# Patient Record
Sex: Female | Born: 1945 | Race: White | Hispanic: No | Marital: Married | Smoking: Former smoker
Health system: Southern US, Community
[De-identification: ages and names within clinical notes are randomized; demographics above are authoritative.]

## PROBLEM LIST (undated history)

## (undated) DIAGNOSIS — K5730 Diverticulosis of large intestine without perforation or abscess without bleeding: Secondary | ICD-10-CM

## (undated) DIAGNOSIS — I10 Essential (primary) hypertension: Secondary | ICD-10-CM

## (undated) DIAGNOSIS — S2249XA Multiple fractures of ribs, unspecified side, initial encounter for closed fracture: Secondary | ICD-10-CM

## (undated) DIAGNOSIS — F329 Major depressive disorder, single episode, unspecified: Secondary | ICD-10-CM

## (undated) DIAGNOSIS — S82891A Other fracture of right lower leg, initial encounter for closed fracture: Secondary | ICD-10-CM

## (undated) DIAGNOSIS — S82892A Other fracture of left lower leg, initial encounter for closed fracture: Secondary | ICD-10-CM

## (undated) DIAGNOSIS — R569 Unspecified convulsions: Secondary | ICD-10-CM

## (undated) DIAGNOSIS — F419 Anxiety disorder, unspecified: Secondary | ICD-10-CM

## (undated) DIAGNOSIS — F32A Depression, unspecified: Secondary | ICD-10-CM

## (undated) DIAGNOSIS — K219 Gastro-esophageal reflux disease without esophagitis: Secondary | ICD-10-CM

## (undated) DIAGNOSIS — M1990 Unspecified osteoarthritis, unspecified site: Secondary | ICD-10-CM

## (undated) HISTORY — PX: ABDOMINAL HYSTERECTOMY: SHX81

## (undated) HISTORY — PX: TUBAL LIGATION: SHX77

## (undated) HISTORY — PX: PARTIAL HYSTERECTOMY: SHX80

## (undated) HISTORY — PX: CHOLECYSTECTOMY: SHX55

---

## 2011-08-29 DIAGNOSIS — I10 Essential (primary) hypertension: Secondary | ICD-10-CM | POA: Diagnosis not present

## 2011-08-29 DIAGNOSIS — E785 Hyperlipidemia, unspecified: Secondary | ICD-10-CM | POA: Diagnosis not present

## 2011-08-29 DIAGNOSIS — R0602 Shortness of breath: Secondary | ICD-10-CM | POA: Diagnosis not present

## 2011-08-29 DIAGNOSIS — F17200 Nicotine dependence, unspecified, uncomplicated: Secondary | ICD-10-CM | POA: Diagnosis not present

## 2011-08-29 DIAGNOSIS — F341 Dysthymic disorder: Secondary | ICD-10-CM | POA: Diagnosis not present

## 2011-10-01 DIAGNOSIS — F341 Dysthymic disorder: Secondary | ICD-10-CM | POA: Diagnosis not present

## 2011-10-01 DIAGNOSIS — N318 Other neuromuscular dysfunction of bladder: Secondary | ICD-10-CM | POA: Diagnosis not present

## 2011-10-01 DIAGNOSIS — I10 Essential (primary) hypertension: Secondary | ICD-10-CM | POA: Diagnosis not present

## 2011-10-01 DIAGNOSIS — R634 Abnormal weight loss: Secondary | ICD-10-CM | POA: Diagnosis not present

## 2011-10-01 DIAGNOSIS — E039 Hypothyroidism, unspecified: Secondary | ICD-10-CM | POA: Diagnosis not present

## 2011-10-15 DIAGNOSIS — F605 Obsessive-compulsive personality disorder: Secondary | ICD-10-CM | POA: Diagnosis not present

## 2011-10-15 DIAGNOSIS — I10 Essential (primary) hypertension: Secondary | ICD-10-CM | POA: Diagnosis not present

## 2011-10-15 DIAGNOSIS — E785 Hyperlipidemia, unspecified: Secondary | ICD-10-CM | POA: Diagnosis not present

## 2012-04-07 DIAGNOSIS — M545 Low back pain: Secondary | ICD-10-CM | POA: Diagnosis not present

## 2012-04-07 DIAGNOSIS — I10 Essential (primary) hypertension: Secondary | ICD-10-CM | POA: Diagnosis not present

## 2012-04-07 DIAGNOSIS — N390 Urinary tract infection, site not specified: Secondary | ICD-10-CM | POA: Diagnosis not present

## 2012-04-07 DIAGNOSIS — B20 Human immunodeficiency virus [HIV] disease: Secondary | ICD-10-CM | POA: Diagnosis not present

## 2012-04-07 DIAGNOSIS — R0602 Shortness of breath: Secondary | ICD-10-CM | POA: Diagnosis not present

## 2012-04-18 DIAGNOSIS — R7309 Other abnormal glucose: Secondary | ICD-10-CM | POA: Diagnosis not present

## 2012-04-24 DIAGNOSIS — N390 Urinary tract infection, site not specified: Secondary | ICD-10-CM | POA: Diagnosis not present

## 2012-04-24 DIAGNOSIS — I10 Essential (primary) hypertension: Secondary | ICD-10-CM | POA: Diagnosis not present

## 2012-05-02 DIAGNOSIS — J984 Other disorders of lung: Secondary | ICD-10-CM | POA: Diagnosis not present

## 2012-05-02 DIAGNOSIS — M47817 Spondylosis without myelopathy or radiculopathy, lumbosacral region: Secondary | ICD-10-CM | POA: Diagnosis not present

## 2012-05-02 DIAGNOSIS — M5137 Other intervertebral disc degeneration, lumbosacral region: Secondary | ICD-10-CM | POA: Diagnosis not present

## 2012-05-02 DIAGNOSIS — R52 Pain, unspecified: Secondary | ICD-10-CM | POA: Diagnosis not present

## 2012-05-02 DIAGNOSIS — Z1231 Encounter for screening mammogram for malignant neoplasm of breast: Secondary | ICD-10-CM | POA: Diagnosis not present

## 2012-05-02 DIAGNOSIS — M25559 Pain in unspecified hip: Secondary | ICD-10-CM | POA: Diagnosis not present

## 2012-05-02 DIAGNOSIS — M1710 Unilateral primary osteoarthritis, unspecified knee: Secondary | ICD-10-CM | POA: Diagnosis not present

## 2012-05-02 DIAGNOSIS — R05 Cough: Secondary | ICD-10-CM | POA: Diagnosis not present

## 2012-05-02 DIAGNOSIS — I10 Essential (primary) hypertension: Secondary | ICD-10-CM | POA: Diagnosis not present

## 2012-05-02 DIAGNOSIS — M25569 Pain in unspecified knee: Secondary | ICD-10-CM | POA: Diagnosis not present

## 2012-07-01 DIAGNOSIS — J449 Chronic obstructive pulmonary disease, unspecified: Secondary | ICD-10-CM | POA: Diagnosis not present

## 2012-07-01 DIAGNOSIS — Z79899 Other long term (current) drug therapy: Secondary | ICD-10-CM | POA: Diagnosis not present

## 2012-07-01 DIAGNOSIS — F329 Major depressive disorder, single episode, unspecified: Secondary | ICD-10-CM | POA: Diagnosis not present

## 2012-07-01 DIAGNOSIS — Z5181 Encounter for therapeutic drug level monitoring: Secondary | ICD-10-CM | POA: Diagnosis not present

## 2012-07-01 DIAGNOSIS — J329 Chronic sinusitis, unspecified: Secondary | ICD-10-CM | POA: Diagnosis not present

## 2012-10-02 DIAGNOSIS — K089 Disorder of teeth and supporting structures, unspecified: Secondary | ICD-10-CM | POA: Diagnosis not present

## 2013-01-15 DIAGNOSIS — Z1329 Encounter for screening for other suspected endocrine disorder: Secondary | ICD-10-CM | POA: Diagnosis not present

## 2013-01-15 DIAGNOSIS — E785 Hyperlipidemia, unspecified: Secondary | ICD-10-CM | POA: Diagnosis not present

## 2013-01-15 DIAGNOSIS — J449 Chronic obstructive pulmonary disease, unspecified: Secondary | ICD-10-CM | POA: Diagnosis not present

## 2013-01-15 DIAGNOSIS — C8589 Other specified types of non-Hodgkin lymphoma, extranodal and solid organ sites: Secondary | ICD-10-CM | POA: Diagnosis not present

## 2013-01-15 DIAGNOSIS — E559 Vitamin D deficiency, unspecified: Secondary | ICD-10-CM | POA: Diagnosis not present

## 2013-01-15 DIAGNOSIS — R5381 Other malaise: Secondary | ICD-10-CM | POA: Diagnosis not present

## 2013-01-15 DIAGNOSIS — I10 Essential (primary) hypertension: Secondary | ICD-10-CM | POA: Diagnosis not present

## 2013-01-20 DIAGNOSIS — D179 Benign lipomatous neoplasm, unspecified: Secondary | ICD-10-CM | POA: Diagnosis not present

## 2013-01-21 DIAGNOSIS — K219 Gastro-esophageal reflux disease without esophagitis: Secondary | ICD-10-CM | POA: Diagnosis not present

## 2013-01-21 DIAGNOSIS — I10 Essential (primary) hypertension: Secondary | ICD-10-CM | POA: Diagnosis not present

## 2013-01-21 DIAGNOSIS — F17200 Nicotine dependence, unspecified, uncomplicated: Secondary | ICD-10-CM | POA: Diagnosis not present

## 2013-01-21 DIAGNOSIS — Z885 Allergy status to narcotic agent status: Secondary | ICD-10-CM | POA: Diagnosis not present

## 2013-01-21 DIAGNOSIS — D1739 Benign lipomatous neoplasm of skin and subcutaneous tissue of other sites: Secondary | ICD-10-CM | POA: Diagnosis not present

## 2013-01-21 DIAGNOSIS — Z9089 Acquired absence of other organs: Secondary | ICD-10-CM | POA: Diagnosis not present

## 2013-01-21 DIAGNOSIS — Z01818 Encounter for other preprocedural examination: Secondary | ICD-10-CM | POA: Diagnosis not present

## 2013-01-21 DIAGNOSIS — Z9079 Acquired absence of other genital organ(s): Secondary | ICD-10-CM | POA: Diagnosis not present

## 2013-01-21 DIAGNOSIS — Z886 Allergy status to analgesic agent status: Secondary | ICD-10-CM | POA: Diagnosis not present

## 2013-01-21 DIAGNOSIS — Z8659 Personal history of other mental and behavioral disorders: Secondary | ICD-10-CM | POA: Diagnosis not present

## 2013-01-23 DIAGNOSIS — F17200 Nicotine dependence, unspecified, uncomplicated: Secondary | ICD-10-CM | POA: Diagnosis not present

## 2013-01-23 DIAGNOSIS — D179 Benign lipomatous neoplasm, unspecified: Secondary | ICD-10-CM | POA: Diagnosis not present

## 2013-01-23 DIAGNOSIS — K219 Gastro-esophageal reflux disease without esophagitis: Secondary | ICD-10-CM | POA: Diagnosis not present

## 2013-01-23 DIAGNOSIS — I10 Essential (primary) hypertension: Secondary | ICD-10-CM | POA: Diagnosis not present

## 2013-01-23 DIAGNOSIS — Z9089 Acquired absence of other organs: Secondary | ICD-10-CM | POA: Diagnosis not present

## 2013-01-23 DIAGNOSIS — Z8659 Personal history of other mental and behavioral disorders: Secondary | ICD-10-CM | POA: Diagnosis not present

## 2013-01-23 DIAGNOSIS — D1739 Benign lipomatous neoplasm of skin and subcutaneous tissue of other sites: Secondary | ICD-10-CM | POA: Diagnosis not present

## 2013-01-23 DIAGNOSIS — D2370 Other benign neoplasm of skin of unspecified lower limb, including hip: Secondary | ICD-10-CM | POA: Diagnosis not present

## 2013-01-23 DIAGNOSIS — D1779 Benign lipomatous neoplasm of other sites: Secondary | ICD-10-CM | POA: Diagnosis not present

## 2013-01-24 DIAGNOSIS — I10 Essential (primary) hypertension: Secondary | ICD-10-CM | POA: Diagnosis not present

## 2013-01-24 DIAGNOSIS — Z9089 Acquired absence of other organs: Secondary | ICD-10-CM | POA: Diagnosis not present

## 2013-01-24 DIAGNOSIS — D1739 Benign lipomatous neoplasm of skin and subcutaneous tissue of other sites: Secondary | ICD-10-CM | POA: Diagnosis not present

## 2013-01-24 DIAGNOSIS — K219 Gastro-esophageal reflux disease without esophagitis: Secondary | ICD-10-CM | POA: Diagnosis not present

## 2013-01-24 DIAGNOSIS — Z8659 Personal history of other mental and behavioral disorders: Secondary | ICD-10-CM | POA: Diagnosis not present

## 2013-01-24 DIAGNOSIS — F17200 Nicotine dependence, unspecified, uncomplicated: Secondary | ICD-10-CM | POA: Diagnosis not present

## 2013-02-18 DIAGNOSIS — I270 Primary pulmonary hypertension: Secondary | ICD-10-CM | POA: Diagnosis not present

## 2013-02-18 DIAGNOSIS — I517 Cardiomegaly: Secondary | ICD-10-CM | POA: Diagnosis not present

## 2013-04-06 DIAGNOSIS — I209 Angina pectoris, unspecified: Secondary | ICD-10-CM | POA: Diagnosis not present

## 2013-04-08 DIAGNOSIS — R079 Chest pain, unspecified: Secondary | ICD-10-CM | POA: Diagnosis not present

## 2013-04-08 DIAGNOSIS — R0602 Shortness of breath: Secondary | ICD-10-CM | POA: Diagnosis not present

## 2013-04-08 DIAGNOSIS — I209 Angina pectoris, unspecified: Secondary | ICD-10-CM | POA: Diagnosis not present

## 2013-04-08 DIAGNOSIS — A499 Bacterial infection, unspecified: Secondary | ICD-10-CM | POA: Diagnosis not present

## 2013-04-09 DIAGNOSIS — I209 Angina pectoris, unspecified: Secondary | ICD-10-CM | POA: Diagnosis not present

## 2013-04-09 DIAGNOSIS — R0602 Shortness of breath: Secondary | ICD-10-CM | POA: Diagnosis not present

## 2013-04-09 DIAGNOSIS — A499 Bacterial infection, unspecified: Secondary | ICD-10-CM | POA: Diagnosis not present

## 2013-04-14 DIAGNOSIS — Z79899 Other long term (current) drug therapy: Secondary | ICD-10-CM | POA: Diagnosis not present

## 2013-04-14 DIAGNOSIS — F17200 Nicotine dependence, unspecified, uncomplicated: Secondary | ICD-10-CM | POA: Diagnosis not present

## 2013-04-14 DIAGNOSIS — I209 Angina pectoris, unspecified: Secondary | ICD-10-CM | POA: Diagnosis not present

## 2013-04-14 DIAGNOSIS — I10 Essential (primary) hypertension: Secondary | ICD-10-CM | POA: Diagnosis not present

## 2013-04-14 DIAGNOSIS — E785 Hyperlipidemia, unspecified: Secondary | ICD-10-CM | POA: Diagnosis not present

## 2013-04-14 DIAGNOSIS — I2510 Atherosclerotic heart disease of native coronary artery without angina pectoris: Secondary | ICD-10-CM | POA: Diagnosis not present

## 2013-04-14 DIAGNOSIS — J449 Chronic obstructive pulmonary disease, unspecified: Secondary | ICD-10-CM | POA: Diagnosis not present

## 2013-04-14 DIAGNOSIS — R079 Chest pain, unspecified: Secondary | ICD-10-CM | POA: Diagnosis not present

## 2013-04-14 DIAGNOSIS — R0602 Shortness of breath: Secondary | ICD-10-CM | POA: Diagnosis not present

## 2013-05-18 DIAGNOSIS — I10 Essential (primary) hypertension: Secondary | ICD-10-CM | POA: Diagnosis not present

## 2013-05-18 DIAGNOSIS — F411 Generalized anxiety disorder: Secondary | ICD-10-CM | POA: Diagnosis not present

## 2013-05-18 DIAGNOSIS — Z23 Encounter for immunization: Secondary | ICD-10-CM | POA: Diagnosis not present

## 2013-06-29 DIAGNOSIS — R0789 Other chest pain: Secondary | ICD-10-CM | POA: Diagnosis not present

## 2013-06-29 DIAGNOSIS — I2510 Atherosclerotic heart disease of native coronary artery without angina pectoris: Secondary | ICD-10-CM | POA: Diagnosis not present

## 2013-08-27 DIAGNOSIS — R300 Dysuria: Secondary | ICD-10-CM | POA: Diagnosis not present

## 2013-08-27 DIAGNOSIS — I10 Essential (primary) hypertension: Secondary | ICD-10-CM | POA: Diagnosis not present

## 2013-08-27 DIAGNOSIS — E785 Hyperlipidemia, unspecified: Secondary | ICD-10-CM | POA: Diagnosis not present

## 2013-08-27 DIAGNOSIS — R5381 Other malaise: Secondary | ICD-10-CM | POA: Diagnosis not present

## 2013-08-27 DIAGNOSIS — E559 Vitamin D deficiency, unspecified: Secondary | ICD-10-CM | POA: Diagnosis not present

## 2013-08-27 DIAGNOSIS — Z1329 Encounter for screening for other suspected endocrine disorder: Secondary | ICD-10-CM | POA: Diagnosis not present

## 2013-10-28 DIAGNOSIS — Z6825 Body mass index (BMI) 25.0-25.9, adult: Secondary | ICD-10-CM | POA: Diagnosis not present

## 2013-10-28 DIAGNOSIS — I10 Essential (primary) hypertension: Secondary | ICD-10-CM | POA: Diagnosis not present

## 2013-10-28 DIAGNOSIS — F329 Major depressive disorder, single episode, unspecified: Secondary | ICD-10-CM | POA: Diagnosis not present

## 2013-10-28 DIAGNOSIS — E785 Hyperlipidemia, unspecified: Secondary | ICD-10-CM | POA: Diagnosis not present

## 2014-06-04 ENCOUNTER — Emergency Department (HOSPITAL_COMMUNITY): Payer: Medicare Other

## 2014-06-04 ENCOUNTER — Emergency Department (HOSPITAL_COMMUNITY)
Admission: EM | Admit: 2014-06-04 | Discharge: 2014-06-04 | Disposition: A | Discharge status: Home / Self Care | Payer: Medicare Other | Attending: Emergency Medicine | Admitting: Emergency Medicine

## 2014-06-04 ENCOUNTER — Encounter (HOSPITAL_COMMUNITY): Payer: Self-pay | Admitting: Emergency Medicine

## 2014-06-04 DIAGNOSIS — W1849XA Other slipping, tripping and stumbling without falling, initial encounter: Secondary | ICD-10-CM | POA: Insufficient documentation

## 2014-06-04 DIAGNOSIS — S62309A Unspecified fracture of unspecified metacarpal bone, initial encounter for closed fracture: Secondary | ICD-10-CM

## 2014-06-04 DIAGNOSIS — I10 Essential (primary) hypertension: Secondary | ICD-10-CM | POA: Diagnosis not present

## 2014-06-04 DIAGNOSIS — Y929 Unspecified place or not applicable: Secondary | ICD-10-CM | POA: Diagnosis not present

## 2014-06-04 DIAGNOSIS — Z720 Tobacco use: Secondary | ICD-10-CM | POA: Diagnosis not present

## 2014-06-04 DIAGNOSIS — S62615A Displaced fracture of proximal phalanx of left ring finger, initial encounter for closed fracture: Secondary | ICD-10-CM | POA: Insufficient documentation

## 2014-06-04 DIAGNOSIS — S62609A Fracture of unspecified phalanx of unspecified finger, initial encounter for closed fracture: Secondary | ICD-10-CM

## 2014-06-04 DIAGNOSIS — Z8719 Personal history of other diseases of the digestive system: Secondary | ICD-10-CM | POA: Diagnosis not present

## 2014-06-04 DIAGNOSIS — M79641 Pain in right hand: Secondary | ICD-10-CM | POA: Insufficient documentation

## 2014-06-04 DIAGNOSIS — S6991XA Unspecified injury of right wrist, hand and finger(s), initial encounter: Secondary | ICD-10-CM | POA: Diagnosis not present

## 2014-06-04 DIAGNOSIS — Y9389 Activity, other specified: Secondary | ICD-10-CM | POA: Insufficient documentation

## 2014-06-04 DIAGNOSIS — Z8739 Personal history of other diseases of the musculoskeletal system and connective tissue: Secondary | ICD-10-CM | POA: Diagnosis not present

## 2014-06-04 DIAGNOSIS — S62614A Displaced fracture of proximal phalanx of right ring finger, initial encounter for closed fracture: Secondary | ICD-10-CM | POA: Diagnosis not present

## 2014-06-04 DIAGNOSIS — M79644 Pain in right finger(s): Secondary | ICD-10-CM | POA: Insufficient documentation

## 2014-06-04 DIAGNOSIS — S62314A Displaced fracture of base of fourth metacarpal bone, right hand, initial encounter for closed fracture: Secondary | ICD-10-CM | POA: Diagnosis not present

## 2014-06-04 DIAGNOSIS — Z8659 Personal history of other mental and behavioral disorders: Secondary | ICD-10-CM | POA: Insufficient documentation

## 2014-06-04 DIAGNOSIS — T1490XA Injury, unspecified, initial encounter: Secondary | ICD-10-CM

## 2014-06-04 HISTORY — DX: Anxiety disorder, unspecified: F41.9

## 2014-06-04 HISTORY — DX: Gastro-esophageal reflux disease without esophagitis: K21.9

## 2014-06-04 HISTORY — DX: Essential (primary) hypertension: I10

## 2014-06-04 HISTORY — DX: Diverticulosis of large intestine without perforation or abscess without bleeding: K57.30

## 2014-06-04 HISTORY — DX: Unspecified osteoarthritis, unspecified site: M19.90

## 2014-06-04 MED ORDER — HYDROCODONE-ACETAMINOPHEN 5-325 MG PO TABS: ORAL_TABLET | ORAL | Status: DC

## 2014-06-04 MED ORDER — HYDROCODONE-ACETAMINOPHEN 5-325 MG PO TABS
1.0000 | ORAL_TABLET | Freq: Once | ORAL | Status: AC
  Administered 2014-06-04: 1 via ORAL
  Filled 2014-06-04: qty 1

## 2014-06-04 NOTE — Discharge Instructions (Signed)
Hand Fracture, Metacarpals °Fractures of metacarpals are breaks in the bones of the hand. They extend from the knuckles to the wrist. These bones can undergo many types of fractures. There are different ways of treating these fractures, all of which may be correct. °TREATMENT  °Hand fractures can be treated with:  °· Non-reduction - The fracture is casted without changing the positions of the fracture (bone pieces) involved. This fracture is usually left in a cast for 4 to 6 weeks or as your caregiver thinks necessary. °· Closed reduction - The bones are moved back into position without surgery and then casted. °· ORIF (open reduction and internal fixation) - The fracture site is opened and the bone pieces are fixed into place with some type of hardware, such as screws, etc. They are then casted. °Your caregiver will discuss the type of fracture you have and the treatment that should be best for that problem. If surgery is chosen, let your caregivers know about the following.  °LET YOUR CAREGIVERS KNOW ABOUT: °· Allergies. °· Medications you are taking, including herbs, eye drops, over the counter medications, and creams. °· Use of steroids (by mouth or creams). °· Previous problems with anesthetics or novocaine. °· Possibility of pregnancy. °· History of blood clots (thrombophlebitis). °· History of bleeding or blood problems. °· Previous surgeries. °· Other health problems. °AFTER THE PROCEDURE °After surgery, you will be taken to the recovery area where a nurse will watch and check your progress. Once you are awake, stable, and taking fluids well, barring other problems, you'll be allowed to go home. Once home, an ice pack applied to your operative site may help with pain and keep the swelling down. °HOME CARE INSTRUCTIONS  °· Follow your caregiver's instructions as to activities, exercises, physical therapy, and driving a car. °· Daily exercise is helpful for keeping range of motion and strength. Exercise as  instructed. °· To lessen swelling, keep the injured hand elevated above the level of your heart as much as possible. °· Apply ice to the injury for 15-20 minutes each hour while awake for the first 2 days. Put the ice in a plastic bag and place a thin towel between the bag of ice and your cast. °· Move the fingers of your casted hand several times a day. °· If a plaster or fiberglass cast was applied: °¨ Do not try to scratch the skin under the cast using a sharp or pointed object. °¨ Check the skin around the cast every day. You may put lotion on red or sore areas. °¨ Keep your cast dry. Your cast can be protected during bathing with a plastic bag. Do not put your cast into the water. °· If a plaster splint was applied: °¨ Wear your splint for as long as directed by your caregiver or until seen again. °¨ Do not get your splint wet. Protect it during bathing with a plastic bag. °¨ You may loosen the elastic bandage around the splint if your fingers start to get numb, tingle, get cold or turn blue. °· Do not put pressure on your cast or splint; this may cause it to break. Especially, do not lean plaster casts on hard surfaces for 24 hours after application. °· Take medications as directed by your caregiver. °· Only take over-the-counter or prescription medicines for pain, discomfort, or fever as directed by your caregiver. °· Follow-up as provided by your caregiver. This is very important in order to avoid permanent injury or disability and chronic   pain. °SEEK MEDICAL CARE IF:  °· Increased bleeding (more than a small spot) from beneath your cast or splint if there is beneath the cast as with an open reduction. °· Redness, swelling, or increasing pain in the wound or from beneath your cast or splint. °· Pus coming from wound or from beneath your cast or splint. °· An unexplained oral temperature above 102° F (38.9° C) develops, or as your caregiver suggests. °· A foul smell coming from the wound or dressing or from  beneath your cast or splint. °· You have a problem moving any of your fingers. °SEEK IMMEDIATE MEDICAL CARE IF:  °· You develop a rash °· You have difficulty breathing °· You have any allergy problems °If you do not have a window in your cast for observing the wound, a discharge or minor bleeding may show up as a stain on the outside of your cast. Report these findings to your caregiver. °MAKE SURE YOU:  °· Understand these instructions. °· Will watch your condition. °· Will get help right away if you are not doing well or get worse. °Document Released: 07/30/2005 Document Revised: 10/22/2011 Document Reviewed: 03/18/2008 °[employer]® Patient Information ©2015 [employer]. This information is not intended to replace advice given to you by your health care provider. Make sure you discuss any questions you have with your health care provider. ° °

## 2014-06-04 NOTE — ED Notes (Signed)
Patient given discharge instruction, verbalized understand. Patient ambulatory out of the department.  
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
## 2014-06-04 NOTE — ED Notes (Signed)
Pt tripped and fell over threshold early today.

## 2014-06-04 NOTE — ED Notes (Signed)
Pt tripped and fell, injured digits 3-5 on rt hand, pt denies injury elsewhere.

## 2014-06-08 DIAGNOSIS — S62324A Displaced fracture of shaft of fourth metacarpal bone, right hand, initial encounter for closed fracture: Secondary | ICD-10-CM | POA: Diagnosis not present

## 2014-06-08 DIAGNOSIS — S62614A Displaced fracture of proximal phalanx of right ring finger, initial encounter for closed fracture: Secondary | ICD-10-CM | POA: Diagnosis not present

## 2014-06-08 NOTE — ED Provider Notes (Signed)
Medical screening examination/treatment/procedure(s) were performed by non-physician practitioner and as supervising physician I was immediately available for consultation/collaboration.   EKG Interpretation None        Maudry Diego, MD 06/08/14 [phone]

## 2014-06-14 DIAGNOSIS — Y929 Unspecified place or not applicable: Secondary | ICD-10-CM | POA: Diagnosis not present

## 2014-06-14 DIAGNOSIS — S62614A Displaced fracture of proximal phalanx of right ring finger, initial encounter for closed fracture: Secondary | ICD-10-CM | POA: Diagnosis not present

## 2014-06-14 DIAGNOSIS — S62324A Displaced fracture of shaft of fourth metacarpal bone, right hand, initial encounter for closed fracture: Secondary | ICD-10-CM | POA: Diagnosis not present

## 2014-06-14 DIAGNOSIS — S62304A Unspecified fracture of fourth metacarpal bone, right hand, initial encounter for closed fracture: Secondary | ICD-10-CM | POA: Diagnosis not present

## 2014-06-14 DIAGNOSIS — X58XXXA Exposure to other specified factors, initial encounter: Secondary | ICD-10-CM | POA: Diagnosis not present

## 2014-06-21 DIAGNOSIS — S62614A Displaced fracture of proximal phalanx of right ring finger, initial encounter for closed fracture: Secondary | ICD-10-CM | POA: Diagnosis not present

## 2014-06-21 DIAGNOSIS — S62324A Displaced fracture of shaft of fourth metacarpal bone, right hand, initial encounter for closed fracture: Secondary | ICD-10-CM | POA: Diagnosis not present

## 2014-06-23 DIAGNOSIS — I10 Essential (primary) hypertension: Secondary | ICD-10-CM | POA: Diagnosis not present

## 2014-06-23 DIAGNOSIS — F17200 Nicotine dependence, unspecified, uncomplicated: Secondary | ICD-10-CM | POA: Diagnosis not present

## 2014-06-23 DIAGNOSIS — Z23 Encounter for immunization: Secondary | ICD-10-CM | POA: Diagnosis not present

## 2014-06-23 DIAGNOSIS — E782 Mixed hyperlipidemia: Secondary | ICD-10-CM | POA: Diagnosis not present

## 2014-06-23 DIAGNOSIS — Z6827 Body mass index (BMI) 27.0-27.9, adult: Secondary | ICD-10-CM | POA: Diagnosis not present

## 2014-07-20 DIAGNOSIS — S62614D Displaced fracture of proximal phalanx of right ring finger, subsequent encounter for fracture with routine healing: Secondary | ICD-10-CM | POA: Diagnosis not present

## 2014-07-20 DIAGNOSIS — S62324D Displaced fracture of shaft of fourth metacarpal bone, right hand, subsequent encounter for fracture with routine healing: Secondary | ICD-10-CM | POA: Diagnosis not present

## 2014-09-14 DIAGNOSIS — Z6828 Body mass index (BMI) 28.0-28.9, adult: Secondary | ICD-10-CM | POA: Diagnosis not present

## 2014-09-14 DIAGNOSIS — R350 Frequency of micturition: Secondary | ICD-10-CM | POA: Diagnosis not present

## 2014-09-14 DIAGNOSIS — J329 Chronic sinusitis, unspecified: Secondary | ICD-10-CM | POA: Diagnosis not present

## 2014-09-14 DIAGNOSIS — E663 Overweight: Secondary | ICD-10-CM | POA: Diagnosis not present

## 2015-01-31 ENCOUNTER — Other Ambulatory Visit (HOSPITAL_COMMUNITY): Payer: Self-pay | Admitting: Family Medicine

## 2015-01-31 DIAGNOSIS — Z6828 Body mass index (BMI) 28.0-28.9, adult: Secondary | ICD-10-CM | POA: Diagnosis not present

## 2015-01-31 DIAGNOSIS — F329 Major depressive disorder, single episode, unspecified: Secondary | ICD-10-CM | POA: Diagnosis not present

## 2015-01-31 DIAGNOSIS — I10 Essential (primary) hypertension: Secondary | ICD-10-CM | POA: Diagnosis not present

## 2015-01-31 DIAGNOSIS — F419 Anxiety disorder, unspecified: Secondary | ICD-10-CM | POA: Diagnosis not present

## 2015-01-31 DIAGNOSIS — E663 Overweight: Secondary | ICD-10-CM | POA: Diagnosis not present

## 2015-02-01 ENCOUNTER — Other Ambulatory Visit (HOSPITAL_COMMUNITY): Payer: Self-pay | Admitting: Family Medicine

## 2015-02-01 DIAGNOSIS — Z1231 Encounter for screening mammogram for malignant neoplasm of breast: Secondary | ICD-10-CM

## 2015-02-02 ENCOUNTER — Other Ambulatory Visit (HOSPITAL_COMMUNITY): Payer: Self-pay | Admitting: Family Medicine

## 2015-02-02 DIAGNOSIS — M8580 Other specified disorders of bone density and structure, unspecified site: Secondary | ICD-10-CM

## 2015-02-09 ENCOUNTER — Ambulatory Visit (HOSPITAL_COMMUNITY)
Admission: RE | Admit: 2015-02-09 | Discharge: 2015-02-09 | Disposition: A | Discharge status: Home / Self Care | Payer: Medicare Other | Source: Ambulatory Visit | Attending: Family Medicine | Admitting: Family Medicine

## 2015-02-09 DIAGNOSIS — M8580 Other specified disorders of bone density and structure, unspecified site: Secondary | ICD-10-CM

## 2015-02-09 DIAGNOSIS — Z780 Asymptomatic menopausal state: Secondary | ICD-10-CM | POA: Insufficient documentation

## 2015-02-09 DIAGNOSIS — R29890 Loss of height: Secondary | ICD-10-CM | POA: Diagnosis not present

## 2015-02-09 DIAGNOSIS — Z1231 Encounter for screening mammogram for malignant neoplasm of breast: Secondary | ICD-10-CM | POA: Diagnosis present

## 2015-02-09 DIAGNOSIS — M898X8 Other specified disorders of bone, other site: Secondary | ICD-10-CM | POA: Diagnosis not present

## 2015-02-09 DIAGNOSIS — M810 Age-related osteoporosis without current pathological fracture: Secondary | ICD-10-CM | POA: Diagnosis not present

## 2015-03-22 DIAGNOSIS — H2513 Age-related nuclear cataract, bilateral: Secondary | ICD-10-CM | POA: Diagnosis not present

## 2015-03-22 DIAGNOSIS — H40033 Anatomical narrow angle, bilateral: Secondary | ICD-10-CM | POA: Diagnosis not present

## 2015-05-06 DIAGNOSIS — E785 Hyperlipidemia, unspecified: Secondary | ICD-10-CM | POA: Diagnosis not present

## 2015-05-06 DIAGNOSIS — F419 Anxiety disorder, unspecified: Secondary | ICD-10-CM | POA: Diagnosis not present

## 2015-05-06 DIAGNOSIS — I10 Essential (primary) hypertension: Secondary | ICD-10-CM | POA: Diagnosis not present

## 2015-05-06 DIAGNOSIS — E663 Overweight: Secondary | ICD-10-CM | POA: Diagnosis not present

## 2015-05-06 DIAGNOSIS — Z1389 Encounter for screening for other disorder: Secondary | ICD-10-CM | POA: Diagnosis not present

## 2015-05-06 DIAGNOSIS — E782 Mixed hyperlipidemia: Secondary | ICD-10-CM | POA: Diagnosis not present

## 2015-05-06 DIAGNOSIS — R739 Hyperglycemia, unspecified: Secondary | ICD-10-CM | POA: Diagnosis not present

## 2015-05-06 DIAGNOSIS — F329 Major depressive disorder, single episode, unspecified: Secondary | ICD-10-CM | POA: Diagnosis not present

## 2015-05-06 DIAGNOSIS — Z6829 Body mass index (BMI) 29.0-29.9, adult: Secondary | ICD-10-CM | POA: Diagnosis not present

## 2015-07-28 DIAGNOSIS — Z1389 Encounter for screening for other disorder: Secondary | ICD-10-CM | POA: Diagnosis not present

## 2015-07-28 DIAGNOSIS — F419 Anxiety disorder, unspecified: Secondary | ICD-10-CM | POA: Diagnosis not present

## 2015-07-28 DIAGNOSIS — Z6830 Body mass index (BMI) 30.0-30.9, adult: Secondary | ICD-10-CM | POA: Diagnosis not present

## 2015-07-28 DIAGNOSIS — I10 Essential (primary) hypertension: Secondary | ICD-10-CM | POA: Diagnosis not present

## 2015-10-26 DIAGNOSIS — Z1389 Encounter for screening for other disorder: Secondary | ICD-10-CM | POA: Diagnosis not present

## 2015-10-26 DIAGNOSIS — E6609 Other obesity due to excess calories: Secondary | ICD-10-CM | POA: Diagnosis not present

## 2015-10-26 DIAGNOSIS — Z6831 Body mass index (BMI) 31.0-31.9, adult: Secondary | ICD-10-CM | POA: Diagnosis not present

## 2015-10-26 DIAGNOSIS — Z0000 Encounter for general adult medical examination without abnormal findings: Secondary | ICD-10-CM | POA: Diagnosis not present

## 2016-01-27 DIAGNOSIS — F419 Anxiety disorder, unspecified: Secondary | ICD-10-CM | POA: Diagnosis not present

## 2016-01-27 DIAGNOSIS — Z6831 Body mass index (BMI) 31.0-31.9, adult: Secondary | ICD-10-CM | POA: Diagnosis not present

## 2016-01-27 DIAGNOSIS — F329 Major depressive disorder, single episode, unspecified: Secondary | ICD-10-CM | POA: Diagnosis not present

## 2016-01-27 DIAGNOSIS — K219 Gastro-esophageal reflux disease without esophagitis: Secondary | ICD-10-CM | POA: Diagnosis not present

## 2016-03-02 DIAGNOSIS — Z681 Body mass index (BMI) 19 or less, adult: Secondary | ICD-10-CM | POA: Diagnosis not present

## 2016-03-02 DIAGNOSIS — Z1389 Encounter for screening for other disorder: Secondary | ICD-10-CM | POA: Diagnosis not present

## 2016-03-02 DIAGNOSIS — R42 Dizziness and giddiness: Secondary | ICD-10-CM | POA: Diagnosis not present

## 2016-03-02 DIAGNOSIS — I10 Essential (primary) hypertension: Secondary | ICD-10-CM | POA: Diagnosis not present

## 2016-05-30 DIAGNOSIS — K047 Periapical abscess without sinus: Secondary | ICD-10-CM | POA: Diagnosis not present

## 2016-05-30 DIAGNOSIS — Z1389 Encounter for screening for other disorder: Secondary | ICD-10-CM | POA: Diagnosis not present

## 2016-05-30 DIAGNOSIS — F419 Anxiety disorder, unspecified: Secondary | ICD-10-CM | POA: Diagnosis not present

## 2016-05-30 DIAGNOSIS — Z6830 Body mass index (BMI) 30.0-30.9, adult: Secondary | ICD-10-CM | POA: Diagnosis not present

## 2016-05-30 DIAGNOSIS — Z23 Encounter for immunization: Secondary | ICD-10-CM | POA: Diagnosis not present

## 2016-05-30 DIAGNOSIS — E782 Mixed hyperlipidemia: Secondary | ICD-10-CM | POA: Diagnosis not present

## 2016-06-01 ENCOUNTER — Inpatient Hospital Stay (HOSPITAL_COMMUNITY): Payer: No Typology Code available for payment source | Admitting: Certified Registered"

## 2016-06-01 ENCOUNTER — Inpatient Hospital Stay (HOSPITAL_COMMUNITY)
Admission: EM | Admit: 2016-06-01 | Discharge: 2016-06-08 | DRG: 493 | Disposition: A | Discharge status: Skilled Nursing Facility | Payer: No Typology Code available for payment source | Attending: General Surgery | Admitting: General Surgery

## 2016-06-01 ENCOUNTER — Emergency Department (HOSPITAL_COMMUNITY): Payer: No Typology Code available for payment source

## 2016-06-01 ENCOUNTER — Inpatient Hospital Stay (HOSPITAL_COMMUNITY): Payer: No Typology Code available for payment source

## 2016-06-01 ENCOUNTER — Encounter (HOSPITAL_COMMUNITY): Payer: Self-pay | Admitting: Vascular Surgery

## 2016-06-01 ENCOUNTER — Encounter (HOSPITAL_COMMUNITY): Admission: EM | Disposition: A | Discharge status: Skilled Nursing Facility | Payer: Self-pay | Source: Home / Self Care

## 2016-06-01 DIAGNOSIS — S82891B Other fracture of right lower leg, initial encounter for open fracture type I or II: Secondary | ICD-10-CM | POA: Diagnosis not present

## 2016-06-01 DIAGNOSIS — F411 Generalized anxiety disorder: Secondary | ICD-10-CM | POA: Diagnosis not present

## 2016-06-01 DIAGNOSIS — Z91030 Bee allergy status: Secondary | ICD-10-CM | POA: Diagnosis not present

## 2016-06-01 DIAGNOSIS — S3993XA Unspecified injury of pelvis, initial encounter: Secondary | ICD-10-CM | POA: Diagnosis not present

## 2016-06-01 DIAGNOSIS — S8262XA Displaced fracture of lateral malleolus of left fibula, initial encounter for closed fracture: Secondary | ICD-10-CM | POA: Diagnosis not present

## 2016-06-01 DIAGNOSIS — S60211A Contusion of right wrist, initial encounter: Secondary | ICD-10-CM | POA: Diagnosis not present

## 2016-06-01 DIAGNOSIS — D62 Acute posthemorrhagic anemia: Secondary | ICD-10-CM | POA: Diagnosis not present

## 2016-06-01 DIAGNOSIS — F419 Anxiety disorder, unspecified: Secondary | ICD-10-CM | POA: Diagnosis present

## 2016-06-01 DIAGNOSIS — S272XXA Traumatic hemopneumothorax, initial encounter: Secondary | ICD-10-CM | POA: Diagnosis not present

## 2016-06-01 DIAGNOSIS — S8992XA Unspecified injury of left lower leg, initial encounter: Secondary | ICD-10-CM | POA: Diagnosis not present

## 2016-06-01 DIAGNOSIS — Z419 Encounter for procedure for purposes other than remedying health state, unspecified: Secondary | ICD-10-CM

## 2016-06-01 DIAGNOSIS — F17210 Nicotine dependence, cigarettes, uncomplicated: Secondary | ICD-10-CM | POA: Diagnosis not present

## 2016-06-01 DIAGNOSIS — S82852B Displaced trimalleolar fracture of left lower leg, initial encounter for open fracture type I or II: Secondary | ICD-10-CM | POA: Diagnosis present

## 2016-06-01 DIAGNOSIS — T148XXA Other injury of unspecified body region, initial encounter: Secondary | ICD-10-CM

## 2016-06-01 DIAGNOSIS — S82892B Other fracture of left lower leg, initial encounter for open fracture type I or II: Secondary | ICD-10-CM | POA: Diagnosis not present

## 2016-06-01 DIAGNOSIS — S2241XA Multiple fractures of ribs, right side, initial encounter for closed fracture: Secondary | ICD-10-CM | POA: Diagnosis not present

## 2016-06-01 DIAGNOSIS — S91002A Unspecified open wound, left ankle, initial encounter: Secondary | ICD-10-CM | POA: Diagnosis present

## 2016-06-01 DIAGNOSIS — S9304XA Dislocation of right ankle joint, initial encounter: Secondary | ICD-10-CM | POA: Diagnosis not present

## 2016-06-01 DIAGNOSIS — S2241XD Multiple fractures of ribs, right side, subsequent encounter for fracture with routine healing: Secondary | ICD-10-CM | POA: Diagnosis not present

## 2016-06-01 DIAGNOSIS — S2231XA Fracture of one rib, right side, initial encounter for closed fracture: Secondary | ICD-10-CM | POA: Diagnosis not present

## 2016-06-01 DIAGNOSIS — S3991XA Unspecified injury of abdomen, initial encounter: Secondary | ICD-10-CM | POA: Diagnosis not present

## 2016-06-01 DIAGNOSIS — R293 Abnormal posture: Secondary | ICD-10-CM | POA: Diagnosis not present

## 2016-06-01 DIAGNOSIS — M6281 Muscle weakness (generalized): Secondary | ICD-10-CM | POA: Diagnosis not present

## 2016-06-01 DIAGNOSIS — S8251XB Displaced fracture of medial malleolus of right tibia, initial encounter for open fracture type I or II: Secondary | ICD-10-CM | POA: Diagnosis not present

## 2016-06-01 DIAGNOSIS — Z885 Allergy status to narcotic agent status: Secondary | ICD-10-CM

## 2016-06-01 DIAGNOSIS — S82851A Displaced trimalleolar fracture of right lower leg, initial encounter for closed fracture: Secondary | ICD-10-CM | POA: Diagnosis not present

## 2016-06-01 DIAGNOSIS — Y92410 Unspecified street and highway as the place of occurrence of the external cause: Secondary | ICD-10-CM | POA: Diagnosis not present

## 2016-06-01 DIAGNOSIS — S8252XA Displaced fracture of medial malleolus of left tibia, initial encounter for closed fracture: Secondary | ICD-10-CM | POA: Diagnosis not present

## 2016-06-01 DIAGNOSIS — S82892A Other fracture of left lower leg, initial encounter for closed fracture: Secondary | ICD-10-CM | POA: Diagnosis not present

## 2016-06-01 DIAGNOSIS — R279 Unspecified lack of coordination: Secondary | ICD-10-CM | POA: Diagnosis not present

## 2016-06-01 DIAGNOSIS — J939 Pneumothorax, unspecified: Secondary | ICD-10-CM | POA: Diagnosis not present

## 2016-06-01 DIAGNOSIS — I10 Essential (primary) hypertension: Secondary | ICD-10-CM | POA: Diagnosis not present

## 2016-06-01 DIAGNOSIS — Z79899 Other long term (current) drug therapy: Secondary | ICD-10-CM | POA: Diagnosis not present

## 2016-06-01 DIAGNOSIS — S82842D Displaced bimalleolar fracture of left lower leg, subsequent encounter for closed fracture with routine healing: Secondary | ICD-10-CM | POA: Diagnosis not present

## 2016-06-01 DIAGNOSIS — S92909A Unspecified fracture of unspecified foot, initial encounter for closed fracture: Secondary | ICD-10-CM | POA: Diagnosis not present

## 2016-06-01 DIAGNOSIS — S82851D Displaced trimalleolar fracture of right lower leg, subsequent encounter for closed fracture with routine healing: Secondary | ICD-10-CM | POA: Diagnosis not present

## 2016-06-01 DIAGNOSIS — S8251XA Displaced fracture of medial malleolus of right tibia, initial encounter for closed fracture: Secondary | ICD-10-CM | POA: Diagnosis not present

## 2016-06-01 DIAGNOSIS — S199XXA Unspecified injury of neck, initial encounter: Secondary | ICD-10-CM | POA: Diagnosis not present

## 2016-06-01 DIAGNOSIS — S82851C Displaced trimalleolar fracture of right lower leg, initial encounter for open fracture type IIIA, IIIB, or IIIC: Principal | ICD-10-CM | POA: Diagnosis present

## 2016-06-01 DIAGNOSIS — S82899A Other fracture of unspecified lower leg, initial encounter for closed fracture: Secondary | ICD-10-CM

## 2016-06-01 DIAGNOSIS — S270XXA Traumatic pneumothorax, initial encounter: Secondary | ICD-10-CM | POA: Diagnosis not present

## 2016-06-01 DIAGNOSIS — S82831A Other fracture of upper and lower end of right fibula, initial encounter for closed fracture: Secondary | ICD-10-CM | POA: Diagnosis not present

## 2016-06-01 DIAGNOSIS — S2239XA Fracture of one rib, unspecified side, initial encounter for closed fracture: Secondary | ICD-10-CM

## 2016-06-01 DIAGNOSIS — S0990XA Unspecified injury of head, initial encounter: Secondary | ICD-10-CM | POA: Diagnosis not present

## 2016-06-01 DIAGNOSIS — T797XXA Traumatic subcutaneous emphysema, initial encounter: Secondary | ICD-10-CM | POA: Diagnosis present

## 2016-06-01 DIAGNOSIS — R262 Difficulty in walking, not elsewhere classified: Secondary | ICD-10-CM | POA: Diagnosis not present

## 2016-06-01 DIAGNOSIS — G8911 Acute pain due to trauma: Secondary | ICD-10-CM

## 2016-06-01 DIAGNOSIS — S82891A Other fracture of right lower leg, initial encounter for closed fracture: Secondary | ICD-10-CM | POA: Diagnosis not present

## 2016-06-01 DIAGNOSIS — S82301A Unspecified fracture of lower end of right tibia, initial encounter for closed fracture: Secondary | ICD-10-CM | POA: Diagnosis not present

## 2016-06-01 DIAGNOSIS — R488 Other symbolic dysfunctions: Secondary | ICD-10-CM | POA: Diagnosis not present

## 2016-06-01 DIAGNOSIS — S82842B Displaced bimalleolar fracture of left lower leg, initial encounter for open fracture type I or II: Secondary | ICD-10-CM | POA: Diagnosis not present

## 2016-06-01 DIAGNOSIS — S301XXA Contusion of abdominal wall, initial encounter: Secondary | ICD-10-CM | POA: Diagnosis not present

## 2016-06-01 DIAGNOSIS — Z4789 Encounter for other orthopedic aftercare: Secondary | ICD-10-CM | POA: Diagnosis not present

## 2016-06-01 DIAGNOSIS — S82391A Other fracture of lower end of right tibia, initial encounter for closed fracture: Secondary | ICD-10-CM | POA: Diagnosis not present

## 2016-06-01 DIAGNOSIS — S82851B Displaced trimalleolar fracture of right lower leg, initial encounter for open fracture type I or II: Secondary | ICD-10-CM | POA: Diagnosis not present

## 2016-06-01 DIAGNOSIS — S8991XA Unspecified injury of right lower leg, initial encounter: Secondary | ICD-10-CM | POA: Diagnosis not present

## 2016-06-01 HISTORY — PX: EXTERNAL FIXATION LEG: SHX1549

## 2016-06-01 HISTORY — PX: I & D EXTREMITY: SHX5045

## 2016-06-01 LAB — CBC
HEMATOCRIT: 30.8 % — AB (ref 36.0–46.0)
Hemoglobin: 10 g/dL — ABNORMAL LOW (ref 12.0–15.0)
MCH: 29.2 pg (ref 26.0–34.0)
MCHC: 32.5 g/dL (ref 30.0–36.0)
MCV: 89.8 fL (ref 78.0–100.0)
Platelets: 225 10*3/uL (ref 150–400)
RBC: 3.43 MIL/uL — ABNORMAL LOW (ref 3.87–5.11)
RDW: 15.5 % (ref 11.5–15.5)
WBC: 6.9 10*3/uL (ref 4.0–10.5)

## 2016-06-01 LAB — COMPREHENSIVE METABOLIC PANEL
ALBUMIN: 2.9 g/dL — AB (ref 3.5–5.0)
ALT: 34 U/L (ref 14–54)
AST: 45 U/L — AB (ref 15–41)
Alkaline Phosphatase: 62 U/L (ref 38–126)
Anion gap: 5 (ref 5–15)
BUN: 13 mg/dL (ref 6–20)
CHLORIDE: 115 mmol/L — AB (ref 101–111)
CO2: 20 mmol/L — ABNORMAL LOW (ref 22–32)
Calcium: 7.5 mg/dL — ABNORMAL LOW (ref 8.9–10.3)
Creatinine, Ser: 1.11 mg/dL — ABNORMAL HIGH (ref 0.44–1.00)
GFR calc Af Amer: 57 mL/min — ABNORMAL LOW (ref >60)
GFR calc non Af Amer: 49 mL/min — ABNORMAL LOW (ref >60)
GLUCOSE: 122 mg/dL — AB (ref 65–99)
POTASSIUM: 3.4 mmol/L — AB (ref 3.5–5.1)
Sodium: 140 mmol/L (ref 135–145)
Total Bilirubin: 0.3 mg/dL (ref 0.3–1.2)
Total Protein: 5.1 g/dL — ABNORMAL LOW (ref 6.5–8.1)

## 2016-06-01 LAB — I-STAT CHEM 8, ED
BUN: 18 mg/dL (ref 6–20)
Calcium, Ion: 1.06 mmol/L — ABNORMAL LOW (ref 1.15–1.40)
Chloride: 112 mmol/L — ABNORMAL HIGH (ref 101–111)
Creatinine, Ser: 1.1 mg/dL — ABNORMAL HIGH (ref 0.44–1.00)
Glucose, Bld: 112 mg/dL — ABNORMAL HIGH (ref 65–99)
HEMATOCRIT: 29 % — AB (ref 36.0–46.0)
HEMOGLOBIN: 9.9 g/dL — AB (ref 12.0–15.0)
Potassium: 3.5 mmol/L (ref 3.5–5.1)
SODIUM: 143 mmol/L (ref 135–145)
TCO2: 22 mmol/L (ref 0–100)

## 2016-06-01 LAB — PREPARE FRESH FROZEN PLASMA
UNIT DIVISION: 0
Unit division: 0

## 2016-06-01 LAB — PROTIME-INR
INR: 1.15
PROTHROMBIN TIME: 14.7 s (ref 11.4–15.2)

## 2016-06-01 LAB — ETHANOL: Alcohol, Ethyl (B): <5 mg/dL (ref <5)

## 2016-06-01 LAB — I-STAT CG4 LACTIC ACID, ED: Lactic Acid, Venous: 1.56 mmol/L (ref 0.5–1.9)

## 2016-06-01 LAB — ABO/RH: ABO/RH(D): B NEG

## 2016-06-01 SURGERY — IRRIGATION AND DEBRIDEMENT EXTREMITY
Anesthesia: General | Site: Leg Lower | Laterality: Right

## 2016-06-01 MED ORDER — SUFENTANIL CITRATE 50 MCG/ML IV SOLN
INTRAVENOUS | Status: DC | PRN
  Administered 2016-06-01: 10 ug via INTRAVENOUS

## 2016-06-01 MED ORDER — PROMETHAZINE HCL 25 MG/ML IJ SOLN: 6.2500 mg | INTRAMUSCULAR | Status: DC | PRN

## 2016-06-01 MED ORDER — ETOMIDATE 2 MG/ML IV SOLN
INTRAVENOUS | Status: DC | PRN
  Administered 2016-06-01: 18 mg via INTRAVENOUS

## 2016-06-01 MED ORDER — KCL IN DEXTROSE-NACL 20-5-0.45 MEQ/L-%-% IV SOLN
INTRAVENOUS | Status: DC
  Administered 2016-06-02: 01:00:00 via INTRAVENOUS
  Administered 2016-06-02: 1000 mL via INTRAVENOUS
  Administered 2016-06-03 – 2016-06-04 (×2): via INTRAVENOUS
  Filled 2016-06-01 (×8): qty 1000

## 2016-06-01 MED ORDER — FENTANYL CITRATE (PF) 100 MCG/2ML IJ SOLN
INTRAMUSCULAR | Status: AC | PRN
  Administered 2016-06-01: 50 ug via INTRAVENOUS

## 2016-06-01 MED ORDER — SUCCINYLCHOLINE CHLORIDE 200 MG/10ML IV SOSY
PREFILLED_SYRINGE | INTRAVENOUS | Status: AC
  Filled 2016-06-01: qty 10

## 2016-06-01 MED ORDER — ENOXAPARIN SODIUM 40 MG/0.4ML ~~LOC~~ SOLN
40.0000 mg | SUBCUTANEOUS | Status: DC
  Administered 2016-06-02 – 2016-06-05 (×4): 40 mg via SUBCUTANEOUS
  Filled 2016-06-01 (×4): qty 0.4

## 2016-06-01 MED ORDER — ALBUMIN HUMAN 5 % IV SOLN
INTRAVENOUS | Status: DC | PRN
  Administered 2016-06-01: 20:00:00 via INTRAVENOUS

## 2016-06-01 MED ORDER — MIDAZOLAM HCL 2 MG/2ML IJ SOLN
INTRAMUSCULAR | Status: AC
  Filled 2016-06-01: qty 2

## 2016-06-01 MED ORDER — LIDOCAINE 2% (20 MG/ML) 5 ML SYRINGE
INTRAMUSCULAR | Status: AC
  Filled 2016-06-01: qty 5

## 2016-06-01 MED ORDER — CEFAZOLIN SODIUM-DEXTROSE 2-4 GM/100ML-% IV SOLN
2.0000 g | Freq: Once | INTRAVENOUS | Status: AC
  Administered 2016-06-01: 2 g via INTRAVENOUS

## 2016-06-01 MED ORDER — SODIUM CHLORIDE 0.9 % IV SOLN
INTRAVENOUS | Status: DC | PRN
  Administered 2016-06-01: 20:00:00 via INTRAVENOUS

## 2016-06-01 MED ORDER — LIDOCAINE 2% (20 MG/ML) 5 ML SYRINGE
INTRAMUSCULAR | Status: DC | PRN
  Administered 2016-06-01: 100 mg via INTRAVENOUS

## 2016-06-01 MED ORDER — DEXTROSE 5 % IV SOLN: INTRAVENOUS | Status: DC | PRN

## 2016-06-01 MED ORDER — LACTATED RINGERS IV SOLN
INTRAVENOUS | Status: DC | PRN
  Administered 2016-06-01 (×2): via INTRAVENOUS

## 2016-06-01 MED ORDER — SODIUM CHLORIDE 0.9 % IJ SOLN
INTRAMUSCULAR | Status: AC
  Filled 2016-06-01: qty 10

## 2016-06-01 MED ORDER — PROPOFOL 10 MG/ML IV BOLUS
INTRAVENOUS | Status: AC
  Filled 2016-06-01: qty 20

## 2016-06-01 MED ORDER — DEXAMETHASONE SODIUM PHOSPHATE 10 MG/ML IJ SOLN
INTRAMUSCULAR | Status: DC | PRN
  Administered 2016-06-01: 10 mg via INTRAVENOUS

## 2016-06-01 MED ORDER — ONDANSETRON HCL 4 MG/2ML IJ SOLN
INTRAMUSCULAR | Status: AC
Start: 2016-06-01 — End: 2016-06-01
  Filled 2016-06-01: qty 2

## 2016-06-01 MED ORDER — FENTANYL CITRATE (PF) 100 MCG/2ML IJ SOLN
INTRAMUSCULAR | Status: AC
  Filled 2016-06-01: qty 2

## 2016-06-01 MED ORDER — FENTANYL CITRATE (PF) 100 MCG/2ML IJ SOLN
25.0000 ug | INTRAMUSCULAR | Status: DC | PRN
  Administered 2016-06-02 – 2016-06-05 (×20): 50 ug via INTRAVENOUS
  Filled 2016-06-01 (×20): qty 2

## 2016-06-01 MED ORDER — GLYCOPYRROLATE 0.2 MG/ML IJ SOLN
INTRAMUSCULAR | Status: DC | PRN
  Administered 2016-06-01: 0.2 mg via INTRAVENOUS

## 2016-06-01 MED ORDER — SUFENTANIL CITRATE 50 MCG/ML IV SOLN
INTRAVENOUS | Status: AC
  Filled 2016-06-01: qty 1

## 2016-06-01 MED ORDER — FENTANYL CITRATE (PF) 100 MCG/2ML IJ SOLN
50.0000 ug | INTRAMUSCULAR | Status: DC | PRN
  Administered 2016-06-01: 50 ug via INTRAVENOUS
  Filled 2016-06-01: qty 2

## 2016-06-01 MED ORDER — DEXAMETHASONE SODIUM PHOSPHATE 10 MG/ML IJ SOLN
INTRAMUSCULAR | Status: AC
  Filled 2016-06-01: qty 1

## 2016-06-01 MED ORDER — CEFAZOLIN IN D5W 1 GM/50ML IV SOLN: 1.0000 g | Freq: Once | INTRAVENOUS | Status: DC

## 2016-06-01 MED ORDER — IOPAMIDOL (ISOVUE-300) INJECTION 61%
100.0000 mL | Freq: Once | INTRAVENOUS | Status: AC | PRN
  Administered 2016-06-01: 100 mL via INTRAVENOUS

## 2016-06-01 MED ORDER — HYDROMORPHONE HCL 2 MG/ML IJ SOLN
0.2500 mg | INTRAMUSCULAR | Status: DC | PRN
  Administered 2016-06-01: 0.25 mg via INTRAVENOUS
  Administered 2016-06-01: 0.5 mg via INTRAVENOUS
  Administered 2016-06-01: 0.25 mg via INTRAVENOUS

## 2016-06-01 MED ORDER — EPHEDRINE 5 MG/ML INJ
INTRAVENOUS | Status: AC
  Filled 2016-06-01: qty 10

## 2016-06-01 MED ORDER — HYDROMORPHONE HCL 2 MG/ML IJ SOLN
INTRAMUSCULAR | Status: AC
  Filled 2016-06-01: qty 1

## 2016-06-01 MED ORDER — SODIUM CHLORIDE 0.9 % IR SOLN
Status: DC | PRN
Start: 2016-06-01 — End: 2016-06-01
  Administered 2016-06-01: 3000 mL

## 2016-06-01 MED ORDER — CEFAZOLIN IN D5W 1 GM/50ML IV SOLN
1.0000 g | Freq: Three times a day (TID) | INTRAVENOUS | Status: DC
  Administered 2016-06-02 – 2016-06-08 (×19): 1 g via INTRAVENOUS
  Filled 2016-06-01 (×22): qty 50

## 2016-06-01 MED ORDER — SUCCINYLCHOLINE CHLORIDE 200 MG/10ML IV SOSY
PREFILLED_SYRINGE | INTRAVENOUS | Status: DC | PRN
  Administered 2016-06-01: 100 mg via INTRAVENOUS

## 2016-06-01 MED ORDER — ONDANSETRON HCL 4 MG/2ML IJ SOLN
4.0000 mg | Freq: Four times a day (QID) | INTRAMUSCULAR | Status: DC | PRN
  Administered 2016-06-02 – 2016-06-03 (×2): 4 mg via INTRAVENOUS
  Filled 2016-06-01 (×3): qty 2

## 2016-06-01 MED ORDER — ONDANSETRON HCL 4 MG/2ML IJ SOLN
INTRAMUSCULAR | Status: DC | PRN
Start: 2016-06-01 — End: 2016-06-01
  Administered 2016-06-01: 4 mg via INTRAVENOUS

## 2016-06-01 MED ORDER — CEFAZOLIN SODIUM 1 G IJ SOLR
INTRAMUSCULAR | Status: DC | PRN
  Administered 2016-06-01: 1 g via INTRAMUSCULAR

## 2016-06-01 MED ORDER — EPHEDRINE SULFATE 50 MG/ML IJ SOLN
INTRAMUSCULAR | Status: DC | PRN
  Administered 2016-06-01 (×5): 10 mg via INTRAVENOUS

## 2016-06-01 MED ORDER — ONDANSETRON HCL 4 MG PO TABS: 4.0000 mg | ORAL_TABLET | Freq: Four times a day (QID) | ORAL | Status: DC | PRN

## 2016-06-01 SURGICAL SUPPLY — 84 items
BANDAGE ACE 4X5 VEL STRL LF (GAUZE/BANDAGES/DRESSINGS) ×4 IMPLANT
BANDAGE ACE 6X5 VEL STRL LF (GAUZE/BANDAGES/DRESSINGS) ×8 IMPLANT
BANDAGE ELASTIC 3 VELCRO ST LF (GAUZE/BANDAGES/DRESSINGS) IMPLANT
BLADE SURG 10 STRL SS (BLADE) IMPLANT
BNDG COHESIVE 1X5 TAN STRL LF (GAUZE/BANDAGES/DRESSINGS) IMPLANT
BNDG COHESIVE 4X5 TAN STRL (GAUZE/BANDAGES/DRESSINGS) IMPLANT
BNDG COHESIVE 6X5 TAN STRL LF (GAUZE/BANDAGES/DRESSINGS) ×8 IMPLANT
BNDG CONFORM 3 STRL LF (GAUZE/BANDAGES/DRESSINGS) IMPLANT
BNDG GAUZE ELAST 4 BULKY (GAUZE/BANDAGES/DRESSINGS) ×8 IMPLANT
BNDG GAUZE STRTCH 6 (GAUZE/BANDAGES/DRESSINGS) IMPLANT
CORDS BIPOLAR (ELECTRODE) IMPLANT
COVER SURGICAL LIGHT HANDLE (MISCELLANEOUS) ×4 IMPLANT
CUFF TOURNIQUET SINGLE 24IN (TOURNIQUET CUFF) IMPLANT
CUFF TOURNIQUET SINGLE 34IN LL (TOURNIQUET CUFF) ×8 IMPLANT
CUFF TOURNIQUET SINGLE 44IN (TOURNIQUET CUFF) IMPLANT
DRAPE C-ARM 42X72 X-RAY (DRAPES) ×4 IMPLANT
DRAPE C-ARMOR (DRAPES) ×4 IMPLANT
DRAPE EXTREMITY BILATERAL (DRAPES) ×4 IMPLANT
DRAPE IMP U-DRAPE 54X76 (DRAPES) ×4 IMPLANT
DRAPE INCISE IOBAN 66X45 STRL (DRAPES) IMPLANT
DRAPE SURG 17X23 STRL (DRAPES) ×4 IMPLANT
DRAPE U-SHAPE 47X51 STRL (DRAPES) ×4 IMPLANT
DRSG PAD ABDOMINAL 8X10 ST (GAUZE/BANDAGES/DRESSINGS) ×12 IMPLANT
DURAPREP 26ML APPLICATOR (WOUND CARE) ×4 IMPLANT
ELECT CAUTERY BLADE 6.4 (BLADE) IMPLANT
ELECT REM PT RETURN 9FT ADLT (ELECTROSURGICAL)
ELECTRODE REM PT RTRN 9FT ADLT (ELECTROSURGICAL) IMPLANT
FACESHIELD WRAPAROUND (MASK) ×4 IMPLANT
GAUZE SPONGE 4X4 12PLY STRL (GAUZE/BANDAGES/DRESSINGS) ×8 IMPLANT
GAUZE XEROFORM 1X8 LF (GAUZE/BANDAGES/DRESSINGS) ×4 IMPLANT
GAUZE XEROFORM 5X9 LF (GAUZE/BANDAGES/DRESSINGS) ×4 IMPLANT
GLOVE BIO SURGEON STRL SZ 6.5 (GLOVE) ×3 IMPLANT
GLOVE BIO SURGEONS STRL SZ 6.5 (GLOVE) ×1
GLOVE BIOGEL PI IND STRL 6.5 (GLOVE) ×4 IMPLANT
GLOVE BIOGEL PI INDICATOR 6.5 (GLOVE) ×4
GLOVE SKINSENSE NS SZ7.5 (GLOVE) ×8
GLOVE SKINSENSE STRL SZ7.5 (GLOVE) ×8 IMPLANT
GLOVE SURG SYN 7.5  E (GLOVE) ×4
GLOVE SURG SYN 7.5 E (GLOVE) ×4 IMPLANT
GOWN STRL REIN XL XLG (GOWN DISPOSABLE) ×12 IMPLANT
HANDPIECE INTERPULSE COAX TIP (DISPOSABLE) ×2
IV NS IRRIG 3000ML ARTHROMATIC (IV SOLUTION) ×8 IMPLANT
KIT BASIN OR (CUSTOM PROCEDURE TRAY) ×4 IMPLANT
KIT ROOM TURNOVER OR (KITS) ×4 IMPLANT
MANIFOLD NEPTUNE II (INSTRUMENTS) ×4 IMPLANT
NS IRRIG 1000ML POUR BTL (IV SOLUTION) ×8 IMPLANT
PACK ORTHO EXTREMITY (CUSTOM PROCEDURE TRAY) ×4 IMPLANT
PAD ABD 8X10 STRL (GAUZE/BANDAGES/DRESSINGS) ×4 IMPLANT
PAD ARMBOARD 7.5X6 YLW CONV (MISCELLANEOUS) ×8 IMPLANT
PAD CAST 4YDX4 CTTN HI CHSV (CAST SUPPLIES) ×2 IMPLANT
PADDING CAST ABS 4INX4YD NS (CAST SUPPLIES)
PADDING CAST ABS COTTON 4X4 ST (CAST SUPPLIES) IMPLANT
PADDING CAST COTTON 4X4 STRL (CAST SUPPLIES) ×2
PADDING CAST COTTON 6X4 STRL (CAST SUPPLIES) ×4 IMPLANT
PIN APEX 5X180MM EXFIX (EXFIX) ×8 IMPLANT
PIN TO ROD COUPLING EXFIX (EXFIX) ×8 IMPLANT
PIN TO ROD COUPLING INVERT (EXFIX) ×16 IMPLANT
PIN TRANSFIXING EXFIX (EXFIX) ×4 IMPLANT
ROD HOFFMANN3 CONNECT 11X150 (EXFIX) ×8 IMPLANT
ROD HOFFMANN3 CONNECT 11X350 (EXFIX) ×8 IMPLANT
ROD SEMI CIRCULAR EXFIX (EXFIX) ×4 IMPLANT
ROD TO ROD COUPLING EXFIX (EXFIX) ×16 IMPLANT
SET CYSTO W/LG BORE CLAMP LF (SET/KITS/TRAYS/PACK) ×4 IMPLANT
SET HNDPC FAN SPRY TIP SCT (DISPOSABLE) ×2 IMPLANT
SPLINT PLASTER CAST XFAST 4X15 (CAST SUPPLIES) ×2 IMPLANT
SPLINT PLASTER XTRA FAST SET 4 (CAST SUPPLIES) ×2
SPONGE LAP 18X18 X RAY DECT (DISPOSABLE) ×8 IMPLANT
STOCKINETTE IMPERVIOUS 9X36 MD (GAUZE/BANDAGES/DRESSINGS) ×8 IMPLANT
SUT ETHILON 2 0 FS 18 (SUTURE) IMPLANT
SUT ETHILON 2 0 PSLX (SUTURE) IMPLANT
SUT ETHILON 3 0 PS 1 (SUTURE) ×16 IMPLANT
SUT VIC AB 2-0 CT1 36 (SUTURE) ×4 IMPLANT
SUT VIC AB 2-0 FS1 27 (SUTURE) ×8 IMPLANT
SYR CONTROL 10ML LL (SYRINGE) IMPLANT
TOWEL OR 17X24 6PK STRL BLUE (TOWEL DISPOSABLE) ×12 IMPLANT
TOWEL OR 17X26 10 PK STRL BLUE (TOWEL DISPOSABLE) ×4 IMPLANT
TUBE ANAEROBIC SPECIMEN COL (MISCELLANEOUS) IMPLANT
TUBE CONNECTING 12'X1/4 (SUCTIONS) ×1
TUBE CONNECTING 12X1/4 (SUCTIONS) ×3 IMPLANT
TUBE FEEDING 5FR 15 INCH (TUBING) IMPLANT
TUBING CYSTO DISP (UROLOGICAL SUPPLIES) IMPLANT
UNDERPAD 30X30 (UNDERPADS AND DIAPERS) ×8 IMPLANT
WATER STERILE IRR 1000ML POUR (IV SOLUTION) ×4 IMPLANT
YANKAUER SUCT BULB TIP NO VENT (SUCTIONS) ×4 IMPLANT

## 2016-06-01 NOTE — Op Note (Signed)
   Date of Surgery: 06/01/2016  INDICATIONS: Ms. Todisco is a 70 y.o.-year-old female with a bilateral open ankle fracture with gross instability of the right ankle fracture;  The patient did consent to the procedure after discussion of the risks and benefits.  PREOPERATIVE DIAGNOSIS: 1. Type 3a trimalleolar ankle fx, right 2. Type 2 bimalleolar ankle fx, left  POSTOPERATIVE DIAGNOSIS: Same.  PROCEDURE: 1. Irrigation and debridement of bilateral open ankle fractures 2. Application of external fixator of right ankle fracture 3. Complex wound repair right ankle 10 cm 4. Complex wound repair left ankle 3 cm  SURGEON: N. Eduard Roux, M.D.  ASSIST: none.  ANESTHESIA:  general  IV FLUIDS AND URINE: See anesthesia.  ESTIMATED BLOOD LOSS: minimal mL.  IMPLANTS: [employer]ker ex fix  DRAINS: none  COMPLICATIONS: None.  DESCRIPTION OF PROCEDURE: The patient was brought to the operating room and placed supine on the operating table.  The patient had been signed prior to the procedure and this was documented. The patient had the anesthesia placed by the anesthesiologist.  A time-out was performed to confirm that this was the correct patient, site, side and location. The patient did receive antibiotics prior to the incision and was re-dosed during the procedure as needed at indicated intervals.  A tourniquet was placed on the right upper thigh.  The patient had the operative extremities prepped and draped in the standard surgical fashion.    We first performed sharp excisional debridement of bone, skin, subcutaneous tissue associated with the open fractures for both ankles.  After debridement, the wound were thoroughly irrigated.  Complex wound repair was then performed on each traumatic wound with 3.0 nylon sutures without tension on the skin.  The right ankle was grossly unstable and was not amenable to splinting.  Therefore the decision was made to place a delta frame with 2 schanz pins in the  tibia and 1 transverse pin through the calcaneus using fluoroscopy.  The external fixator was then built and with the fracture reduced under fluoroscopy, the clamps were tightened down.  Final xrays were taken.  The incisions were dressed sterilely.  Patient tolerated the procedure well.    POSTOPERATIVE PLAN: she will need CT scans of both of her ankles.  She'll need to return for definitive fixation in a staged fashion  N. Eduard Roux, MD [place] 10:30 PM

## 2016-06-01 NOTE — Anesthesia Preprocedure Evaluation (Signed)
Anesthesia Evaluation  Patient identified by MRN, date of birth, ID band Patient confused    Reviewed: Allergy & Precautions, NPO status , Patient's Chart, lab work & pertinent test results  Airway Mallampati: II  TM Distance: <3 FB Neck ROM: Limited    Dental  (+) Teeth Intact, Missing   Pulmonary Current Smoker,     + decreased breath sounds      Cardiovascular hypertension, (-) Cardiac Defibrillator  Rhythm:Regular Rate:Normal     Neuro/Psych    GI/Hepatic negative GI ROS, Neg liver ROS,   Endo/Other  negative endocrine ROS  Renal/GU negative Renal ROS     Musculoskeletal   Abdominal   Peds  Hematology   Anesthesia Other Findings   Reproductive/Obstetrics                             Anesthesia Physical Anesthesia Plan  ASA: III and emergent  Anesthesia Plan: General   Post-op Pain Management:    Induction: Intravenous  Airway Management Planned: Oral ETT  Additional Equipment:   Intra-op Plan:   Post-operative Plan: Extubation in OR  Informed Consent: I have reviewed the patients History and Physical, chart, labs and discussed the procedure including the risks, benefits and alternatives for the proposed anesthesia with the patient or authorized representative who has indicated his/her understanding and acceptance.   Dental advisory given  Plan Discussed with:   Anesthesia Plan Comments:         Anesthesia Quick Evaluation

## 2016-06-01 NOTE — Transfer of Care (Signed)
Immediate Anesthesia Transfer of Care Note  Patient: Brittney Williams  Procedure(s) Performed: Procedure(s) with comments: IRRIGATION AND DEBRIDEMENT of  Bilateral ANKLES (Bilateral) - IRRIGATION AND DEBRIDEMENT of  Bilateral ANKLES EXTERNAL FIXATION LEG (Right) - EXTERNAL FIXATION LEG  Patient Location: PACU  Anesthesia Type:General  Level of Consciousness: sedated, pateint uncooperative, confused and responds to stimulation  Airway & Oxygen Therapy: Patient Spontanous Breathing and Patient connected to face mask oxygen  Post-op Assessment: Report given to RN, Post -op Vital signs reviewed and stable and Patient moving all extremities X 4  Post vital signs: Reviewed and stable  Last Vitals:  Vitals:   06/01/16 1846 06/01/16 1915  BP: 99/73 (!) 98/37  Pulse: 62 (!) 58  Resp: 17 17  Temp:      Last Pain:  Vitals:   06/01/16 1820  PainSc: 4          Complications: No apparent anesthesia complications
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
## 2016-06-01 NOTE — Consult Note (Signed)
Was not able to talk to patient, spoke with Daughter, no prayer was requested, ministry of presence, spent a few minutes with daughter until she was able to go back and be with patient, patient was then taken for CT scan.

## 2016-06-01 NOTE — Anesthesia Procedure Notes (Signed)
Procedure Name: Intubation Date/Time: 06/01/2016 7:53 PM Performed by: Claris Che Pre-anesthesia Checklist: Patient identified, Emergency Drugs available, Suction available, Patient being monitored and Timeout performed Patient Re-evaluated:Patient Re-evaluated prior to inductionOxygen Delivery Method: Circle system utilized Preoxygenation: Pre-oxygenation with 100% oxygen Intubation Type: IV induction, Rapid sequence and Cricoid Pressure applied Laryngoscope Size: Mac and 3 Grade View: Grade III Tube type: Oral Tube size: 7.5 mm Number of attempts: 1 Airway Equipment and Method: Stylet Placement Confirmation: ETT inserted through vocal cords under direct vision,  positive ETCO2 and breath sounds checked- equal and bilateral Secured at: 22 cm Tube secured with: Tape Dental Injury: Teeth and Oropharynx as per pre-operative assessment

## 2016-06-01 NOTE — ED Notes (Signed)
Wedding ring given to [employer]. Family.

## 2016-06-01 NOTE — Progress Notes (Signed)
Orthopedic Tech Progress Note Patient Details:  Brittney Williams August 14, 1945 [id number]  Ortho Devices Type of Ortho Device: Ace wrap, Stirrup splint, Post (short leg) splint Ortho Device/Splint Location: (B) LE Ortho Device/Splint Interventions: Ordered, Application   Braulio Bosch 06/01/2016, 5:59 PM
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
## 2016-06-01 NOTE — ED Notes (Signed)
RN transported patient to CT.

## 2016-06-01 NOTE — Anesthesia Postprocedure Evaluation (Signed)
Anesthesia Post Note  Patient: Brittney Williams  Procedure(s) Performed: Procedure(s) (LRB): IRRIGATION AND DEBRIDEMENT of  Bilateral ANKLES (Bilateral) EXTERNAL FIXATION LEG (Right)  Patient location during evaluation: PACU Anesthesia Type: General Level of consciousness: awake and sedated Pain management: pain level controlled Vital Signs Assessment: post-procedure vital signs reviewed and stable Respiratory status: spontaneous breathing, nonlabored ventilation, respiratory function stable and patient connected to nasal cannula oxygen Cardiovascular status: blood pressure returned to baseline and stable Postop Assessment: no signs of nausea or vomiting Anesthetic complications: no    Last Vitals:  Vitals:   06/01/16 2300 06/01/16 2308  BP: (!) 134/50 (P) 121/72  Pulse: 72 82  Resp: 10 16  Temp:  (P) 36.3 C    Last Pain:  Vitals:   06/01/16 2250  PainSc: 8                  Jonice Cerra,JAMES TERRILL

## 2016-06-01 NOTE — ED Notes (Signed)
Orthopedist MD at bedside. 
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
## 2016-06-02 ENCOUNTER — Inpatient Hospital Stay (HOSPITAL_COMMUNITY): Payer: No Typology Code available for payment source

## 2016-06-02 ENCOUNTER — Encounter (HOSPITAL_COMMUNITY): Payer: Self-pay | Admitting: Orthopaedic Surgery

## 2016-06-02 LAB — URINALYSIS, ROUTINE W REFLEX MICROSCOPIC
BILIRUBIN URINE: NEGATIVE
GLUCOSE, UA: NEGATIVE mg/dL
KETONES UR: NEGATIVE mg/dL
Nitrite: NEGATIVE
PH: 6 (ref 5.0–8.0)
Protein, ur: NEGATIVE mg/dL
SPECIFIC GRAVITY, URINE: 1.026 (ref 1.005–1.030)

## 2016-06-02 LAB — CBC
HEMATOCRIT: 23.6 % — AB (ref 36.0–46.0)
HEMOGLOBIN: 7.6 g/dL — AB (ref 12.0–15.0)
MCH: 29.1 pg (ref 26.0–34.0)
MCHC: 32.2 g/dL (ref 30.0–36.0)
MCV: 90.4 fL (ref 78.0–100.0)
Platelets: 187 10*3/uL (ref 150–400)
RBC: 2.61 MIL/uL — ABNORMAL LOW (ref 3.87–5.11)
RDW: 16 % — ABNORMAL HIGH (ref 11.5–15.5)
WBC: 9.1 10*3/uL (ref 4.0–10.5)

## 2016-06-02 LAB — BASIC METABOLIC PANEL
ANION GAP: 8 (ref 5–15)
BUN: 13 mg/dL (ref 6–20)
CALCIUM: 7.2 mg/dL — AB (ref 8.9–10.3)
CHLORIDE: 112 mmol/L — AB (ref 101–111)
CO2: 18 mmol/L — AB (ref 22–32)
Creatinine, Ser: 1.06 mg/dL — ABNORMAL HIGH (ref 0.44–1.00)
GFR calc non Af Amer: 52 mL/min — ABNORMAL LOW (ref >60)
Glucose, Bld: 179 mg/dL — ABNORMAL HIGH (ref 65–99)
Potassium: 3.8 mmol/L (ref 3.5–5.1)
Sodium: 138 mmol/L (ref 135–145)

## 2016-06-02 LAB — MRSA PCR SCREENING: MRSA BY PCR: NEGATIVE

## 2016-06-02 LAB — URINE MICROSCOPIC-ADD ON

## 2016-06-02 LAB — CDS SEROLOGY

## 2016-06-02 MED ORDER — SODIUM CHLORIDE 0.9% FLUSH
3.0000 mL | Freq: Two times a day (BID) | INTRAVENOUS | Status: DC
  Administered 2016-06-02 – 2016-06-08 (×9): 3 mL via INTRAVENOUS

## 2016-06-02 MED ORDER — FAMOTIDINE IN NACL 20-0.9 MG/50ML-% IV SOLN
20.0000 mg | Freq: Two times a day (BID) | INTRAVENOUS | Status: DC
  Administered 2016-06-02 – 2016-06-06 (×9): 20 mg via INTRAVENOUS
  Filled 2016-06-02 (×11): qty 50

## 2016-06-02 NOTE — Progress Notes (Addendum)
   Subjective:  Patient c/o rib pain.  Ankles are doing fine.  Objective:   VITALS:   Vitals:   06/01/16 2255 06/01/16 2300 06/01/16 2308 06/02/16 0400  BP:  (!) 134/50 121/72 (!) 148/68  Pulse: 77 72 82 79  Resp: 11 10 16 16   Temp:   97.4 F (36.3 C) 97.8 F (36.6 C)  TempSrc:   Oral Axillary  SpO2: 100% 100% 99% 100%  Weight:      Height:        Neurologically intact Neurovascular intact Sensation intact distally Intact pulses distally Dorsiflexion/Plantar flexion intact Incision: dressing C/D/I and no drainage No cellulitis present Compartment soft   Lab Results  Component Value Date   WBC 9.1 06/02/2016   HGB 7.6 (L) 06/02/2016   HCT 23.6 (L) 06/02/2016   MCV 90.4 06/02/2016   PLT 187 06/02/2016     Assessment/Plan:  1 Day Post-Op   - Expected postop acute blood loss anemia - will monitor for symptoms - consider transfusion of RBCs - Up with PT/OT - DVT ppx - SCDs, ambulation, lovenox - NWB bilateral operative extremity - CT scans pending - will need definitive fixation this coming week  Brittney Williams 06/02/2016, 8:48 AM [phone]
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
## 2016-06-03 ENCOUNTER — Inpatient Hospital Stay (HOSPITAL_COMMUNITY): Payer: No Typology Code available for payment source

## 2016-06-03 LAB — CBC
HEMATOCRIT: 22.2 % — AB (ref 36.0–46.0)
Hemoglobin: 7.3 g/dL — ABNORMAL LOW (ref 12.0–15.0)
MCH: 29.8 pg (ref 26.0–34.0)
MCHC: 32.9 g/dL (ref 30.0–36.0)
MCV: 90.6 fL (ref 78.0–100.0)
PLATELETS: 199 10*3/uL (ref 150–400)
RBC: 2.45 MIL/uL — ABNORMAL LOW (ref 3.87–5.11)
RDW: 16.6 % — AB (ref 11.5–15.5)
WBC: 7.3 10*3/uL (ref 4.0–10.5)

## 2016-06-03 LAB — BASIC METABOLIC PANEL
ANION GAP: 8 (ref 5–15)
BUN: 10 mg/dL (ref 6–20)
CALCIUM: 8 mg/dL — AB (ref 8.9–10.3)
CO2: 21 mmol/L — AB (ref 22–32)
Chloride: 112 mmol/L — ABNORMAL HIGH (ref 101–111)
Creatinine, Ser: 0.98 mg/dL (ref 0.44–1.00)
GFR, EST NON AFRICAN AMERICAN: 58 mL/min — AB (ref >60)
Glucose, Bld: 124 mg/dL — ABNORMAL HIGH (ref 65–99)
Potassium: 3.7 mmol/L (ref 3.5–5.1)
Sodium: 141 mmol/L (ref 135–145)

## 2016-06-03 MED ORDER — CEFAZOLIN SODIUM-DEXTROSE 2-4 GM/100ML-% IV SOLN
2.0000 g | INTRAVENOUS | Status: AC
  Administered 2016-06-06 (×2): 2 g via INTRAVENOUS
  Filled 2016-06-03: qty 100

## 2016-06-03 MED ORDER — HYDRALAZINE HCL 20 MG/ML IJ SOLN: 10.0000 mg | Freq: Four times a day (QID) | INTRAMUSCULAR | Status: DC | PRN

## 2016-06-03 MED ORDER — SALINE SPRAY 0.65 % NA SOLN
1.0000 | NASAL | Status: DC | PRN
  Administered 2016-06-04 (×2): 1 via NASAL
  Filled 2016-06-03: qty 44
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
## 2016-06-04 ENCOUNTER — Inpatient Hospital Stay (HOSPITAL_COMMUNITY): Payer: No Typology Code available for payment source

## 2016-06-04 DIAGNOSIS — S82891A Other fracture of right lower leg, initial encounter for closed fracture: Secondary | ICD-10-CM

## 2016-06-04 HISTORY — DX: Other fracture of right lower leg, initial encounter for closed fracture: S82.891A

## 2016-06-04 LAB — CBC
HCT: 29.6 % — ABNORMAL LOW (ref 36.0–46.0)
HEMATOCRIT: 19.8 % — AB (ref 36.0–46.0)
HEMOGLOBIN: 6.4 g/dL — AB (ref 12.0–15.0)
HEMOGLOBIN: 10 g/dL — AB (ref 12.0–15.0)
MCH: 29.2 pg (ref 26.0–34.0)
MCH: 29.5 pg (ref 26.0–34.0)
MCHC: 32.3 g/dL (ref 30.0–36.0)
MCHC: 33.8 g/dL (ref 30.0–36.0)
MCV: 90.4 fL (ref 78.0–100.0)
MCV: 87.3 fL (ref 78.0–100.0)
Platelets: 195 10*3/uL (ref 150–400)
Platelets: 191 10*3/uL (ref 150–400)
RBC: 2.19 MIL/uL — ABNORMAL LOW (ref 3.87–5.11)
RBC: 3.39 MIL/uL — AB (ref 3.87–5.11)
RDW: 16.4 % — AB (ref 11.5–15.5)
RDW: 16.1 % — ABNORMAL HIGH (ref 11.5–15.5)
WBC: 6.4 10*3/uL (ref 4.0–10.5)
WBC: 6.9 10*3/uL (ref 4.0–10.5)

## 2016-06-04 LAB — PREPARE RBC (CROSSMATCH)

## 2016-06-04 MED ORDER — AMLODIPINE BESYLATE 5 MG PO TABS
5.0000 mg | ORAL_TABLET | Freq: Every day | ORAL | Status: DC
  Administered 2016-06-04 – 2016-06-08 (×2): 5 mg via ORAL
  Filled 2016-06-04 (×5): qty 1

## 2016-06-04 MED ORDER — SODIUM CHLORIDE 0.9 % IV SOLN
Freq: Once | INTRAVENOUS | Status: AC
  Administered 2016-06-04: 05:00:00 via INTRAVENOUS

## 2016-06-04 MED ORDER — CLONIDINE HCL 0.1 MG PO TABS
0.1000 mg | ORAL_TABLET | Freq: Three times a day (TID) | ORAL | Status: DC
  Administered 2016-06-04 – 2016-06-08 (×11): 0.1 mg via ORAL
  Filled 2016-06-04 (×15): qty 1

## 2016-06-04 MED ORDER — IRBESARTAN 300 MG PO TABS
300.0000 mg | ORAL_TABLET | Freq: Every day | ORAL | Status: DC
  Administered 2016-06-05 – 2016-06-07 (×3): 300 mg via ORAL
  Filled 2016-06-04 (×5): qty 1

## 2016-06-04 MED ORDER — BUPROPION HCL ER (SR) 150 MG PO TB12
150.0000 mg | ORAL_TABLET | Freq: Two times a day (BID) | ORAL | Status: DC
  Administered 2016-06-04 – 2016-06-08 (×9): 150 mg via ORAL
  Filled 2016-06-04 (×9): qty 1

## 2016-06-04 MED ORDER — AMLODIPINE BESYLATE-VALSARTAN 5-320 MG PO TABS: 1.0000 | ORAL_TABLET | Freq: Every day | ORAL | Status: DC

## 2016-06-04 MED ORDER — METOPROLOL TARTRATE 25 MG PO TABS
25.0000 mg | ORAL_TABLET | Freq: Two times a day (BID) | ORAL | Status: DC
  Administered 2016-06-04 – 2016-06-08 (×9): 25 mg via ORAL
  Filled 2016-06-04 (×9): qty 1

## 2016-06-04 MED ORDER — SERTRALINE HCL 100 MG PO TABS
100.0000 mg | ORAL_TABLET | Freq: Every day | ORAL | Status: DC
  Administered 2016-06-04 – 2016-06-08 (×5): 100 mg via ORAL
  Filled 2016-06-04 (×5): qty 1
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
## 2016-06-04 NOTE — Progress Notes (Signed)
CRITICAL VALUE ALERT  Critical value received: Hemoglobin 6.4  Date of notification:  06/04/2016  Time of notification:  0355  Critical value read back: Yes  Nurse who received alert:  L.Chriss Czar, RN  MD notified (1st page): Dr. Jacinto Reap.Grandville Silos  Time of first page:   [phone]  Responding MD: Dr. Grandville Silos  Time MD responded: [phone]

## 2016-06-05 DIAGNOSIS — T797XXA Traumatic subcutaneous emphysema, initial encounter: Secondary | ICD-10-CM | POA: Diagnosis present

## 2016-06-05 DIAGNOSIS — D62 Acute posthemorrhagic anemia: Secondary | ICD-10-CM | POA: Diagnosis not present

## 2016-06-05 DIAGNOSIS — S2241XA Multiple fractures of ribs, right side, initial encounter for closed fracture: Secondary | ICD-10-CM | POA: Diagnosis present

## 2016-06-05 LAB — BASIC METABOLIC PANEL
Anion gap: 7 (ref 5–15)
BUN: 7 mg/dL (ref 6–20)
CALCIUM: 8.1 mg/dL — AB (ref 8.9–10.3)
CHLORIDE: 106 mmol/L (ref 101–111)
CO2: 24 mmol/L (ref 22–32)
CREATININE: 0.82 mg/dL (ref 0.44–1.00)
GFR calc non Af Amer: >60 mL/min (ref >60)
Glucose, Bld: 129 mg/dL — ABNORMAL HIGH (ref 65–99)
Potassium: 3.8 mmol/L (ref 3.5–5.1)
Sodium: 137 mmol/L (ref 135–145)

## 2016-06-05 LAB — TYPE AND SCREEN
ABO/RH(D): B NEG
Antibody Screen: NEGATIVE
UNIT DIVISION: 0
Unit division: 0
Unit division: 0
Unit division: 0

## 2016-06-05 LAB — CBC
HCT: 27.2 % — ABNORMAL LOW (ref 36.0–46.0)
Hemoglobin: 9.1 g/dL — ABNORMAL LOW (ref 12.0–15.0)
MCH: 28.9 pg (ref 26.0–34.0)
MCHC: 33.5 g/dL (ref 30.0–36.0)
MCV: 86.3 fL (ref 78.0–100.0)
PLATELETS: 205 10*3/uL (ref 150–400)
RBC: 3.15 MIL/uL — ABNORMAL LOW (ref 3.87–5.11)
RDW: 16 % — AB (ref 11.5–15.5)
WBC: 6.2 10*3/uL (ref 4.0–10.5)

## 2016-06-05 MED ORDER — OXYCODONE HCL 5 MG PO TABS
5.0000 mg | ORAL_TABLET | ORAL | Status: DC | PRN
  Administered 2016-06-05 (×2): 10 mg via ORAL
  Administered 2016-06-06 – 2016-06-08 (×6): 15 mg via ORAL
  Filled 2016-06-05 (×4): qty 3
  Filled 2016-06-05: qty 2
  Filled 2016-06-05 (×2): qty 3
  Filled 2016-06-05: qty 2

## 2016-06-05 MED ORDER — HYDROMORPHONE HCL 1 MG/ML IJ SOLN: 0.5000 mg | INTRAMUSCULAR | Status: DC | PRN

## 2016-06-05 MED ORDER — HYDROMORPHONE HCL 2 MG/ML IJ SOLN
0.5000 mg | INTRAMUSCULAR | Status: DC | PRN
  Administered 2016-06-06 – 2016-06-07 (×3): 0.5 mg via INTRAVENOUS
  Filled 2016-06-05 (×3): qty 1

## 2016-06-05 NOTE — Care Management Important Message (Signed)
Important Message  Patient Details  Name: Brittney Williams MRN: [id number] Date of Birth: 1945-11-05   Medicare Important Message Given:       Orbie Pyo 06/05/2016, 1:23 PM
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
## 2016-06-05 NOTE — Progress Notes (Signed)
   Subjective:  Patient reports pain as mild.  Objective:   VITALS:   Vitals:   06/05/16 0000 06/05/16 0014 06/05/16 0400 06/05/16 0730  BP: (!) 119/41  124/62 (!) 128/53  Pulse:    68  Resp: (!) 25  17 17   Temp:  98.2 F (36.8 C) 98.6 F (37 C) 98.1 F (36.7 C)  TempSrc:  Oral Oral Oral  SpO2: 92%  97% 95%  Weight:      Height:        Exam stable Swelling stable   Lab Results  Component Value Date   WBC 6.2 06/05/2016   HGB 9.1 (L) 06/05/2016   HCT 27.2 (L) 06/05/2016   MCV 86.3 06/05/2016   PLT 205 06/05/2016     Assessment/Plan:  4 Days Post-Op   - NPO after midnight - plan for ORIF tomorrow of both ankles  Eduard Roux 06/05/2016, 7:58 AM [phone]
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
## 2016-06-05 NOTE — H&P (Signed)

## 2016-06-06 ENCOUNTER — Inpatient Hospital Stay (HOSPITAL_COMMUNITY): Payer: No Typology Code available for payment source | Admitting: Certified Registered Nurse Anesthetist

## 2016-06-06 ENCOUNTER — Inpatient Hospital Stay (HOSPITAL_COMMUNITY): Payer: No Typology Code available for payment source

## 2016-06-06 ENCOUNTER — Encounter (HOSPITAL_COMMUNITY): Admission: EM | Disposition: A | Discharge status: Skilled Nursing Facility | Payer: Self-pay | Source: Home / Self Care

## 2016-06-06 ENCOUNTER — Encounter (HOSPITAL_COMMUNITY): Payer: Self-pay | Admitting: Certified Registered Nurse Anesthetist

## 2016-06-06 DIAGNOSIS — S82842B Displaced bimalleolar fracture of left lower leg, initial encounter for open fracture type I or II: Secondary | ICD-10-CM

## 2016-06-06 DIAGNOSIS — S82851B Displaced trimalleolar fracture of right lower leg, initial encounter for open fracture type I or II: Secondary | ICD-10-CM

## 2016-06-06 HISTORY — PX: EXTERNAL FIXATION REMOVAL: SHX5040

## 2016-06-06 HISTORY — PX: ORIF ANKLE FRACTURE: SHX5408

## 2016-06-06 LAB — CBC
HEMATOCRIT: 29 % — AB (ref 36.0–46.0)
Hemoglobin: 9.6 g/dL — ABNORMAL LOW (ref 12.0–15.0)
MCH: 28.7 pg (ref 26.0–34.0)
MCHC: 33.1 g/dL (ref 30.0–36.0)
MCV: 86.8 fL (ref 78.0–100.0)
PLATELETS: 246 10*3/uL (ref 150–400)
RBC: 3.34 MIL/uL — AB (ref 3.87–5.11)
RDW: 15.4 % (ref 11.5–15.5)
WBC: 6.4 10*3/uL (ref 4.0–10.5)

## 2016-06-06 LAB — BLOOD PRODUCT ORDER (VERBAL) VERIFICATION

## 2016-06-06 SURGERY — OPEN REDUCTION INTERNAL FIXATION (ORIF) ANKLE FRACTURE
Anesthesia: General | Site: Ankle | Laterality: Right

## 2016-06-06 MED ORDER — LIDOCAINE 2% (20 MG/ML) 5 ML SYRINGE
INTRAMUSCULAR | Status: AC
  Filled 2016-06-06: qty 5

## 2016-06-06 MED ORDER — SODIUM CHLORIDE 0.9 % IR SOLN
Status: DC | PRN
  Administered 2016-06-06 (×3): 3000 mL

## 2016-06-06 MED ORDER — ROCURONIUM BROMIDE 100 MG/10ML IV SOLN
INTRAVENOUS | Status: DC | PRN
  Administered 2016-06-06: 20 mg via INTRAVENOUS
  Administered 2016-06-06: 60 mg via INTRAVENOUS

## 2016-06-06 MED ORDER — GLYCOPYRROLATE 0.2 MG/ML IJ SOLN
INTRAMUSCULAR | Status: DC | PRN
  Administered 2016-06-06: 0.2 mg via INTRAVENOUS

## 2016-06-06 MED ORDER — GLYCOPYRROLATE 0.2 MG/ML IV SOSY
PREFILLED_SYRINGE | INTRAVENOUS | Status: AC
  Filled 2016-06-06: qty 3

## 2016-06-06 MED ORDER — LACTATED RINGERS IV SOLN
INTRAVENOUS | Status: DC
  Administered 2016-06-06 (×3): via INTRAVENOUS

## 2016-06-06 MED ORDER — FENTANYL CITRATE (PF) 100 MCG/2ML IJ SOLN
INTRAMUSCULAR | Status: AC
  Administered 2016-06-06: 25 ug via INTRAVENOUS
  Filled 2016-06-06: qty 2

## 2016-06-06 MED ORDER — LIDOCAINE HCL (CARDIAC) 20 MG/ML IV SOLN
INTRAVENOUS | Status: DC | PRN
  Administered 2016-06-06: 75 mg via INTRAVENOUS

## 2016-06-06 MED ORDER — FENTANYL CITRATE (PF) 100 MCG/2ML IJ SOLN
25.0000 ug | INTRAMUSCULAR | Status: DC | PRN
  Administered 2016-06-06 (×2): 25 ug via INTRAVENOUS
  Administered 2016-06-06: 50 ug via INTRAVENOUS

## 2016-06-06 MED ORDER — ENOXAPARIN SODIUM 40 MG/0.4ML ~~LOC~~ SOLN
40.0000 mg | SUBCUTANEOUS | Status: DC
  Administered 2016-06-07 – 2016-06-08 (×2): 40 mg via SUBCUTANEOUS
  Filled 2016-06-06 (×2): qty 0.4

## 2016-06-06 MED ORDER — MEPERIDINE HCL 25 MG/ML IJ SOLN: 6.2500 mg | INTRAMUSCULAR | Status: DC | PRN

## 2016-06-06 MED ORDER — FENTANYL CITRATE (PF) 100 MCG/2ML IJ SOLN
INTRAMUSCULAR | Status: AC
  Filled 2016-06-06: qty 4

## 2016-06-06 MED ORDER — MIDAZOLAM HCL 2 MG/2ML IJ SOLN
INTRAMUSCULAR | Status: AC
  Filled 2016-06-06: qty 2

## 2016-06-06 MED ORDER — METOCLOPRAMIDE HCL 5 MG/ML IJ SOLN: 10.0000 mg | Freq: Once | INTRAMUSCULAR | Status: DC | PRN

## 2016-06-06 MED ORDER — PROPOFOL 10 MG/ML IV BOLUS
INTRAVENOUS | Status: DC | PRN
  Administered 2016-06-06: 100 mg via INTRAVENOUS

## 2016-06-06 MED ORDER — PHENYLEPHRINE HCL 10 MG/ML IJ SOLN
INTRAMUSCULAR | Status: DC | PRN
  Administered 2016-06-06 (×3): 80 ug via INTRAVENOUS

## 2016-06-06 MED ORDER — ONDANSETRON HCL 4 MG/2ML IJ SOLN
INTRAMUSCULAR | Status: DC | PRN
  Administered 2016-06-06: 4 mg via INTRAVENOUS

## 2016-06-06 MED ORDER — PHENYLEPHRINE HCL 10 MG/ML IJ SOLN
INTRAVENOUS | Status: DC | PRN
  Administered 2016-06-06: 25 ug/min via INTRAVENOUS

## 2016-06-06 MED ORDER — SUGAMMADEX SODIUM 200 MG/2ML IV SOLN
INTRAVENOUS | Status: DC | PRN
  Administered 2016-06-06: 200 mg via INTRAVENOUS

## 2016-06-06 MED ORDER — 0.9 % SODIUM CHLORIDE (POUR BTL) OPTIME
TOPICAL | Status: DC | PRN
  Administered 2016-06-06 (×2): 1000 mL

## 2016-06-06 MED ORDER — FENTANYL CITRATE (PF) 100 MCG/2ML IJ SOLN
INTRAMUSCULAR | Status: DC | PRN
  Administered 2016-06-06 (×4): 50 ug via INTRAVENOUS

## 2016-06-06 SURGICAL SUPPLY — 97 items
BANDAGE ACE 4X5 VEL STRL LF (GAUZE/BANDAGES/DRESSINGS) IMPLANT
BANDAGE ACE 6X5 VEL STRL LF (GAUZE/BANDAGES/DRESSINGS) IMPLANT
BANDAGE ESMARK 6X9 LF (GAUZE/BANDAGES/DRESSINGS) IMPLANT
BIT DRILL 2.5X110 QC LCP DISP (BIT) ×4 IMPLANT
BIT DRILL CANN 2.7 (BIT) ×1
BIT DRILL CANN 2.7MM (BIT) ×1
BIT DRILL SRG 2.7XCANN AO CPLG (BIT) ×2 IMPLANT
BIT DRL SRG 2.7XCANN AO CPLNG (BIT) ×2
BLADE SURG 15 STRL LF DISP TIS (BLADE) ×6 IMPLANT
BLADE SURG 15 STRL SS (BLADE) ×6
BNDG COHESIVE 4X5 TAN STRL (GAUZE/BANDAGES/DRESSINGS) ×4 IMPLANT
BNDG COHESIVE 6X5 TAN STRL LF (GAUZE/BANDAGES/DRESSINGS) IMPLANT
BNDG ELASTIC 6X10 VLCR STRL LF (GAUZE/BANDAGES/DRESSINGS) ×8 IMPLANT
BNDG ESMARK 6X9 LF (GAUZE/BANDAGES/DRESSINGS)
BNDG GAUZE ELAST 4 BULKY (GAUZE/BANDAGES/DRESSINGS) ×16 IMPLANT
CANISTER SUCT 3000ML PPV (MISCELLANEOUS) ×4 IMPLANT
COVER MAYO STAND STRL (DRAPES) ×4 IMPLANT
COVER SURGICAL LIGHT HANDLE (MISCELLANEOUS) ×4 IMPLANT
CUFF TOURNIQUET SINGLE 34IN LL (TOURNIQUET CUFF) ×4 IMPLANT
DRAPE C-ARM 42X72 X-RAY (DRAPES) ×8 IMPLANT
DRAPE C-ARMOR (DRAPES) ×4 IMPLANT
DRAPE IMP U-DRAPE 54X76 (DRAPES) ×8 IMPLANT
DRAPE INCISE IOBAN 66X45 STRL (DRAPES) IMPLANT
DRAPE U-SHAPE 47X51 STRL (DRAPES) ×8 IMPLANT
DRILL 2.6X122MM WL AO SHAFT (BIT) ×4 IMPLANT
DRSG PAD ABDOMINAL 8X10 ST (GAUZE/BANDAGES/DRESSINGS) ×16 IMPLANT
DURAPREP 26ML APPLICATOR (WOUND CARE) ×8 IMPLANT
ELECT CAUTERY BLADE 6.4 (BLADE) ×8 IMPLANT
ELECT REM PT RETURN 9FT ADLT (ELECTROSURGICAL) ×4
ELECTRODE REM PT RTRN 9FT ADLT (ELECTROSURGICAL) ×2 IMPLANT
GAUZE SPONGE 4X4 12PLY STRL (GAUZE/BANDAGES/DRESSINGS) ×8 IMPLANT
GAUZE XEROFORM 5X9 LF (GAUZE/BANDAGES/DRESSINGS) ×8 IMPLANT
GLOVE BIO SURGEON STRL SZ 6.5 (GLOVE) ×3 IMPLANT
GLOVE BIO SURGEONS STRL SZ 6.5 (GLOVE) ×1
GLOVE BIOGEL PI IND STRL 7.5 (GLOVE) ×2 IMPLANT
GLOVE BIOGEL PI INDICATOR 7.5 (GLOVE) ×2
GLOVE SKINSENSE NS SZ7.5 (GLOVE) ×4
GLOVE SKINSENSE STRL SZ7.5 (GLOVE) ×4 IMPLANT
GLOVE SURG SS PI 6.5 STRL IVOR (GLOVE) ×4 IMPLANT
GLOVE SURG SYN 7.5  E (GLOVE) ×14
GLOVE SURG SYN 7.5 E (GLOVE) ×14 IMPLANT
GOWN STRL REIN XL XLG (GOWN DISPOSABLE) ×4 IMPLANT
GOWN STRL REUS W/ TWL LRG LVL3 (GOWN DISPOSABLE) ×4 IMPLANT
GOWN STRL REUS W/TWL LRG LVL3 (GOWN DISPOSABLE) ×4
K-WIRE ORTHOPEDIC 1.4X150L (WIRE) ×12
KIT BASIN OR (CUSTOM PROCEDURE TRAY) ×4 IMPLANT
KIT ROOM TURNOVER OR (KITS) ×4 IMPLANT
KWIRE ORTHOPEDIC 1.4X150L (WIRE) ×6 IMPLANT
NEEDLE HYPO 25GX1X1/2 BEV (NEEDLE) IMPLANT
NS IRRIG 1000ML POUR BTL (IV SOLUTION) ×4 IMPLANT
PACK ORTHO EXTREMITY (CUSTOM PROCEDURE TRAY) ×4 IMPLANT
PAD ARMBOARD 7.5X6 YLW CONV (MISCELLANEOUS) ×8 IMPLANT
PAD CAST 3X4 CTTN HI CHSV (CAST SUPPLIES) ×4 IMPLANT
PADDING CAST COTTON 3X4 STRL (CAST SUPPLIES) ×4
PADDING CAST COTTON 6X4 STRL (CAST SUPPLIES) ×4 IMPLANT
PENCIL BUTTON HOLSTER BLD 10FT (ELECTRODE) ×4 IMPLANT
PLATE 3 HOLE LCP HOOK 3.5MM (Plate) ×4 IMPLANT
PLATE STR 6HOLE (Plate) ×4 IMPLANT
SCREW 3.5X10MM (Screw) ×8 IMPLANT
SCREW BONE 14MMX3.5MM (Screw) ×4 IMPLANT
SCREW BONE NON-LCKING 3.5X12MM (Screw) ×8 IMPLANT
SCREW CANC FT 4.0X35 (Screw) ×4 IMPLANT
SCREW CANN FT 5X2.5X4 44MM (Screw) ×4 IMPLANT
SCREW CANNULATED 4.0 (Screw) ×4 IMPLANT
SCREW CANNULATED 4.0X36MM FT (Screw) ×4 IMPLANT
SCREW CANNULATED 4.0X36MM PT (Screw) ×4 IMPLANT
SCREW CORTEX 3.5 28MM (Screw) ×2 IMPLANT
SCREW CORTEX 3.5 32MM (Screw) ×2 IMPLANT
SCREW CORTEX 3.5 34MM (Screw) ×2 IMPLANT
SCREW FULLY THREADED 4.0X38MM (Screw) ×4 IMPLANT
SCREW LOCK 3.5X10MM (Screw) ×4 IMPLANT
SCREW LOCK CORT ST 3.5X28 (Screw) ×2 IMPLANT
SCREW LOCK CORT ST 3.5X32 (Screw) ×2 IMPLANT
SCREW LOCK CORT ST 3.5X34 (Screw) ×2 IMPLANT
SCREW LOCKING 3.5X16MM (Screw) ×4 IMPLANT
SET CYSTO W/LG BORE CLAMP LF (SET/KITS/TRAYS/PACK) ×8 IMPLANT
SOL PREP POV-IOD 4OZ 10% (MISCELLANEOUS) ×4 IMPLANT
SPLINT FIBERGLASS 3X35 (CAST SUPPLIES) ×8 IMPLANT
SPLINT FIBERGLASS 4X30 (CAST SUPPLIES) ×8 IMPLANT
SPONGE LAP 18X18 X RAY DECT (DISPOSABLE) IMPLANT
SUCTION FRAZIER HANDLE 10FR (MISCELLANEOUS) ×4
SUCTION TUBE FRAZIER 10FR DISP (MISCELLANEOUS) ×4 IMPLANT
SUT ETHILON 3 0 PS 1 (SUTURE) ×24 IMPLANT
SUT VIC AB 2-0 CT1 27 (SUTURE) ×2
SUT VIC AB 2-0 CT1 36 (SUTURE) ×8 IMPLANT
SUT VIC AB 2-0 CT1 TAPERPNT 27 (SUTURE) ×2 IMPLANT
SUT VIC AB 3-0 PS2 18 (SUTURE) ×4
SUT VIC AB 3-0 PS2 18XBRD (SUTURE) ×4 IMPLANT
SYR CONTROL 10ML LL (SYRINGE) IMPLANT
TOWEL OR 17X24 6PK STRL BLUE (TOWEL DISPOSABLE) ×8 IMPLANT
TOWEL OR 17X26 10 PK STRL BLUE (TOWEL DISPOSABLE) ×8 IMPLANT
TUBE CONNECTING 12'X1/4 (SUCTIONS) ×1
TUBE CONNECTING 12X1/4 (SUCTIONS) ×3 IMPLANT
TUBE CONNECTING 20'X1/4 (TUBING) ×1
TUBE CONNECTING 20X1/4 (TUBING) ×3 IMPLANT
WATER STERILE IRR 1000ML POUR (IV SOLUTION) ×4 IMPLANT
YANKAUER SUCT BULB TIP NO VENT (SUCTIONS) ×8 IMPLANT
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
## 2016-06-06 NOTE — Transfer of Care (Signed)
Immediate Anesthesia Transfer of Care Note  Patient: Brittney Williams  Procedure(s) Performed: Procedure(s): OPEN REDUCTION INTERNAL FIXATION (ORIF) BILATERAL ANKLE FRACTURES (Bilateral) REMOVAL EXTERNAL FIXATION RIGHT ANKLE (Right)  Patient Location: PACU  Anesthesia Type:General  Level of Consciousness: awake, sedated and patient cooperative  Airway & Oxygen Therapy: Patient connected to nasal cannula oxygen  Post-op Assessment: Report given to RN and Post -op Vital signs reviewed and stable  Post vital signs: Reviewed and stable  Last Vitals:  Vitals:   06/06/16 1939 06/06/16 1940  BP:  (!) 148/86  Pulse:  92  Resp:  11  Temp: 37 C     Last Pain:  Vitals:   06/06/16 1939  TempSrc:   PainSc: 5       Patients Stated Pain Goal: 3 ([id number])  Complications: No apparent anesthesia complications

## 2016-06-06 NOTE — Progress Notes (Addendum)
Pt is A&Ox3, NPO post mn except for sips of water this morning for oral meds. CHG bath completed. 1250 Report given to [facility] in pre-op.  1319 pt to preop via bed, family present.

## 2016-06-06 NOTE — Anesthesia Preprocedure Evaluation (Signed)
Anesthesia Evaluation  Patient identified by MRN, date of birth, ID band Patient confused    Reviewed: Allergy & Precautions, NPO status , Patient's Chart, lab work & pertinent test results  Airway Mallampati: II  TM Distance: >3 FB Neck ROM: Full    Dental no notable dental hx.    Pulmonary neg pulmonary ROS, Current Smoker,    Pulmonary exam normal breath sounds clear to auscultation       Cardiovascular hypertension, negative cardio ROS Normal cardiovascular exam Rhythm:Regular Rate:Normal     Neuro/Psych negative neurological ROS  negative psych ROS   GI/Hepatic negative GI ROS, Neg liver ROS,   Endo/Other  negative endocrine ROS  Renal/GU negative Renal ROS  negative genitourinary   Musculoskeletal negative musculoskeletal ROS (+)   Abdominal   Peds negative pediatric ROS (+)  Hematology negative hematology ROS (+)   Anesthesia Other Findings   Reproductive/Obstetrics negative OB ROS                             Anesthesia Physical Anesthesia Plan  ASA: II  Anesthesia Plan: General   Post-op Pain Management:    Induction: Intravenous  Airway Management Planned: Oral ETT  Additional Equipment:   Intra-op Plan:   Post-operative Plan: Extubation in OR  Informed Consent: I have reviewed the patients History and Physical, chart, labs and discussed the procedure including the risks, benefits and alternatives for the proposed anesthesia with the patient or authorized representative who has indicated his/her understanding and acceptance.   Dental advisory given  Plan Discussed with: CRNA  Anesthesia Plan Comments:         Anesthesia Quick Evaluation
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
## 2016-06-06 NOTE — Anesthesia Postprocedure Evaluation (Signed)
Anesthesia Post Note  Patient: Brittney Williams  Procedure(s) Performed: Procedure(s) (LRB): OPEN REDUCTION INTERNAL FIXATION (ORIF) BILATERAL ANKLE FRACTURES (Bilateral) REMOVAL EXTERNAL FIXATION RIGHT ANKLE (Right)  Patient location during evaluation: PACU Anesthesia Type: General Level of consciousness: sedated and patient cooperative Pain management: pain level controlled Vital Signs Assessment: post-procedure vital signs reviewed and stable Respiratory status: spontaneous breathing Cardiovascular status: stable Anesthetic complications: no    Last Vitals:  Vitals:   06/06/16 2010 06/06/16 2025  BP: (!) 171/71 (!) 164/76  Pulse: 92 89  Resp: 14 11  Temp:      Last Pain:  Vitals:   06/06/16 2010  TempSrc:   PainSc: [name]

## 2016-06-06 NOTE — Care Management Important Message (Signed)
Important Message  Patient Details  Name: Brittney Williams MRN: [id number] Date of Birth: May 26, 1946   Medicare Important Message Given:  Yes    Dolton Shaker 06/06/2016, 11:26 AM

## 2016-06-06 NOTE — Anesthesia Procedure Notes (Addendum)
Procedure Name: Intubation Date/Time: 06/06/2016 3:25 PM Performed by: Neldon Newport Pre-anesthesia Checklist: Timeout performed, Suction available, Patient being monitored, Emergency Drugs available and Patient identified Patient Re-evaluated:Patient Re-evaluated prior to inductionOxygen Delivery Method: Circle system utilized Preoxygenation: Pre-oxygenation with 100% oxygen Intubation Type: IV induction Ventilation: Mask ventilation without difficulty Laryngoscope Size: Mac and 3 Grade View: Grade III Tube type: Oral Tube size: 7.0 mm Number of attempts: 2 Airway Equipment and Method: Bougie stylet Placement Confirmation: ETT inserted through vocal cords under direct vision,  breath sounds checked- equal and bilateral and positive ETCO2 Secured at: 21 cm Tube secured with: Tape Dental Injury: Teeth and Oropharynx as per pre-operative assessment  Comments: Small mandible, sharop molar right side rear. Anterior anatomy

## 2016-06-06 NOTE — Anesthesia Postprocedure Evaluation (Signed)
Anesthesia Post Note  Patient: Brittney Williams  Procedure(s) Performed: Procedure(s) (LRB): OPEN REDUCTION INTERNAL FIXATION (ORIF) BILATERAL ANKLE FRACTURES (Bilateral) REMOVAL EXTERNAL FIXATION RIGHT ANKLE (Right)  Patient location during evaluation: PACU Anesthesia Type: General Level of consciousness: sedated and patient cooperative Pain management: pain level controlled Vital Signs Assessment: post-procedure vital signs reviewed and stable Respiratory status: spontaneous breathing Cardiovascular status: stable Anesthetic complications: no    Last Vitals:  Vitals:   06/06/16 2010 06/06/16 2025  BP: (!) 171/71 (!) 164/76  Pulse: 92 89  Resp: 14 11  Temp:      Last Pain:  Vitals:   06/06/16 2010  TempSrc:   PainSc: [place]

## 2016-06-06 NOTE — Progress Notes (Signed)
OT Cancellation Note  Patient Details Name: Brittney Williams MRN: [id number] DOB: 08/07/1946   Cancelled Treatment:    Reason Eval/Treat Not Completed: Other (comment). Orders received; pt preparing to go to OR this afternoon. Will check back on pt tomorrow as approrpiate  [name] 06/06/2016, 11:52 AM
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
## 2016-06-07 MED ORDER — FAMOTIDINE 20 MG PO TABS
20.0000 mg | ORAL_TABLET | Freq: Two times a day (BID) | ORAL | Status: DC
  Administered 2016-06-07 – 2016-06-08 (×3): 20 mg via ORAL
  Filled 2016-06-07 (×3): qty 1
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
## 2016-06-07 NOTE — Progress Notes (Signed)
OT Cancellation Note  Patient Details Name: Bertice Risse MRN: [id number] DOB: [birth year]   Cancelled Treatment:    Reason Eval/Treat Not Completed: Other (comment) OT screened, all OT needs can be met at next venue of care. Defer to SNF for further OT services.  [address] OTR/L [phone] 06/07/2016, 2:13 PM
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
## 2016-06-07 NOTE — Progress Notes (Signed)
   Subjective:  Patient reports pain as mild.  Sleeping comfortably  Objective:   VITALS:   Vitals:   06/06/16 2010 06/06/16 2025 06/06/16 2122 06/07/16 0205  BP: (!) 171/71 (!) 164/76 (!) 145/51 (!) 158/63  Pulse: 92 89 90 79  Resp: 14 11 15 15   Temp:   98.7 F (37.1 C) 98.1 F (36.7 C)  TempSrc:   Oral   SpO2: 100% 100% 97% 100%  Weight:      Height:        Intact pulses distally Incision: dressing C/D/I and no drainage No cellulitis present Compartment soft   Lab Results  Component Value Date   WBC 6.4 06/06/2016   HGB 9.6 (L) 06/06/2016   HCT 29.0 (L) 06/06/2016   MCV 86.8 06/06/2016   PLT 246 06/06/2016     Assessment/Plan:  1 Day Post-Op   - Up with PT/OT - DVT ppx - SCDs, ambulation, lovenox - NWB bilateral operative extremity - Pain control - follow up in office in 1 week  Eduard Roux 06/07/2016, 7:35 AM [phone]

## 2016-06-08 ENCOUNTER — Encounter (HOSPITAL_COMMUNITY): Payer: Self-pay | Admitting: Orthopaedic Surgery

## 2016-06-08 ENCOUNTER — Telehealth (INDEPENDENT_AMBULATORY_CARE_PROVIDER_SITE_OTHER): Payer: Self-pay | Admitting: Radiology

## 2016-06-08 ENCOUNTER — Inpatient Hospital Stay
Admission: RE | Admit: 2016-06-08 | Discharge: 2016-07-18 | Disposition: A | Discharge status: Other Acute Inpatient Hospital | Payer: No Typology Code available for payment source | Source: Ambulatory Visit | Attending: Internal Medicine | Admitting: Internal Medicine

## 2016-06-08 DIAGNOSIS — S82842D Displaced bimalleolar fracture of left lower leg, subsequent encounter for closed fracture with routine healing: Secondary | ICD-10-CM | POA: Diagnosis not present

## 2016-06-08 DIAGNOSIS — R001 Bradycardia, unspecified: Secondary | ICD-10-CM | POA: Diagnosis not present

## 2016-06-08 DIAGNOSIS — S2241XS Multiple fractures of ribs, right side, sequela: Secondary | ICD-10-CM | POA: Diagnosis not present

## 2016-06-08 DIAGNOSIS — M1990 Unspecified osteoarthritis, unspecified site: Secondary | ICD-10-CM | POA: Diagnosis not present

## 2016-06-08 DIAGNOSIS — S82851D Displaced trimalleolar fracture of right lower leg, subsequent encounter for closed fracture with routine healing: Secondary | ICD-10-CM | POA: Diagnosis not present

## 2016-06-08 DIAGNOSIS — Z7901 Long term (current) use of anticoagulants: Secondary | ICD-10-CM | POA: Diagnosis not present

## 2016-06-08 DIAGNOSIS — S82891E Other fracture of right lower leg, subsequent encounter for open fracture type I or II with routine healing: Secondary | ICD-10-CM | POA: Diagnosis not present

## 2016-06-08 DIAGNOSIS — F418 Other specified anxiety disorders: Secondary | ICD-10-CM | POA: Diagnosis not present

## 2016-06-08 DIAGNOSIS — S92909A Unspecified fracture of unspecified foot, initial encounter for closed fracture: Secondary | ICD-10-CM | POA: Diagnosis not present

## 2016-06-08 DIAGNOSIS — S82891D Other fracture of right lower leg, subsequent encounter for closed fracture with routine healing: Secondary | ICD-10-CM | POA: Diagnosis not present

## 2016-06-08 DIAGNOSIS — S82892D Other fracture of left lower leg, subsequent encounter for closed fracture with routine healing: Secondary | ICD-10-CM | POA: Diagnosis not present

## 2016-06-08 DIAGNOSIS — T797XXS Traumatic subcutaneous emphysema, sequela: Secondary | ICD-10-CM | POA: Diagnosis not present

## 2016-06-08 DIAGNOSIS — S2241XA Multiple fractures of ribs, right side, initial encounter for closed fracture: Secondary | ICD-10-CM | POA: Diagnosis not present

## 2016-06-08 DIAGNOSIS — R079 Chest pain, unspecified: Secondary | ICD-10-CM | POA: Diagnosis not present

## 2016-06-08 DIAGNOSIS — S82892E Other fracture of left lower leg, subsequent encounter for open fracture type I or II with routine healing: Secondary | ICD-10-CM | POA: Diagnosis not present

## 2016-06-08 DIAGNOSIS — K219 Gastro-esophageal reflux disease without esophagitis: Secondary | ICD-10-CM | POA: Diagnosis not present

## 2016-06-08 DIAGNOSIS — S82892S Other fracture of left lower leg, sequela: Secondary | ICD-10-CM | POA: Diagnosis not present

## 2016-06-08 DIAGNOSIS — M6281 Muscle weakness (generalized): Secondary | ICD-10-CM | POA: Diagnosis not present

## 2016-06-08 DIAGNOSIS — X58XXXA Exposure to other specified factors, initial encounter: Secondary | ICD-10-CM | POA: Diagnosis not present

## 2016-06-08 DIAGNOSIS — R279 Unspecified lack of coordination: Secondary | ICD-10-CM | POA: Diagnosis not present

## 2016-06-08 DIAGNOSIS — I10 Essential (primary) hypertension: Secondary | ICD-10-CM | POA: Diagnosis not present

## 2016-06-08 DIAGNOSIS — S91002D Unspecified open wound, left ankle, subsequent encounter: Secondary | ICD-10-CM | POA: Diagnosis not present

## 2016-06-08 DIAGNOSIS — F419 Anxiety disorder, unspecified: Secondary | ICD-10-CM | POA: Diagnosis not present

## 2016-06-08 DIAGNOSIS — R350 Frequency of micturition: Secondary | ICD-10-CM | POA: Diagnosis not present

## 2016-06-08 DIAGNOSIS — T797XXA Traumatic subcutaneous emphysema, initial encounter: Secondary | ICD-10-CM | POA: Diagnosis not present

## 2016-06-08 DIAGNOSIS — E785 Hyperlipidemia, unspecified: Secondary | ICD-10-CM | POA: Diagnosis not present

## 2016-06-08 DIAGNOSIS — S91002A Unspecified open wound, left ankle, initial encounter: Secondary | ICD-10-CM | POA: Diagnosis not present

## 2016-06-08 DIAGNOSIS — S2241XD Multiple fractures of ribs, right side, subsequent encounter for fracture with routine healing: Secondary | ICD-10-CM | POA: Diagnosis not present

## 2016-06-08 DIAGNOSIS — F411 Generalized anxiety disorder: Secondary | ICD-10-CM | POA: Diagnosis not present

## 2016-06-08 DIAGNOSIS — R488 Other symbolic dysfunctions: Secondary | ICD-10-CM | POA: Diagnosis not present

## 2016-06-08 DIAGNOSIS — R410 Disorientation, unspecified: Secondary | ICD-10-CM | POA: Diagnosis not present

## 2016-06-08 DIAGNOSIS — J439 Emphysema, unspecified: Secondary | ICD-10-CM | POA: Diagnosis present

## 2016-06-08 DIAGNOSIS — S82892B Other fracture of left lower leg, initial encounter for open fracture type I or II: Secondary | ICD-10-CM | POA: Diagnosis not present

## 2016-06-08 DIAGNOSIS — S82891B Other fracture of right lower leg, initial encounter for open fracture type I or II: Secondary | ICD-10-CM | POA: Diagnosis not present

## 2016-06-08 DIAGNOSIS — N390 Urinary tract infection, site not specified: Secondary | ICD-10-CM | POA: Diagnosis not present

## 2016-06-08 DIAGNOSIS — S2243XA Multiple fractures of ribs, bilateral, initial encounter for closed fracture: Secondary | ICD-10-CM | POA: Diagnosis not present

## 2016-06-08 DIAGNOSIS — Y838 Other surgical procedures as the cause of abnormal reaction of the patient, or of later complication, without mention of misadventure at the time of the procedure: Secondary | ICD-10-CM | POA: Diagnosis not present

## 2016-06-08 DIAGNOSIS — I2510 Atherosclerotic heart disease of native coronary artery without angina pectoris: Secondary | ICD-10-CM | POA: Diagnosis not present

## 2016-06-08 DIAGNOSIS — F17210 Nicotine dependence, cigarettes, uncomplicated: Secondary | ICD-10-CM | POA: Diagnosis not present

## 2016-06-08 DIAGNOSIS — D62 Acute posthemorrhagic anemia: Secondary | ICD-10-CM | POA: Diagnosis not present

## 2016-06-08 DIAGNOSIS — T797XXD Traumatic subcutaneous emphysema, subsequent encounter: Secondary | ICD-10-CM | POA: Diagnosis not present

## 2016-06-08 DIAGNOSIS — J939 Pneumothorax, unspecified: Secondary | ICD-10-CM | POA: Diagnosis not present

## 2016-06-08 DIAGNOSIS — R262 Difficulty in walking, not elsewhere classified: Secondary | ICD-10-CM | POA: Diagnosis not present

## 2016-06-08 DIAGNOSIS — S82891S Other fracture of right lower leg, sequela: Secondary | ICD-10-CM | POA: Diagnosis not present

## 2016-06-08 DIAGNOSIS — Z4789 Encounter for other orthopedic aftercare: Secondary | ICD-10-CM | POA: Diagnosis not present

## 2016-06-08 DIAGNOSIS — F329 Major depressive disorder, single episode, unspecified: Secondary | ICD-10-CM | POA: Diagnosis not present

## 2016-06-08 DIAGNOSIS — Z79899 Other long term (current) drug therapy: Secondary | ICD-10-CM | POA: Diagnosis not present

## 2016-06-08 DIAGNOSIS — T8131XA Disruption of external operation (surgical) wound, not elsewhere classified, initial encounter: Secondary | ICD-10-CM | POA: Diagnosis not present

## 2016-06-08 DIAGNOSIS — R293 Abnormal posture: Secondary | ICD-10-CM | POA: Diagnosis not present

## 2016-06-08 MED ORDER — ALPRAZOLAM 0.5 MG PO TABS: 0.5000 mg | ORAL_TABLET | Freq: Two times a day (BID) | ORAL | 0 refills | Status: DC | PRN

## 2016-06-08 MED ORDER — ALPRAZOLAM 0.5 MG PO TABS
0.5000 mg | ORAL_TABLET | Freq: Two times a day (BID) | ORAL | Status: DC | PRN
  Administered 2016-06-08: 0.5 mg via ORAL
  Filled 2016-06-08: qty 1

## 2016-06-08 MED ORDER — ENOXAPARIN SODIUM 40 MG/0.4ML ~~LOC~~ SOLN: 40.0000 mg | SUBCUTANEOUS | Status: DC

## 2016-06-08 MED ORDER — DOCUSATE SODIUM 100 MG PO CAPS
100.0000 mg | ORAL_CAPSULE | Freq: Two times a day (BID) | ORAL | Status: DC
  Administered 2016-06-08: 100 mg via ORAL
  Filled 2016-06-08: qty 1

## 2016-06-08 MED ORDER — OXYCODONE HCL 5 MG PO TABS: 5.0000 mg | ORAL_TABLET | ORAL | 0 refills | Status: DC | PRN

## 2016-06-08 MED ORDER — POLYETHYLENE GLYCOL 3350 17 G PO PACK
17.0000 g | PACK | Freq: Every day | ORAL | Status: DC
  Administered 2016-06-08: 17 g via ORAL
  Filled 2016-06-08: qty 1

## 2016-06-08 NOTE — Telephone Encounter (Signed)
Anderson Malta, patients daughter calling concerned about the fact that they are moving patient to [facility] in [place], [place].  Anderson Malta doesn't feel that the patient is ready to be out of the hospital. States that they have only done to physical therapy 2 times, and her BP was low.  Anderson Malta doesn't feel patient is stable enough to be transported.

## 2016-06-08 NOTE — Clinical Social Work Placement (Signed)
   CLINICAL SOCIAL WORK PLACEMENT  NOTE  Date:  06/08/2016  Patient Details  Name: Brittney Williams MRN: [id number] Date of Birth: Dec 10, 1945  Clinical Social Work is seeking post-discharge placement for this patient at the [place] level of care (*CSW will initial, date and re-position this form in  chart as items are completed):  Yes   Patient/family provided with [facility] Work Department's list of facilities offering this level of care within the geographic area requested by the patient (or if unable, by the patient's family).  Yes   Patient/family informed of their freedom to choose among providers that offer the needed level of care, that participate in Medicare, Medicaid or managed care program needed by the patient, have an available bed and are willing to accept the patient.  Yes   Patient/family informed of [facility]'s ownership interest in [facility] and [facility], as well as of the fact that they are under no obligation to receive care at these facilities.  PASRR submitted to EDS on       PASRR number received on       Existing PASRR number confirmed on 06/07/16     FL2 transmitted to all facilities in geographic area requested by pt/family on 06/07/16     FL2 transmitted to all facilities within larger geographic area on       Patient informed that his/her managed care company has contracts with or will negotiate with certain facilities, including the following:        Yes   Patient/family informed of bed offers received.  Patient chooses bed at [facility]     Physician recommends and patient chooses bed at      Patient to be transferred to [facility] on 06/08/16.  Patient to be transferred to facility by [facility]     Patient family notified on 06/08/16 of transfer.  Name of family member notified:  Pt's daughter, Anderson Malta     PHYSICIAN       Additional Comment:     _______________________________________________ Darden Dates, LCSW 06/08/2016, 5:12 PM

## 2016-06-08 NOTE — Progress Notes (Signed)
Notified CSW of pt being medical ready for discharge to SNF today.    Reinaldo Raddle, RN, BSN  Trauma/Neuro ICU Case Manager [phone]
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
## 2016-06-08 NOTE — Progress Notes (Signed)
   Subjective:  Patient reports pain as mild.  Sleeping comfortably  Objective:   VITALS:   Vitals:   06/07/16 1022 06/07/16 2145 06/08/16 0200 06/08/16 0612  BP: (!) 127/48 (!) 123/42 (!) 114/44 (!) 131/51  Pulse: (!) 107 78 81 73  Resp: 19 20 19 18   Temp: 97.8 F (36.6 C) 98.9 F (37.2 C) 98.6 F (37 C) 98 F (36.7 C)  TempSrc: Oral Oral    SpO2: (!) 77% 95% 94% 95%  Weight:      Height:        Intact pulses distally Incision: dressing C/D/I and no drainage No cellulitis present Compartment soft   Lab Results  Component Value Date   WBC 6.4 06/06/2016   HGB 9.6 (L) 06/06/2016   HCT 29.0 (L) 06/06/2016   MCV 86.8 06/06/2016   PLT 246 06/06/2016     Assessment/Plan:  2 Days Post-Op   - Up with PT/OT - DVT ppx - SCDs, ambulation, lovenox - NWB bilateral operative extremity - Pain control - follow up in office in 1 week - SNF pending  Eduard Roux 06/08/2016, 9:56 AM [phone]

## 2016-06-08 NOTE — Progress Notes (Signed)
Report called to [facility], given to RN at [phone].  IV removed. Belongings packed. Current BP 122/49.  Understanding verbalized.  Transportation with [employer].
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
## 2016-06-08 NOTE — Care Management Note (Signed)
Case Management Note  Patient Details  Name: Brittney Williams MRN: [id number] Date of Birth: May 06, 1946  Subjective/Objective:  Pt medically stable for discharge to SNF, per MD.                  Action/Plan: Plan dc to SNF today, per CSW arrangements.    Expected Discharge Date:    06/08/16              Expected Discharge Plan:  Skilled Nursing Facility  In-House Referral:  Clinical Social Work  Discharge planning Services  CM Consult  Post Acute Care Choice:    Choice offered to:     DME Arranged:    DME Agency:     HH Arranged:    [place] Agency:     Status of Service:  Completed, signed off  If discussed at [employer], dates discussed:    Additional Comments:  Reinaldo Raddle, RN, BSN  Trauma/Neuro ICU Case Manager [phone]
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
## 2016-06-11 ENCOUNTER — Non-Acute Institutional Stay (SKILLED_NURSING_FACILITY): Payer: Medicare Other | Admitting: Internal Medicine

## 2016-06-11 ENCOUNTER — Encounter (HOSPITAL_COMMUNITY): Payer: Self-pay | Admitting: Emergency Medicine

## 2016-06-11 ENCOUNTER — Encounter: Payer: Self-pay | Admitting: Internal Medicine

## 2016-06-11 ENCOUNTER — Encounter (HOSPITAL_COMMUNITY)
Admission: AD | Admit: 2016-06-11 | Discharge: 2016-06-11 | Disposition: A | Discharge status: Home / Self Care | Payer: No Typology Code available for payment source | Source: Skilled Nursing Facility | Attending: Physician Assistant | Admitting: Physician Assistant

## 2016-06-11 DIAGNOSIS — T797XXS Traumatic subcutaneous emphysema, sequela: Secondary | ICD-10-CM | POA: Diagnosis not present

## 2016-06-11 DIAGNOSIS — S82891S Other fracture of right lower leg, sequela: Secondary | ICD-10-CM | POA: Diagnosis not present

## 2016-06-11 DIAGNOSIS — S2241XS Multiple fractures of ribs, right side, sequela: Secondary | ICD-10-CM

## 2016-06-11 DIAGNOSIS — D62 Acute posthemorrhagic anemia: Secondary | ICD-10-CM | POA: Diagnosis not present

## 2016-06-11 DIAGNOSIS — E785 Hyperlipidemia, unspecified: Secondary | ICD-10-CM

## 2016-06-11 DIAGNOSIS — K219 Gastro-esophageal reflux disease without esophagitis: Secondary | ICD-10-CM | POA: Diagnosis not present

## 2016-06-11 DIAGNOSIS — N390 Urinary tract infection, site not specified: Secondary | ICD-10-CM | POA: Diagnosis not present

## 2016-06-11 DIAGNOSIS — F418 Other specified anxiety disorders: Secondary | ICD-10-CM | POA: Diagnosis not present

## 2016-06-11 DIAGNOSIS — I10 Essential (primary) hypertension: Secondary | ICD-10-CM | POA: Diagnosis not present

## 2016-06-11 DIAGNOSIS — S82892S Other fracture of left lower leg, sequela: Secondary | ICD-10-CM | POA: Diagnosis not present

## 2016-06-11 LAB — URINALYSIS, ROUTINE W REFLEX MICROSCOPIC
Bilirubin Urine: NEGATIVE
Glucose, UA: NEGATIVE mg/dL
KETONES UR: NEGATIVE mg/dL
Leukocytes, UA: NEGATIVE
NITRITE: NEGATIVE
Protein, ur: NEGATIVE mg/dL
Specific Gravity, Urine: <1.005 — ABNORMAL LOW (ref 1.005–1.030)
pH: 7 (ref 5.0–8.0)

## 2016-06-11 LAB — URINE MICROSCOPIC-ADD ON
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
## 2016-06-11 NOTE — Telephone Encounter (Signed)
Tried to call but no answer, if she calls back please advise. I will try again later.

## 2016-06-11 NOTE — Telephone Encounter (Signed)
She has already been discharged to SNF. Don't worry about it
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
## 2016-06-11 NOTE — Telephone Encounter (Signed)
Called again and no one answered Left message on machine to return the call

## 2016-06-13 ENCOUNTER — Non-Acute Institutional Stay (SKILLED_NURSING_FACILITY): Payer: Medicare Other | Admitting: Internal Medicine

## 2016-06-13 ENCOUNTER — Encounter: Payer: Self-pay | Admitting: Internal Medicine

## 2016-06-13 DIAGNOSIS — T797XXS Traumatic subcutaneous emphysema, sequela: Secondary | ICD-10-CM

## 2016-06-13 DIAGNOSIS — R410 Disorientation, unspecified: Secondary | ICD-10-CM | POA: Diagnosis not present

## 2016-06-13 DIAGNOSIS — S2241XS Multiple fractures of ribs, right side, sequela: Secondary | ICD-10-CM

## 2016-06-13 DIAGNOSIS — S82891S Other fracture of right lower leg, sequela: Secondary | ICD-10-CM | POA: Diagnosis not present

## 2016-06-13 DIAGNOSIS — S82892S Other fracture of left lower leg, sequela: Secondary | ICD-10-CM | POA: Diagnosis not present
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
## 2016-06-14 ENCOUNTER — Non-Acute Institutional Stay (SKILLED_NURSING_FACILITY): Payer: Medicare Other | Admitting: Internal Medicine

## 2016-06-14 ENCOUNTER — Encounter: Payer: Self-pay | Admitting: Internal Medicine

## 2016-06-14 ENCOUNTER — Ambulatory Visit (HOSPITAL_COMMUNITY)
Admission: RE | Admit: 2016-06-14 | Discharge: 2016-06-14 | Disposition: A | Discharge status: Home / Self Care | Payer: No Typology Code available for payment source | Source: Ambulatory Visit | Attending: Internal Medicine | Admitting: Internal Medicine

## 2016-06-14 ENCOUNTER — Other Ambulatory Visit: Payer: Self-pay | Admitting: Internal Medicine

## 2016-06-14 DIAGNOSIS — S2243XA Multiple fractures of ribs, bilateral, initial encounter for closed fracture: Secondary | ICD-10-CM | POA: Insufficient documentation

## 2016-06-14 DIAGNOSIS — X58XXXA Exposure to other specified factors, initial encounter: Secondary | ICD-10-CM | POA: Insufficient documentation

## 2016-06-14 DIAGNOSIS — T797XXA Traumatic subcutaneous emphysema, initial encounter: Secondary | ICD-10-CM | POA: Insufficient documentation

## 2016-06-14 DIAGNOSIS — T797XXS Traumatic subcutaneous emphysema, sequela: Secondary | ICD-10-CM | POA: Diagnosis not present

## 2016-06-14 DIAGNOSIS — R079 Chest pain, unspecified: Secondary | ICD-10-CM | POA: Diagnosis not present

## 2016-06-14 DIAGNOSIS — I10 Essential (primary) hypertension: Secondary | ICD-10-CM | POA: Diagnosis not present

## 2016-06-14 DIAGNOSIS — D62 Acute posthemorrhagic anemia: Secondary | ICD-10-CM | POA: Diagnosis not present

## 2016-06-14 DIAGNOSIS — T148XXA Other injury of unspecified body region, initial encounter: Secondary | ICD-10-CM

## 2016-06-14 DIAGNOSIS — R001 Bradycardia, unspecified: Secondary | ICD-10-CM

## 2016-06-14 LAB — URINE CULTURE: Culture: 20000 — AB
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
## 2016-06-15 ENCOUNTER — Encounter (HOSPITAL_COMMUNITY)
Admission: RE | Admit: 2016-06-15 | Discharge: 2016-06-15 | Disposition: A | Discharge status: Home / Self Care | Payer: Medicare Other | Source: Skilled Nursing Facility | Attending: Internal Medicine | Admitting: Internal Medicine

## 2016-06-15 DIAGNOSIS — Z4789 Encounter for other orthopedic aftercare: Secondary | ICD-10-CM | POA: Insufficient documentation

## 2016-06-15 DIAGNOSIS — R262 Difficulty in walking, not elsewhere classified: Secondary | ICD-10-CM | POA: Insufficient documentation

## 2016-06-15 DIAGNOSIS — R488 Other symbolic dysfunctions: Secondary | ICD-10-CM | POA: Insufficient documentation

## 2016-06-15 DIAGNOSIS — M6281 Muscle weakness (generalized): Secondary | ICD-10-CM | POA: Insufficient documentation

## 2016-06-15 DIAGNOSIS — I10 Essential (primary) hypertension: Secondary | ICD-10-CM | POA: Insufficient documentation

## 2016-06-15 DIAGNOSIS — F411 Generalized anxiety disorder: Secondary | ICD-10-CM | POA: Insufficient documentation

## 2016-06-15 LAB — BASIC METABOLIC PANEL
ANION GAP: 7 (ref 5–15)
BUN: 19 mg/dL (ref 6–20)
CO2: 27 mmol/L (ref 22–32)
Calcium: 8.9 mg/dL (ref 8.9–10.3)
Chloride: 104 mmol/L (ref 101–111)
Creatinine, Ser: 1.09 mg/dL — ABNORMAL HIGH (ref 0.44–1.00)
GFR calc Af Amer: 59 mL/min — ABNORMAL LOW (ref >60)
GFR, EST NON AFRICAN AMERICAN: 51 mL/min — AB (ref >60)
Glucose, Bld: 116 mg/dL — ABNORMAL HIGH (ref 65–99)
POTASSIUM: 3.9 mmol/L (ref 3.5–5.1)
Sodium: 138 mmol/L (ref 135–145)

## 2016-06-15 LAB — CBC WITH DIFFERENTIAL/PLATELET
BASOS ABS: 0 10*3/uL (ref 0.0–0.1)
Basophils Relative: 0 %
Eosinophils Absolute: 0.1 10*3/uL (ref 0.0–0.7)
Eosinophils Relative: 1 %
HEMATOCRIT: 30.1 % — AB (ref 36.0–46.0)
Hemoglobin: 9.7 g/dL — ABNORMAL LOW (ref 12.0–15.0)
LYMPHS PCT: 15 %
Lymphs Abs: 1.1 10*3/uL (ref 0.7–4.0)
MCH: 29.1 pg (ref 26.0–34.0)
MCHC: 32.2 g/dL (ref 30.0–36.0)
MCV: 90.4 fL (ref 78.0–100.0)
Monocytes Absolute: 0.3 10*3/uL (ref 0.1–1.0)
Monocytes Relative: 4 %
NEUTROS ABS: 5.9 10*3/uL (ref 1.7–7.7)
NEUTROS PCT: 80 %
Platelets: 446 10*3/uL — ABNORMAL HIGH (ref 150–400)
RBC: 3.33 MIL/uL — AB (ref 3.87–5.11)
RDW: 15.2 % (ref 11.5–15.5)
WBC: 7.5 10*3/uL (ref 4.0–10.5)

## 2016-06-18 ENCOUNTER — Encounter (INDEPENDENT_AMBULATORY_CARE_PROVIDER_SITE_OTHER): Payer: Self-pay | Admitting: Orthopaedic Surgery

## 2016-06-18 ENCOUNTER — Ambulatory Visit (INDEPENDENT_AMBULATORY_CARE_PROVIDER_SITE_OTHER): Payer: No Typology Code available for payment source

## 2016-06-18 ENCOUNTER — Ambulatory Visit (INDEPENDENT_AMBULATORY_CARE_PROVIDER_SITE_OTHER): Payer: No Typology Code available for payment source | Admitting: Orthopaedic Surgery

## 2016-06-18 ENCOUNTER — Encounter (HOSPITAL_COMMUNITY)
Admission: RE | Admit: 2016-06-18 | Discharge: 2016-06-18 | Disposition: A | Discharge status: Home / Self Care | Payer: Medicare Other | Source: Skilled Nursing Facility | Attending: Internal Medicine | Admitting: Internal Medicine

## 2016-06-18 DIAGNOSIS — S82892E Other fracture of left lower leg, subsequent encounter for open fracture type I or II with routine healing: Secondary | ICD-10-CM | POA: Diagnosis not present

## 2016-06-18 DIAGNOSIS — S82891E Other fracture of right lower leg, subsequent encounter for open fracture type I or II with routine healing: Secondary | ICD-10-CM | POA: Diagnosis not present

## 2016-06-18 LAB — BASIC METABOLIC PANEL
Anion gap: 6 (ref 5–15)
BUN: 19 mg/dL (ref 6–20)
CO2: 26 mmol/L (ref 22–32)
Calcium: 9 mg/dL (ref 8.9–10.3)
Chloride: 105 mmol/L (ref 101–111)
Creatinine, Ser: 1.05 mg/dL — ABNORMAL HIGH (ref 0.44–1.00)
GFR calc Af Amer: >60 mL/min (ref >60)
GFR calc non Af Amer: 53 mL/min — ABNORMAL LOW (ref >60)
Glucose, Bld: 112 mg/dL — ABNORMAL HIGH (ref 65–99)
Potassium: 4.2 mmol/L (ref 3.5–5.1)
Sodium: 137 mmol/L (ref 135–145)

## 2016-06-18 LAB — CBC WITH DIFFERENTIAL/PLATELET
BASOS PCT: 0 %
Basophils Absolute: 0 10*3/uL (ref 0.0–0.1)
EOS ABS: 0.1 10*3/uL (ref 0.0–0.7)
Eosinophils Relative: 2 %
HCT: 32.5 % — ABNORMAL LOW (ref 36.0–46.0)
Hemoglobin: 10.5 g/dL — ABNORMAL LOW (ref 12.0–15.0)
LYMPHS ABS: 1.4 10*3/uL (ref 0.7–4.0)
Lymphocytes Relative: 23 %
MCH: 29.4 pg (ref 26.0–34.0)
MCHC: 32.3 g/dL (ref 30.0–36.0)
MCV: 91 fL (ref 78.0–100.0)
MONO ABS: 0.5 10*3/uL (ref 0.1–1.0)
MONOS PCT: 8 %
NEUTROS PCT: 67 %
Neutro Abs: 4 10*3/uL (ref 1.7–7.7)
Platelets: 487 10*3/uL — ABNORMAL HIGH (ref 150–400)
RBC: 3.57 MIL/uL — ABNORMAL LOW (ref 3.87–5.11)
RDW: 15.3 % (ref 11.5–15.5)
WBC: 5.9 10*3/uL (ref 4.0–10.5)
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
## 2016-06-20 ENCOUNTER — Other Ambulatory Visit: Payer: Self-pay | Admitting: *Deleted

## 2016-06-20 MED ORDER — ALPRAZOLAM 0.5 MG PO TABS: 0.5000 mg | ORAL_TABLET | Freq: Two times a day (BID) | ORAL | 0 refills | Status: DC | PRN

## 2016-06-20 NOTE — Telephone Encounter (Signed)
[facility] #[phone] Fax: [phone]   

## 2016-06-21 ENCOUNTER — Other Ambulatory Visit (HOSPITAL_COMMUNITY): Payer: Self-pay | Admitting: Internal Medicine

## 2016-06-21 DIAGNOSIS — J439 Emphysema, unspecified: Secondary | ICD-10-CM

## 2016-06-26 ENCOUNTER — Ambulatory Visit (HOSPITAL_COMMUNITY)
Admission: RE | Admit: 2016-06-26 | Discharge: 2016-06-26 | Disposition: A | Discharge status: Home / Self Care | Payer: Medicare Other | Source: Ambulatory Visit | Attending: Internal Medicine | Admitting: Internal Medicine

## 2016-06-26 DIAGNOSIS — T797XXA Traumatic subcutaneous emphysema, initial encounter: Secondary | ICD-10-CM | POA: Diagnosis not present

## 2016-06-26 DIAGNOSIS — S2241XD Multiple fractures of ribs, right side, subsequent encounter for fracture with routine healing: Secondary | ICD-10-CM | POA: Diagnosis not present

## 2016-06-26 DIAGNOSIS — X58XXXA Exposure to other specified factors, initial encounter: Secondary | ICD-10-CM | POA: Insufficient documentation

## 2016-06-26 DIAGNOSIS — T797XXD Traumatic subcutaneous emphysema, subsequent encounter: Secondary | ICD-10-CM | POA: Diagnosis not present

## 2016-06-26 DIAGNOSIS — J439 Emphysema, unspecified: Secondary | ICD-10-CM

## 2016-06-26 DIAGNOSIS — S2241XA Multiple fractures of ribs, right side, initial encounter for closed fracture: Secondary | ICD-10-CM | POA: Diagnosis not present

## 2016-06-26 DIAGNOSIS — I2510 Atherosclerotic heart disease of native coronary artery without angina pectoris: Secondary | ICD-10-CM | POA: Insufficient documentation

## 2016-07-12 ENCOUNTER — Encounter: Payer: Self-pay | Admitting: Internal Medicine

## 2016-07-12 ENCOUNTER — Non-Acute Institutional Stay (SKILLED_NURSING_FACILITY): Payer: Medicare Other | Admitting: Internal Medicine

## 2016-07-12 DIAGNOSIS — K219 Gastro-esophageal reflux disease without esophagitis: Secondary | ICD-10-CM

## 2016-07-12 DIAGNOSIS — T797XXS Traumatic subcutaneous emphysema, sequela: Secondary | ICD-10-CM | POA: Diagnosis not present

## 2016-07-12 DIAGNOSIS — S82892E Other fracture of left lower leg, subsequent encounter for open fracture type I or II with routine healing: Secondary | ICD-10-CM | POA: Diagnosis not present

## 2016-07-12 DIAGNOSIS — I10 Essential (primary) hypertension: Secondary | ICD-10-CM | POA: Diagnosis not present

## 2016-07-12 DIAGNOSIS — S82891E Other fracture of right lower leg, subsequent encounter for open fracture type I or II with routine healing: Secondary | ICD-10-CM

## 2016-07-12 DIAGNOSIS — D62 Acute posthemorrhagic anemia: Secondary | ICD-10-CM

## 2016-07-12 DIAGNOSIS — S2241XS Multiple fractures of ribs, right side, sequela: Secondary | ICD-10-CM | POA: Diagnosis not present
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
## 2016-07-13 ENCOUNTER — Encounter (HOSPITAL_COMMUNITY)
Admission: RE | Admit: 2016-07-13 | Discharge: 2016-07-13 | Disposition: A | Discharge status: Home / Self Care | Payer: Medicare Other | Source: Skilled Nursing Facility | Attending: Internal Medicine | Admitting: Internal Medicine

## 2016-07-13 DIAGNOSIS — M6281 Muscle weakness (generalized): Secondary | ICD-10-CM | POA: Insufficient documentation

## 2016-07-13 DIAGNOSIS — R262 Difficulty in walking, not elsewhere classified: Secondary | ICD-10-CM | POA: Insufficient documentation

## 2016-07-13 DIAGNOSIS — F3289 Other specified depressive episodes: Secondary | ICD-10-CM | POA: Insufficient documentation

## 2016-07-13 DIAGNOSIS — Z4789 Encounter for other orthopedic aftercare: Secondary | ICD-10-CM | POA: Insufficient documentation

## 2016-07-13 DIAGNOSIS — I10 Essential (primary) hypertension: Secondary | ICD-10-CM | POA: Insufficient documentation

## 2016-07-13 DIAGNOSIS — K219 Gastro-esophageal reflux disease without esophagitis: Secondary | ICD-10-CM | POA: Insufficient documentation

## 2016-07-13 LAB — LIPID PANEL
CHOL/HDL RATIO: 3.2 ratio
Cholesterol: 142 mg/dL (ref 0–200)
HDL: 45 mg/dL (ref >40)
LDL CALC: 62 mg/dL (ref 0–99)
TRIGLYCERIDES: 175 mg/dL — AB (ref <150)
VLDL: 35 mg/dL (ref 0–40)

## 2016-07-13 LAB — CBC
HCT: 34.1 % — ABNORMAL LOW (ref 36.0–46.0)
Hemoglobin: 10.9 g/dL — ABNORMAL LOW (ref 12.0–15.0)
MCH: 29.3 pg (ref 26.0–34.0)
MCHC: 32 g/dL (ref 30.0–36.0)
MCV: 91.7 fL (ref 78.0–100.0)
PLATELETS: 265 10*3/uL (ref 150–400)
RBC: 3.72 MIL/uL — AB (ref 3.87–5.11)
RDW: 15.7 % — ABNORMAL HIGH (ref 11.5–15.5)
WBC: 4.8 10*3/uL (ref 4.0–10.5)

## 2016-07-13 LAB — COMPREHENSIVE METABOLIC PANEL
ALT: 22 U/L (ref 14–54)
ANION GAP: 8 (ref 5–15)
AST: 20 U/L (ref 15–41)
Albumin: 3.6 g/dL (ref 3.5–5.0)
Alkaline Phosphatase: 105 U/L (ref 38–126)
BUN: 20 mg/dL (ref 6–20)
CO2: 25 mmol/L (ref 22–32)
Calcium: 9.1 mg/dL (ref 8.9–10.3)
Chloride: 104 mmol/L (ref 101–111)
Creatinine, Ser: 1.07 mg/dL — ABNORMAL HIGH (ref 0.44–1.00)
GFR calc non Af Amer: 52 mL/min — ABNORMAL LOW (ref >60)
GFR, EST AFRICAN AMERICAN: 60 mL/min — AB (ref >60)
Glucose, Bld: 113 mg/dL — ABNORMAL HIGH (ref 65–99)
Potassium: 4.2 mmol/L (ref 3.5–5.1)
Sodium: 137 mmol/L (ref 135–145)
Total Bilirubin: 0.4 mg/dL (ref 0.3–1.2)
Total Protein: 6.4 g/dL — ABNORMAL LOW (ref 6.5–8.1)

## 2016-07-16 ENCOUNTER — Inpatient Hospital Stay (INDEPENDENT_AMBULATORY_CARE_PROVIDER_SITE_OTHER): Payer: No Typology Code available for payment source

## 2016-07-16 ENCOUNTER — Inpatient Hospital Stay (INDEPENDENT_AMBULATORY_CARE_PROVIDER_SITE_OTHER): Payer: Self-pay

## 2016-07-16 ENCOUNTER — Encounter (HOSPITAL_BASED_OUTPATIENT_CLINIC_OR_DEPARTMENT_OTHER): Payer: Self-pay | Admitting: *Deleted

## 2016-07-16 ENCOUNTER — Encounter (INDEPENDENT_AMBULATORY_CARE_PROVIDER_SITE_OTHER): Payer: Self-pay | Admitting: Orthopaedic Surgery

## 2016-07-16 ENCOUNTER — Other Ambulatory Visit (INDEPENDENT_AMBULATORY_CARE_PROVIDER_SITE_OTHER): Payer: Self-pay | Admitting: Orthopaedic Surgery

## 2016-07-16 ENCOUNTER — Ambulatory Visit (INDEPENDENT_AMBULATORY_CARE_PROVIDER_SITE_OTHER): Payer: Medicare Other | Admitting: Orthopaedic Surgery

## 2016-07-16 DIAGNOSIS — T8131XA Disruption of external operation (surgical) wound, not elsewhere classified, initial encounter: Secondary | ICD-10-CM | POA: Diagnosis not present

## 2016-07-16 DIAGNOSIS — S82892E Other fracture of left lower leg, subsequent encounter for open fracture type I or II with routine healing: Secondary | ICD-10-CM

## 2016-07-16 DIAGNOSIS — Z79899 Other long term (current) drug therapy: Secondary | ICD-10-CM | POA: Diagnosis not present

## 2016-07-16 DIAGNOSIS — S82891E Other fracture of right lower leg, subsequent encounter for open fracture type I or II with routine healing: Secondary | ICD-10-CM

## 2016-07-16 DIAGNOSIS — Z7901 Long term (current) use of anticoagulants: Secondary | ICD-10-CM | POA: Diagnosis not present

## 2016-07-16 DIAGNOSIS — Y838 Other surgical procedures as the cause of abnormal reaction of the patient, or of later complication, without mention of misadventure at the time of the procedure: Secondary | ICD-10-CM | POA: Insufficient documentation

## 2016-07-16 DIAGNOSIS — F17210 Nicotine dependence, cigarettes, uncomplicated: Secondary | ICD-10-CM | POA: Diagnosis not present

## 2016-07-16 DIAGNOSIS — F419 Anxiety disorder, unspecified: Secondary | ICD-10-CM | POA: Diagnosis not present

## 2016-07-16 DIAGNOSIS — F329 Major depressive disorder, single episode, unspecified: Secondary | ICD-10-CM | POA: Insufficient documentation

## 2016-07-16 DIAGNOSIS — I10 Essential (primary) hypertension: Secondary | ICD-10-CM | POA: Insufficient documentation

## 2016-07-16 DIAGNOSIS — K219 Gastro-esophageal reflux disease without esophagitis: Secondary | ICD-10-CM | POA: Insufficient documentation
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
## 2016-07-16 NOTE — Progress Notes (Signed)
Patient chart and EKG from 06-04-16 showing LBBB all reviewed by Dr Rodman Comp. OK for [facility]. Patient coming from [facility] in [place] via [employer].

## 2016-07-17 NOTE — Anesthesia Preprocedure Evaluation (Addendum)
Anesthesia Evaluation  Patient identified by MRN, date of birth, ID band Patient awake    Reviewed: Allergy & Precautions, NPO status , Patient's Chart, lab work & pertinent test results, reviewed documented beta blocker date and time   Airway Mallampati: II  TM Distance: >3 FB Neck ROM: Full    Dental  (+) Missing, Dental Advisory Given,    Pulmonary Current Smoker,    + rhonchi  + decreased breath sounds      Cardiovascular hypertension, Pt. on medications and Pt. on home beta blockers negative cardio ROS   Rhythm:Regular Rate:Normal     Neuro/Psych PSYCHIATRIC DISORDERS Anxiety Depression    GI/Hepatic Neg liver ROS, GERD  Medicated,  Endo/Other  negative endocrine ROS  Renal/GU negative Renal ROS  negative genitourinary   Musculoskeletal  (+) Arthritis , Osteoarthritis,    Abdominal   Peds negative pediatric ROS (+)  Hematology negative hematology ROS (+)   Anesthesia Other Findings   Reproductive/Obstetrics negative OB ROS                           Lab Results  Component Value Date   WBC 4.8 07/13/2016   HGB 10.9 (L) 07/13/2016   HCT 34.1 (L) 07/13/2016   MCV 91.7 07/13/2016   PLT 265 07/13/2016   Lab Results  Component Value Date   CREATININE 1.07 (H) 07/13/2016   BUN 20 07/13/2016   NA 137 07/13/2016   K 4.2 07/13/2016   CL 104 07/13/2016   CO2 25 07/13/2016   Lab Results  Component Value Date   INR 1.15 06/01/2016   EKG: normal sinus rhythm.   Anesthesia Physical Anesthesia Plan  ASA: II  Anesthesia Plan: General   Post-op Pain Management:    Induction: Intravenous  Airway Management Planned: LMA  Additional Equipment:   Intra-op Plan:   Post-operative Plan: Extubation in OR  Informed Consent: I have reviewed the patients History and Physical, chart, labs and discussed the procedure including the risks, benefits and alternatives for the proposed  anesthesia with the patient or authorized representative who has indicated his/her understanding and acceptance.   Dental advisory given  Plan Discussed with: CRNA  Anesthesia Plan Comments:         Anesthesia Quick Evaluation

## 2016-07-18 ENCOUNTER — Ambulatory Visit (HOSPITAL_BASED_OUTPATIENT_CLINIC_OR_DEPARTMENT_OTHER): Payer: No Typology Code available for payment source | Admitting: Anesthesiology

## 2016-07-18 ENCOUNTER — Encounter (HOSPITAL_BASED_OUTPATIENT_CLINIC_OR_DEPARTMENT_OTHER): Payer: Self-pay | Admitting: *Deleted

## 2016-07-18 ENCOUNTER — Encounter (HOSPITAL_BASED_OUTPATIENT_CLINIC_OR_DEPARTMENT_OTHER)
Admission: RE | Disposition: A | Discharge status: Skilled Nursing Facility | Payer: Self-pay | Source: Ambulatory Visit | Attending: Orthopaedic Surgery

## 2016-07-18 ENCOUNTER — Ambulatory Visit (HOSPITAL_BASED_OUTPATIENT_CLINIC_OR_DEPARTMENT_OTHER)
Admission: RE | Admit: 2016-07-18 | Discharge: 2016-07-18 | Disposition: A | Discharge status: Skilled Nursing Facility | Payer: No Typology Code available for payment source | Source: Ambulatory Visit | Attending: Orthopaedic Surgery | Admitting: Orthopaedic Surgery

## 2016-07-18 DIAGNOSIS — T8131XA Disruption of external operation (surgical) wound, not elsewhere classified, initial encounter: Secondary | ICD-10-CM | POA: Diagnosis not present

## 2016-07-18 DIAGNOSIS — S91002A Unspecified open wound, left ankle, initial encounter: Secondary | ICD-10-CM | POA: Diagnosis not present

## 2016-07-18 DIAGNOSIS — E785 Hyperlipidemia, unspecified: Secondary | ICD-10-CM | POA: Diagnosis not present

## 2016-07-18 DIAGNOSIS — F17210 Nicotine dependence, cigarettes, uncomplicated: Secondary | ICD-10-CM | POA: Diagnosis not present

## 2016-07-18 DIAGNOSIS — S91002D Unspecified open wound, left ankle, subsequent encounter: Secondary | ICD-10-CM | POA: Diagnosis not present

## 2016-07-18 DIAGNOSIS — S82892E Other fracture of left lower leg, subsequent encounter for open fracture type I or II with routine healing: Secondary | ICD-10-CM

## 2016-07-18 DIAGNOSIS — F329 Major depressive disorder, single episode, unspecified: Secondary | ICD-10-CM | POA: Diagnosis not present

## 2016-07-18 DIAGNOSIS — Z7901 Long term (current) use of anticoagulants: Secondary | ICD-10-CM | POA: Diagnosis not present

## 2016-07-18 DIAGNOSIS — K219 Gastro-esophageal reflux disease without esophagitis: Secondary | ICD-10-CM | POA: Diagnosis not present

## 2016-07-18 DIAGNOSIS — F419 Anxiety disorder, unspecified: Secondary | ICD-10-CM | POA: Diagnosis not present

## 2016-07-18 DIAGNOSIS — S82891E Other fracture of right lower leg, subsequent encounter for open fracture type I or II with routine healing: Secondary | ICD-10-CM

## 2016-07-18 DIAGNOSIS — M1990 Unspecified osteoarthritis, unspecified site: Secondary | ICD-10-CM | POA: Diagnosis not present

## 2016-07-18 DIAGNOSIS — I10 Essential (primary) hypertension: Secondary | ICD-10-CM | POA: Diagnosis not present

## 2016-07-18 DIAGNOSIS — Z79899 Other long term (current) drug therapy: Secondary | ICD-10-CM | POA: Diagnosis not present

## 2016-07-18 HISTORY — DX: Other fracture of left lower leg, initial encounter for closed fracture: S82.892A

## 2016-07-18 HISTORY — PX: I & D EXTREMITY: SHX5045

## 2016-07-18 HISTORY — DX: Multiple fractures of ribs, unspecified side, initial encounter for closed fracture: S22.49XA

## 2016-07-18 HISTORY — DX: Depression, unspecified: F32.A

## 2016-07-18 HISTORY — PX: HARDWARE REMOVAL: SHX979

## 2016-07-18 HISTORY — DX: Other fracture of right lower leg, initial encounter for closed fracture: S82.891A

## 2016-07-18 HISTORY — DX: Major depressive disorder, single episode, unspecified: F32.9

## 2016-07-18 SURGERY — IRRIGATION AND DEBRIDEMENT EXTREMITY
Anesthesia: General | Site: Ankle | Laterality: Left

## 2016-07-18 MED ORDER — MEPERIDINE HCL 25 MG/ML IJ SOLN: 6.2500 mg | INTRAMUSCULAR | Status: DC | PRN

## 2016-07-18 MED ORDER — HYDROMORPHONE HCL 1 MG/ML IJ SOLN
INTRAMUSCULAR | Status: AC
  Filled 2016-07-18: qty 1

## 2016-07-18 MED ORDER — HYDROMORPHONE HCL 1 MG/ML IJ SOLN
0.2500 mg | INTRAMUSCULAR | Status: DC | PRN
  Administered 2016-07-18 (×2): 0.25 mg via INTRAVENOUS

## 2016-07-18 MED ORDER — PROMETHAZINE HCL 25 MG/ML IJ SOLN: 6.2500 mg | INTRAMUSCULAR | Status: DC | PRN

## 2016-07-18 MED ORDER — LACTATED RINGERS IV SOLN: INTRAVENOUS | Status: DC

## 2016-07-18 MED ORDER — FENTANYL CITRATE (PF) 100 MCG/2ML IJ SOLN
INTRAMUSCULAR | Status: DC | PRN
  Administered 2016-07-18 (×2): 50 ug via INTRAVENOUS

## 2016-07-18 MED ORDER — PROPOFOL 10 MG/ML IV BOLUS
INTRAVENOUS | Status: DC | PRN
  Administered 2016-07-18: 200 mg via INTRAVENOUS

## 2016-07-18 MED ORDER — OXYCODONE HCL 5 MG PO TABS: 5.0000 mg | ORAL_TABLET | Freq: Once | ORAL | Status: DC | PRN

## 2016-07-18 MED ORDER — DEXAMETHASONE SODIUM PHOSPHATE 4 MG/ML IJ SOLN
INTRAMUSCULAR | Status: DC | PRN
  Administered 2016-07-18: 10 mg via INTRAVENOUS

## 2016-07-18 MED ORDER — KETOROLAC TROMETHAMINE 30 MG/ML IJ SOLN
INTRAMUSCULAR | Status: AC
  Filled 2016-07-18: qty 1

## 2016-07-18 MED ORDER — FENTANYL CITRATE (PF) 100 MCG/2ML IJ SOLN: 50.0000 ug | INTRAMUSCULAR | Status: DC | PRN

## 2016-07-18 MED ORDER — EPHEDRINE SULFATE 50 MG/ML IJ SOLN
INTRAMUSCULAR | Status: DC | PRN
  Administered 2016-07-18: 10 mg via INTRAVENOUS

## 2016-07-18 MED ORDER — SODIUM CHLORIDE 0.9 % IR SOLN
Status: DC | PRN
  Administered 2016-07-18: 3000 mL

## 2016-07-18 MED ORDER — OXYCODONE HCL 5 MG/5ML PO SOLN: 5.0000 mg | Freq: Once | ORAL | Status: DC | PRN

## 2016-07-18 MED ORDER — MIDAZOLAM HCL 2 MG/2ML IJ SOLN: 1.0000 mg | INTRAMUSCULAR | Status: DC | PRN

## 2016-07-18 MED ORDER — DEXAMETHASONE SODIUM PHOSPHATE 10 MG/ML IJ SOLN
INTRAMUSCULAR | Status: AC
  Filled 2016-07-18: qty 1

## 2016-07-18 MED ORDER — SCOPOLAMINE 1 MG/3DAYS TD PT72: 1.0000 | MEDICATED_PATCH | Freq: Once | TRANSDERMAL | Status: DC | PRN

## 2016-07-18 MED ORDER — ONDANSETRON HCL 4 MG/2ML IJ SOLN
INTRAMUSCULAR | Status: AC
  Filled 2016-07-18: qty 2

## 2016-07-18 MED ORDER — LIDOCAINE HCL (CARDIAC) 20 MG/ML IV SOLN
INTRAVENOUS | Status: DC | PRN
  Administered 2016-07-18: 30 mg via INTRAVENOUS

## 2016-07-18 MED ORDER — CEFAZOLIN SODIUM-DEXTROSE 2-4 GM/100ML-% IV SOLN
2.0000 g | INTRAVENOUS | Status: AC
  Administered 2016-07-18: 2 g via INTRAVENOUS

## 2016-07-18 MED ORDER — BUPIVACAINE HCL (PF) 0.5 % IJ SOLN
INTRAMUSCULAR | Status: DC | PRN
  Administered 2016-07-18: 5 mL

## 2016-07-18 MED ORDER — LACTATED RINGERS IV SOLN
INTRAVENOUS | Status: DC
  Administered 2016-07-18 (×2): via INTRAVENOUS

## 2016-07-18 MED ORDER — PROPOFOL 10 MG/ML IV BOLUS
INTRAVENOUS | Status: AC
  Filled 2016-07-18: qty 20

## 2016-07-18 MED ORDER — LIDOCAINE 2% (20 MG/ML) 5 ML SYRINGE
INTRAMUSCULAR | Status: AC
  Filled 2016-07-18: qty 5

## 2016-07-18 MED ORDER — ONDANSETRON HCL 4 MG/2ML IJ SOLN
INTRAMUSCULAR | Status: DC | PRN
  Administered 2016-07-18: 4 mg via INTRAVENOUS

## 2016-07-18 MED ORDER — MIDAZOLAM HCL 2 MG/2ML IJ SOLN
INTRAMUSCULAR | Status: AC
  Filled 2016-07-18: qty 2

## 2016-07-18 MED ORDER — EPHEDRINE 5 MG/ML INJ
INTRAVENOUS | Status: AC
  Filled 2016-07-18: qty 10

## 2016-07-18 MED ORDER — FENTANYL CITRATE (PF) 100 MCG/2ML IJ SOLN
INTRAMUSCULAR | Status: AC
  Filled 2016-07-18: qty 2

## 2016-07-18 MED ORDER — CEFAZOLIN SODIUM-DEXTROSE 2-4 GM/100ML-% IV SOLN
INTRAVENOUS | Status: AC
  Filled 2016-07-18: qty 100

## 2016-07-18 SURGICAL SUPPLY — 74 items
BANDAGE ACE 4X5 VEL STRL LF (GAUZE/BANDAGES/DRESSINGS) ×2 IMPLANT
BANDAGE ACE 6X5 VEL STRL LF (GAUZE/BANDAGES/DRESSINGS) IMPLANT
BANDAGE ESMARK 6X9 LF (GAUZE/BANDAGES/DRESSINGS) IMPLANT
BENZOIN TINCTURE PRP APPL 2/3 (GAUZE/BANDAGES/DRESSINGS) IMPLANT
BLADE HEX COATED 2.75 (ELECTRODE) IMPLANT
BLADE SURG 15 STRL LF DISP TIS (BLADE) ×2 IMPLANT
BLADE SURG 15 STRL SS (BLADE) ×2
BNDG COHESIVE 3X5 TAN STRL LF (GAUZE/BANDAGES/DRESSINGS) ×2 IMPLANT
BNDG ESMARK 4X9 LF (GAUZE/BANDAGES/DRESSINGS) ×2 IMPLANT
BNDG ESMARK 6X9 LF (GAUZE/BANDAGES/DRESSINGS)
BNDG GAUZE ELAST 4 BULKY (GAUZE/BANDAGES/DRESSINGS) ×2 IMPLANT
CANISTER SUCT 1200ML W/VALVE (MISCELLANEOUS) ×2 IMPLANT
COVER BACK TABLE 60X90IN (DRAPES) ×2 IMPLANT
CUFF TOURNIQUET SINGLE 24IN (TOURNIQUET CUFF) IMPLANT
CUFF TOURNIQUET SINGLE 34IN LL (TOURNIQUET CUFF) IMPLANT
DECANTER SPIKE VIAL GLASS SM (MISCELLANEOUS) ×2 IMPLANT
DRAPE EXTREMITY T 121X128X90 (DRAPE) ×2 IMPLANT
DRAPE IMP U-DRAPE 54X76 (DRAPES) ×2 IMPLANT
DRAPE OEC MINIVIEW 54X84 (DRAPES) IMPLANT
DRAPE U-SHAPE 47X51 STRL (DRAPES) IMPLANT
DURAPREP 26ML APPLICATOR (WOUND CARE) ×2 IMPLANT
ELECT REM PT RETURN 9FT ADLT (ELECTROSURGICAL)
ELECTRODE REM PT RTRN 9FT ADLT (ELECTROSURGICAL) IMPLANT
GAUZE SPONGE 4X4 12PLY STRL (GAUZE/BANDAGES/DRESSINGS) ×2 IMPLANT
GAUZE SPONGE 4X4 16PLY XRAY LF (GAUZE/BANDAGES/DRESSINGS) IMPLANT
GAUZE XEROFORM 1X8 LF (GAUZE/BANDAGES/DRESSINGS) ×2 IMPLANT
GLOVE BIOGEL PI IND STRL 7.0 (GLOVE) ×2 IMPLANT
GLOVE BIOGEL PI INDICATOR 7.0 (GLOVE) ×2
GLOVE ECLIPSE 6.5 STRL STRAW (GLOVE) ×2 IMPLANT
GLOVE SKINSENSE NS SZ7.5 (GLOVE) ×1
GLOVE SKINSENSE STRL SZ7.5 (GLOVE) ×1 IMPLANT
GLOVE SURG SYN 7.5  E (GLOVE) ×1
GLOVE SURG SYN 7.5 E (GLOVE) ×1 IMPLANT
GOWN STRL REIN XL XLG (GOWN DISPOSABLE) ×2 IMPLANT
GOWN STRL REUS W/ TWL LRG LVL3 (GOWN DISPOSABLE) ×1 IMPLANT
GOWN STRL REUS W/TWL LRG LVL3 (GOWN DISPOSABLE) ×1
IV NS IRRIG 3000ML ARTHROMATIC (IV SOLUTION) ×2 IMPLANT
MANIFOLD NEPTUNE II (INSTRUMENTS) ×2 IMPLANT
NEEDLE HYPO 22GX1.5 SAFETY (NEEDLE) ×2 IMPLANT
NS IRRIG 1000ML POUR BTL (IV SOLUTION) IMPLANT
PACK BASIN DAY SURGERY FS (CUSTOM PROCEDURE TRAY) ×2 IMPLANT
PAD CAST 3X4 CTTN HI CHSV (CAST SUPPLIES) IMPLANT
PAD CAST 4YDX4 CTTN HI CHSV (CAST SUPPLIES) IMPLANT
PADDING CAST COTTON 3X4 STRL (CAST SUPPLIES)
PADDING CAST COTTON 4X4 STRL (CAST SUPPLIES)
PADDING CAST COTTON 6X4 STRL (CAST SUPPLIES) IMPLANT
PADDING CAST SYN 6 (CAST SUPPLIES)
PADDING CAST SYNTHETIC 4 (CAST SUPPLIES)
PADDING CAST SYNTHETIC 4X4 STR (CAST SUPPLIES) IMPLANT
PADDING CAST SYNTHETIC 6X4 NS (CAST SUPPLIES) IMPLANT
PENCIL BUTTON HOLSTER BLD 10FT (ELECTRODE) IMPLANT
SET IRRIG Y TYPE TUR BLADDER L (SET/KITS/TRAYS/PACK) ×2 IMPLANT
SLEEVE SCD COMPRESS KNEE MED (MISCELLANEOUS) ×2 IMPLANT
SPLINT FAST PLASTER 5X30 (CAST SUPPLIES)
SPLINT FIBERGLASS 3X35 (CAST SUPPLIES) IMPLANT
SPLINT FIBERGLASS 4X30 (CAST SUPPLIES) IMPLANT
SPLINT PLASTER CAST FAST 5X30 (CAST SUPPLIES) IMPLANT
SPONGE LAP 18X18 X RAY DECT (DISPOSABLE) ×2 IMPLANT
STAPLER VISISTAT (STAPLE) IMPLANT
STRIP CLOSURE SKIN 1/2X4 (GAUZE/BANDAGES/DRESSINGS) IMPLANT
SUCTION FRAZIER HANDLE 10FR (MISCELLANEOUS)
SUCTION TUBE FRAZIER 10FR DISP (MISCELLANEOUS) IMPLANT
SUT ETHILON 3 0 PS 1 (SUTURE) ×2 IMPLANT
SUT MNCRL AB 4-0 PS2 18 (SUTURE) IMPLANT
SUT VIC AB 2-0 CT1 27 (SUTURE)
SUT VIC AB 2-0 CT1 TAPERPNT 27 (SUTURE) IMPLANT
SYR BULB 3OZ (MISCELLANEOUS) IMPLANT
SYR CONTROL 10ML LL (SYRINGE) ×2 IMPLANT
TOWEL OR 17X24 6PK STRL BLUE (TOWEL DISPOSABLE) ×4 IMPLANT
TOWEL OR NON WOVEN STRL DISP B (DISPOSABLE) ×2 IMPLANT
TRAY DSU PREP LF (CUSTOM PROCEDURE TRAY) ×2 IMPLANT
TUBE CONNECTING 20X1/4 (TUBING) ×2 IMPLANT
UNDERPAD 30X30 (UNDERPADS AND DIAPERS) ×2 IMPLANT
YANKAUER SUCT BULB TIP NO VENT (SUCTIONS) ×2 IMPLANT

## 2016-07-18 NOTE — Op Note (Addendum)
   Date of Surgery: 07/18/2016  INDICATIONS: Brittney Williams is a 70 y.o.-year-old female with a left medial ankle wound dehiscence;  The patient did consent to the procedure after discussion of the risks and benefits.  PREOPERATIVE DIAGNOSIS: Left ankle wound dehiscence with exposed hardware  POSTOPERATIVE DIAGNOSIS: Same.  PROCEDURE:  1. Irrigation and debridement of left medial ankle wound including skin and subcutaneous tissue 5 cm 2. Removal of deep screw from ankle. 3. Secondary closure of previous surgical dehiscence  SURGEON: N. Eduard Roux, M.D.  ASSIST: Nonegeneral.  ANESTHESIA:  general  IV FLUIDS AND URINE: See anesthesia.  ESTIMATED BLOOD LOSS: Minimal mL.  IMPLANTS: None  DRAINS: None  COMPLICATIONS: None.  DESCRIPTION OF PROCEDURE: The patient was brought to the operating room and placed supine on the operating table.  The patient had been signed prior to the procedure and this was documented. The patient had the anesthesia placed by the anesthesiologist.  A time-out was performed to confirm that this was the correct patient, site, side and location. The patient did receive antibiotics prior to the incision and was re-dosed during the procedure as needed at indicated intervals.  A tourniquet was not placed.  The patient had the operative extremity prepped and draped in the standard surgical fashion.    I first performed sharp excisional debridement of the skin and subcutaneous tissue of the wound dehiscence with a knife. I then identified the prominent screw and remove this with the appropriate screwdriver. I then thoroughly irrigated the wound with 3 L of normal saline. There was enough mobilization of the soft tissue in order to close primarily. The wound was closed with 2-0 Vicryl and 3-0 nylon. Sterile dressings were applied. Patient tolerated the procedure well and no immediate competitions.  POSTOPERATIVE PLAN: Patient will be weight-bear as tolerated to the left  lower extremity. She will follow up in the office in 2 weeks for wound check.  Brittney Cecil, MD [place] 9:22 AM

## 2016-07-18 NOTE — Anesthesia Postprocedure Evaluation (Signed)
Anesthesia Post Note  Patient: Brittney Williams  Procedure(s) Performed: Procedure(s) (LRB): IRRIGATION AND DEBRIDEMENT LEFT ANKLE, POSSIBLE HARDWARE REMOVAL (Left) HARDWARE REMOVAL (Left)  Patient location during evaluation: PACU Anesthesia Type: General Level of consciousness: awake and alert Pain management: pain level controlled Vital Signs Assessment: post-procedure vital signs reviewed and stable Respiratory status: spontaneous breathing, nonlabored ventilation, respiratory function stable and patient connected to nasal cannula oxygen Cardiovascular status: blood pressure returned to baseline and stable Postop Assessment: no signs of nausea or vomiting Anesthetic complications: no    Last Vitals:  Vitals:   07/18/16 1030 07/18/16 1115  BP: (!) 124/57 (!) 143/64  Pulse: 76 72  Resp: 12 18  Temp:  36.5 C    Last Pain:  Vitals:   07/18/16 1115  TempSrc:   PainSc: [place] Sebastiano Luecke

## 2016-07-18 NOTE — OR Nursing (Signed)
4.0 x 5mm cannulated screw removed from left ankle and disposed of.

## 2016-07-18 NOTE — Discharge Instructions (Signed)
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
## 2016-07-18 NOTE — Anesthesia Procedure Notes (Signed)
Procedure Name: LMA Insertion Date/Time: 07/18/2016 8:43 AM Performed by: Toula Moos L Pre-anesthesia Checklist: Patient identified, Emergency Drugs available, Suction available, Patient being monitored and Timeout performed Patient Re-evaluated:Patient Re-evaluated prior to inductionOxygen Delivery Method: Circle system utilized Preoxygenation: Pre-oxygenation with 100% oxygen Intubation Type: IV induction Ventilation: Mask ventilation without difficulty LMA: LMA inserted LMA Size: 4.0 Number of attempts: 1 Airway Equipment and Method: Bite block Placement Confirmation: positive ETCO2 Tube secured with: Tape Dental Injury: Teeth and Oropharynx as per pre-operative assessment

## 2016-07-18 NOTE — Transfer of Care (Signed)
Immediate Anesthesia Transfer of Care Note  Patient: Brittney Williams  Procedure(s) Performed: Procedure(s): IRRIGATION AND DEBRIDEMENT LEFT ANKLE, POSSIBLE HARDWARE REMOVAL (Left) HARDWARE REMOVAL (Left)  Patient Location: PACU  Anesthesia Type:General  Level of Consciousness: awake and oriented  Airway & Oxygen Therapy: Patient Spontanous Breathing and Patient connected to face mask oxygen  Post-op Assessment: Report given to RN and Post -op Vital signs reviewed and stable  Post vital signs: Reviewed and stable  Last Vitals:  Vitals:   07/18/16 0725 07/18/16 0927  BP: 126/82 (!) 152/73  Pulse: 60 82  Resp: 20 (!) 25  Temp: 36.4 C     Last Pain:  Vitals:   07/18/16 0725  TempSrc: Oral  PainSc: 0-No pain         Complications: No apparent anesthesia complications

## 2016-07-19 ENCOUNTER — Encounter (HOSPITAL_BASED_OUTPATIENT_CLINIC_OR_DEPARTMENT_OTHER): Payer: Self-pay | Admitting: Orthopaedic Surgery

## 2016-07-30 ENCOUNTER — Encounter (INDEPENDENT_AMBULATORY_CARE_PROVIDER_SITE_OTHER): Payer: Self-pay | Admitting: Orthopaedic Surgery

## 2016-07-30 ENCOUNTER — Ambulatory Visit (INDEPENDENT_AMBULATORY_CARE_PROVIDER_SITE_OTHER): Payer: Medicare Other | Admitting: Orthopaedic Surgery

## 2016-07-30 ENCOUNTER — Encounter (INDEPENDENT_AMBULATORY_CARE_PROVIDER_SITE_OTHER): Payer: Self-pay

## 2016-07-30 DIAGNOSIS — S82892E Other fracture of left lower leg, subsequent encounter for open fracture type I or II with routine healing: Secondary | ICD-10-CM

## 2016-07-30 DIAGNOSIS — S82891E Other fracture of right lower leg, subsequent encounter for open fracture type I or II with routine healing: Secondary | ICD-10-CM

## 2016-07-30 NOTE — Progress Notes (Signed)
Patient is 2 weeks status post hardware removal and washout of ankle wound and primary closure. She is doing well and living in a nursing facility. She has no signs of infection on exam she has no drainage. Sutures are intact. She does not have significant pain. Sutures were removed Bactroban ointment to the incision twice a day. Follow-up in 2 weeks for another wound check. Weight-bear as tolerated.

## 2016-08-01 ENCOUNTER — Telehealth (INDEPENDENT_AMBULATORY_CARE_PROVIDER_SITE_OTHER): Payer: Self-pay | Admitting: Orthopaedic Surgery

## 2016-08-02 NOTE — Telephone Encounter (Signed)
Daughter would like a letter for patient see message below. Thanks.
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
## 2016-08-02 NOTE — Telephone Encounter (Signed)
Faxed to [facility]

## 2016-08-14 ENCOUNTER — Ambulatory Visit (INDEPENDENT_AMBULATORY_CARE_PROVIDER_SITE_OTHER): Payer: Medicare Other | Admitting: Orthopaedic Surgery

## 2016-08-14 ENCOUNTER — Encounter (INDEPENDENT_AMBULATORY_CARE_PROVIDER_SITE_OTHER): Payer: Self-pay

## 2016-08-14 ENCOUNTER — Encounter (INDEPENDENT_AMBULATORY_CARE_PROVIDER_SITE_OTHER): Payer: Self-pay | Admitting: Orthopaedic Surgery

## 2016-08-14 DIAGNOSIS — S82891E Other fracture of right lower leg, subsequent encounter for open fracture type I or II with routine healing: Secondary | ICD-10-CM

## 2016-08-14 DIAGNOSIS — S82892E Other fracture of left lower leg, subsequent encounter for open fracture type I or II with routine healing: Secondary | ICD-10-CM

## 2016-08-14 NOTE — Progress Notes (Signed)
4 weeks s/p left ankle hardware removal.  Wound is improving.  She has a new wound on the right posterolateral ankle.  Scab fell off recently.  She does have moderate swelling of both ankle.  Continue silvadene to left ankle wound.  Wet to dry dressings to right ankle wound.  F/u 4 weeks for recheck of both wounds.

## 2016-08-17 ENCOUNTER — Encounter (HOSPITAL_COMMUNITY)
Admission: AD | Admit: 2016-08-17 | Discharge: 2016-08-17 | Disposition: A | Discharge status: Home / Self Care | Payer: Medicare Other | Source: Skilled Nursing Facility | Attending: Internal Medicine | Admitting: Internal Medicine

## 2016-08-17 ENCOUNTER — Encounter: Payer: Self-pay | Admitting: Internal Medicine

## 2016-08-17 ENCOUNTER — Non-Acute Institutional Stay (SKILLED_NURSING_FACILITY): Payer: Medicare Other | Admitting: Internal Medicine

## 2016-08-17 DIAGNOSIS — R350 Frequency of micturition: Secondary | ICD-10-CM | POA: Diagnosis not present

## 2016-08-17 DIAGNOSIS — D62 Acute posthemorrhagic anemia: Secondary | ICD-10-CM

## 2016-08-17 DIAGNOSIS — S82891D Other fracture of right lower leg, subsequent encounter for closed fracture with routine healing: Secondary | ICD-10-CM | POA: Diagnosis not present

## 2016-08-17 DIAGNOSIS — S82892D Other fracture of left lower leg, subsequent encounter for closed fracture with routine healing: Secondary | ICD-10-CM | POA: Diagnosis not present

## 2016-08-17 DIAGNOSIS — N390 Urinary tract infection, site not specified: Secondary | ICD-10-CM | POA: Insufficient documentation

## 2016-08-17 LAB — URINALYSIS, ROUTINE W REFLEX MICROSCOPIC
BILIRUBIN URINE: NEGATIVE
Glucose, UA: NEGATIVE mg/dL
Ketones, ur: NEGATIVE mg/dL
Nitrite: NEGATIVE
PH: 7 (ref 5.0–8.0)
Protein, ur: NEGATIVE mg/dL
SPECIFIC GRAVITY, URINE: 1.002 — AB (ref 1.005–1.030)
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
## 2016-08-20 ENCOUNTER — Encounter: Payer: Self-pay | Admitting: Internal Medicine

## 2016-08-20 ENCOUNTER — Encounter (HOSPITAL_COMMUNITY)
Admission: RE | Admit: 2016-08-20 | Discharge: 2016-08-20 | Disposition: A | Discharge status: Home / Self Care | Payer: Medicare Other | Source: Skilled Nursing Facility | Attending: *Deleted | Admitting: *Deleted

## 2016-08-20 ENCOUNTER — Non-Acute Institutional Stay (SKILLED_NURSING_FACILITY): Payer: Medicare Other | Admitting: Internal Medicine

## 2016-08-20 DIAGNOSIS — F418 Other specified anxiety disorders: Secondary | ICD-10-CM

## 2016-08-20 DIAGNOSIS — S82891D Other fracture of right lower leg, subsequent encounter for closed fracture with routine healing: Secondary | ICD-10-CM | POA: Diagnosis not present

## 2016-08-20 DIAGNOSIS — S82892D Other fracture of left lower leg, subsequent encounter for closed fracture with routine healing: Secondary | ICD-10-CM

## 2016-08-20 DIAGNOSIS — I10 Essential (primary) hypertension: Secondary | ICD-10-CM | POA: Diagnosis not present

## 2016-08-20 DIAGNOSIS — D62 Acute posthemorrhagic anemia: Secondary | ICD-10-CM

## 2016-08-20 LAB — BASIC METABOLIC PANEL
Anion gap: 7 (ref 5–15)
BUN: 18 mg/dL (ref 6–20)
CALCIUM: 8.8 mg/dL — AB (ref 8.9–10.3)
CHLORIDE: 106 mmol/L (ref 101–111)
CO2: 25 mmol/L (ref 22–32)
CREATININE: 0.96 mg/dL (ref 0.44–1.00)
GFR calc non Af Amer: 59 mL/min — ABNORMAL LOW (ref >60)
Glucose, Bld: 118 mg/dL — ABNORMAL HIGH (ref 65–99)
Potassium: 3.5 mmol/L (ref 3.5–5.1)
SODIUM: 138 mmol/L (ref 135–145)

## 2016-08-20 LAB — CBC WITH DIFFERENTIAL/PLATELET
BASOS PCT: 0 %
Basophils Absolute: 0 10*3/uL (ref 0.0–0.1)
EOS ABS: 0.1 10*3/uL (ref 0.0–0.7)
Eosinophils Relative: 2 %
HCT: 32.8 % — ABNORMAL LOW (ref 36.0–46.0)
Hemoglobin: 10.5 g/dL — ABNORMAL LOW (ref 12.0–15.0)
LYMPHS ABS: 1.5 10*3/uL (ref 0.7–4.0)
Lymphocytes Relative: 33 %
MCH: 29.4 pg (ref 26.0–34.0)
MCHC: 32 g/dL (ref 30.0–36.0)
MCV: 91.9 fL (ref 78.0–100.0)
MONO ABS: 0.3 10*3/uL (ref 0.1–1.0)
MONOS PCT: 7 %
Neutro Abs: 2.6 10*3/uL (ref 1.7–7.7)
Neutrophils Relative %: 58 %
Platelets: 264 10*3/uL (ref 150–400)
RBC: 3.57 MIL/uL — ABNORMAL LOW (ref 3.87–5.11)
RDW: 15.7 % — AB (ref 11.5–15.5)
WBC: 4.4 10*3/uL (ref 4.0–10.5)
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
## 2016-08-21 LAB — URINE CULTURE: Culture: >=100000 — AB

## 2016-08-24 DIAGNOSIS — I10 Essential (primary) hypertension: Secondary | ICD-10-CM | POA: Diagnosis not present

## 2016-08-24 DIAGNOSIS — F419 Anxiety disorder, unspecified: Secondary | ICD-10-CM | POA: Diagnosis not present

## 2016-08-24 DIAGNOSIS — Z1389 Encounter for screening for other disorder: Secondary | ICD-10-CM | POA: Diagnosis not present

## 2016-08-24 DIAGNOSIS — S82892F Other fracture of left lower leg, subsequent encounter for open fracture type IIIA, IIIB, or IIIC with routine healing: Secondary | ICD-10-CM | POA: Diagnosis not present

## 2016-08-24 DIAGNOSIS — J939 Pneumothorax, unspecified: Secondary | ICD-10-CM | POA: Diagnosis not present

## 2016-08-24 DIAGNOSIS — J982 Interstitial emphysema: Secondary | ICD-10-CM | POA: Diagnosis not present

## 2016-08-24 DIAGNOSIS — Z6831 Body mass index (BMI) 31.0-31.9, adult: Secondary | ICD-10-CM | POA: Diagnosis not present

## 2016-08-24 DIAGNOSIS — D62 Acute posthemorrhagic anemia: Secondary | ICD-10-CM | POA: Diagnosis not present

## 2016-08-24 DIAGNOSIS — S2241XK Multiple fractures of ribs, right side, subsequent encounter for fracture with nonunion: Secondary | ICD-10-CM | POA: Diagnosis not present

## 2016-08-24 DIAGNOSIS — E6609 Other obesity due to excess calories: Secondary | ICD-10-CM | POA: Diagnosis not present

## 2016-08-24 DIAGNOSIS — T1490XD Injury, unspecified, subsequent encounter: Secondary | ICD-10-CM | POA: Diagnosis not present

## 2016-08-24 DIAGNOSIS — E669 Obesity, unspecified: Secondary | ICD-10-CM | POA: Diagnosis not present

## 2016-09-03 DIAGNOSIS — S82842D Displaced bimalleolar fracture of left lower leg, subsequent encounter for closed fracture with routine healing: Secondary | ICD-10-CM | POA: Diagnosis not present

## 2016-09-03 DIAGNOSIS — M1990 Unspecified osteoarthritis, unspecified site: Secondary | ICD-10-CM | POA: Diagnosis not present

## 2016-09-03 DIAGNOSIS — S2241XD Multiple fractures of ribs, right side, subsequent encounter for fracture with routine healing: Secondary | ICD-10-CM | POA: Diagnosis not present

## 2016-09-03 DIAGNOSIS — F3289 Other specified depressive episodes: Secondary | ICD-10-CM | POA: Diagnosis not present

## 2016-09-03 DIAGNOSIS — S82841D Displaced bimalleolar fracture of right lower leg, subsequent encounter for closed fracture with routine healing: Secondary | ICD-10-CM | POA: Diagnosis not present

## 2016-09-03 DIAGNOSIS — D62 Acute posthemorrhagic anemia: Secondary | ICD-10-CM | POA: Diagnosis not present

## 2016-09-05 DIAGNOSIS — S2241XD Multiple fractures of ribs, right side, subsequent encounter for fracture with routine healing: Secondary | ICD-10-CM | POA: Diagnosis not present

## 2016-09-05 DIAGNOSIS — M1990 Unspecified osteoarthritis, unspecified site: Secondary | ICD-10-CM | POA: Diagnosis not present

## 2016-09-05 DIAGNOSIS — S82841D Displaced bimalleolar fracture of right lower leg, subsequent encounter for closed fracture with routine healing: Secondary | ICD-10-CM | POA: Diagnosis not present

## 2016-09-05 DIAGNOSIS — D62 Acute posthemorrhagic anemia: Secondary | ICD-10-CM | POA: Diagnosis not present

## 2016-09-05 DIAGNOSIS — F3289 Other specified depressive episodes: Secondary | ICD-10-CM | POA: Diagnosis not present

## 2016-09-05 DIAGNOSIS — S82842D Displaced bimalleolar fracture of left lower leg, subsequent encounter for closed fracture with routine healing: Secondary | ICD-10-CM | POA: Diagnosis not present

## 2016-09-07 DIAGNOSIS — S82841D Displaced bimalleolar fracture of right lower leg, subsequent encounter for closed fracture with routine healing: Secondary | ICD-10-CM | POA: Diagnosis not present

## 2016-09-07 DIAGNOSIS — M1990 Unspecified osteoarthritis, unspecified site: Secondary | ICD-10-CM | POA: Diagnosis not present

## 2016-09-07 DIAGNOSIS — F3289 Other specified depressive episodes: Secondary | ICD-10-CM | POA: Diagnosis not present

## 2016-09-07 DIAGNOSIS — S82842D Displaced bimalleolar fracture of left lower leg, subsequent encounter for closed fracture with routine healing: Secondary | ICD-10-CM | POA: Diagnosis not present

## 2016-09-07 DIAGNOSIS — S2241XD Multiple fractures of ribs, right side, subsequent encounter for fracture with routine healing: Secondary | ICD-10-CM | POA: Diagnosis not present

## 2016-09-07 DIAGNOSIS — D62 Acute posthemorrhagic anemia: Secondary | ICD-10-CM | POA: Diagnosis not present

## 2016-09-11 ENCOUNTER — Ambulatory Visit (INDEPENDENT_AMBULATORY_CARE_PROVIDER_SITE_OTHER): Payer: Medicare Other | Admitting: Orthopaedic Surgery

## 2016-09-11 ENCOUNTER — Encounter (INDEPENDENT_AMBULATORY_CARE_PROVIDER_SITE_OTHER): Payer: Self-pay | Admitting: Orthopaedic Surgery

## 2016-09-11 DIAGNOSIS — S82892E Other fracture of left lower leg, subsequent encounter for open fracture type I or II with routine healing: Secondary | ICD-10-CM

## 2016-09-11 DIAGNOSIS — S82891E Other fracture of right lower leg, subsequent encounter for open fracture type I or II with routine healing: Secondary | ICD-10-CM

## 2016-09-11 NOTE — Progress Notes (Signed)
Patient is following up today for wound check. Her left ankle wound has healed nicely. Her right ankle wound is stable with a dry scab. There is no signs of infection or drainage. At this point continue allowing the dry scab to heal through secondary intention. I educated him on the worrisome features should she have any she needs to come back to see me immediately. Otherwise I'll see her back as needed.

## 2016-09-13 DIAGNOSIS — S82842D Displaced bimalleolar fracture of left lower leg, subsequent encounter for closed fracture with routine healing: Secondary | ICD-10-CM | POA: Diagnosis not present

## 2016-09-13 DIAGNOSIS — S82841D Displaced bimalleolar fracture of right lower leg, subsequent encounter for closed fracture with routine healing: Secondary | ICD-10-CM | POA: Diagnosis not present

## 2016-09-13 DIAGNOSIS — M1990 Unspecified osteoarthritis, unspecified site: Secondary | ICD-10-CM | POA: Diagnosis not present

## 2016-09-13 DIAGNOSIS — S2241XD Multiple fractures of ribs, right side, subsequent encounter for fracture with routine healing: Secondary | ICD-10-CM | POA: Diagnosis not present

## 2016-09-13 DIAGNOSIS — D62 Acute posthemorrhagic anemia: Secondary | ICD-10-CM | POA: Diagnosis not present

## 2016-09-13 DIAGNOSIS — F3289 Other specified depressive episodes: Secondary | ICD-10-CM | POA: Diagnosis not present

## 2016-09-19 DIAGNOSIS — S2241XD Multiple fractures of ribs, right side, subsequent encounter for fracture with routine healing: Secondary | ICD-10-CM | POA: Diagnosis not present

## 2016-09-19 DIAGNOSIS — S82841D Displaced bimalleolar fracture of right lower leg, subsequent encounter for closed fracture with routine healing: Secondary | ICD-10-CM | POA: Diagnosis not present

## 2016-09-19 DIAGNOSIS — F3289 Other specified depressive episodes: Secondary | ICD-10-CM | POA: Diagnosis not present

## 2016-09-19 DIAGNOSIS — M1990 Unspecified osteoarthritis, unspecified site: Secondary | ICD-10-CM | POA: Diagnosis not present

## 2016-09-19 DIAGNOSIS — D62 Acute posthemorrhagic anemia: Secondary | ICD-10-CM | POA: Diagnosis not present

## 2016-09-19 DIAGNOSIS — S82842D Displaced bimalleolar fracture of left lower leg, subsequent encounter for closed fracture with routine healing: Secondary | ICD-10-CM | POA: Diagnosis not present

## 2016-09-26 DIAGNOSIS — M1990 Unspecified osteoarthritis, unspecified site: Secondary | ICD-10-CM | POA: Diagnosis not present

## 2016-09-26 DIAGNOSIS — S2241XD Multiple fractures of ribs, right side, subsequent encounter for fracture with routine healing: Secondary | ICD-10-CM | POA: Diagnosis not present

## 2016-09-26 DIAGNOSIS — S82841D Displaced bimalleolar fracture of right lower leg, subsequent encounter for closed fracture with routine healing: Secondary | ICD-10-CM | POA: Diagnosis not present

## 2016-09-26 DIAGNOSIS — S82842D Displaced bimalleolar fracture of left lower leg, subsequent encounter for closed fracture with routine healing: Secondary | ICD-10-CM | POA: Diagnosis not present

## 2016-09-26 DIAGNOSIS — F3289 Other specified depressive episodes: Secondary | ICD-10-CM | POA: Diagnosis not present

## 2016-09-26 DIAGNOSIS — D62 Acute posthemorrhagic anemia: Secondary | ICD-10-CM | POA: Diagnosis not present

## 2016-09-27 DIAGNOSIS — S2241XD Multiple fractures of ribs, right side, subsequent encounter for fracture with routine healing: Secondary | ICD-10-CM | POA: Diagnosis not present

## 2016-09-27 DIAGNOSIS — F3289 Other specified depressive episodes: Secondary | ICD-10-CM | POA: Diagnosis not present

## 2016-09-27 DIAGNOSIS — S82842D Displaced bimalleolar fracture of left lower leg, subsequent encounter for closed fracture with routine healing: Secondary | ICD-10-CM | POA: Diagnosis not present

## 2016-09-27 DIAGNOSIS — S82841D Displaced bimalleolar fracture of right lower leg, subsequent encounter for closed fracture with routine healing: Secondary | ICD-10-CM | POA: Diagnosis not present

## 2016-09-27 DIAGNOSIS — M1990 Unspecified osteoarthritis, unspecified site: Secondary | ICD-10-CM | POA: Diagnosis not present

## 2016-09-27 DIAGNOSIS — D62 Acute posthemorrhagic anemia: Secondary | ICD-10-CM | POA: Diagnosis not present

## 2016-10-01 DIAGNOSIS — F3289 Other specified depressive episodes: Secondary | ICD-10-CM | POA: Diagnosis not present

## 2016-10-01 DIAGNOSIS — M1990 Unspecified osteoarthritis, unspecified site: Secondary | ICD-10-CM | POA: Diagnosis not present

## 2016-10-01 DIAGNOSIS — S82841D Displaced bimalleolar fracture of right lower leg, subsequent encounter for closed fracture with routine healing: Secondary | ICD-10-CM | POA: Diagnosis not present

## 2016-10-01 DIAGNOSIS — S2241XD Multiple fractures of ribs, right side, subsequent encounter for fracture with routine healing: Secondary | ICD-10-CM | POA: Diagnosis not present

## 2016-10-01 DIAGNOSIS — D62 Acute posthemorrhagic anemia: Secondary | ICD-10-CM | POA: Diagnosis not present

## 2016-10-01 DIAGNOSIS — S82842D Displaced bimalleolar fracture of left lower leg, subsequent encounter for closed fracture with routine healing: Secondary | ICD-10-CM | POA: Diagnosis not present

## 2016-10-02 DIAGNOSIS — S82842D Displaced bimalleolar fracture of left lower leg, subsequent encounter for closed fracture with routine healing: Secondary | ICD-10-CM | POA: Diagnosis not present

## 2016-10-02 DIAGNOSIS — S2241XD Multiple fractures of ribs, right side, subsequent encounter for fracture with routine healing: Secondary | ICD-10-CM | POA: Diagnosis not present

## 2016-10-02 DIAGNOSIS — F3289 Other specified depressive episodes: Secondary | ICD-10-CM | POA: Diagnosis not present

## 2016-10-02 DIAGNOSIS — S82841D Displaced bimalleolar fracture of right lower leg, subsequent encounter for closed fracture with routine healing: Secondary | ICD-10-CM | POA: Diagnosis not present

## 2016-10-02 DIAGNOSIS — M1990 Unspecified osteoarthritis, unspecified site: Secondary | ICD-10-CM | POA: Diagnosis not present

## 2016-10-02 DIAGNOSIS — D62 Acute posthemorrhagic anemia: Secondary | ICD-10-CM | POA: Diagnosis not present

## 2016-10-03 DIAGNOSIS — M1990 Unspecified osteoarthritis, unspecified site: Secondary | ICD-10-CM | POA: Diagnosis not present

## 2016-10-03 DIAGNOSIS — S82841D Displaced bimalleolar fracture of right lower leg, subsequent encounter for closed fracture with routine healing: Secondary | ICD-10-CM | POA: Diagnosis not present

## 2016-10-03 DIAGNOSIS — F3289 Other specified depressive episodes: Secondary | ICD-10-CM | POA: Diagnosis not present

## 2016-10-03 DIAGNOSIS — D62 Acute posthemorrhagic anemia: Secondary | ICD-10-CM | POA: Diagnosis not present

## 2016-10-03 DIAGNOSIS — S82842D Displaced bimalleolar fracture of left lower leg, subsequent encounter for closed fracture with routine healing: Secondary | ICD-10-CM | POA: Diagnosis not present

## 2016-10-03 DIAGNOSIS — S2241XD Multiple fractures of ribs, right side, subsequent encounter for fracture with routine healing: Secondary | ICD-10-CM | POA: Diagnosis not present

## 2016-10-10 ENCOUNTER — Telehealth (INDEPENDENT_AMBULATORY_CARE_PROVIDER_SITE_OTHER): Payer: Self-pay | Admitting: Orthopaedic Surgery

## 2016-10-10 DIAGNOSIS — S82841D Displaced bimalleolar fracture of right lower leg, subsequent encounter for closed fracture with routine healing: Secondary | ICD-10-CM | POA: Diagnosis not present

## 2016-10-10 DIAGNOSIS — S82842D Displaced bimalleolar fracture of left lower leg, subsequent encounter for closed fracture with routine healing: Secondary | ICD-10-CM | POA: Diagnosis not present

## 2016-10-10 DIAGNOSIS — S2241XD Multiple fractures of ribs, right side, subsequent encounter for fracture with routine healing: Secondary | ICD-10-CM | POA: Diagnosis not present

## 2016-10-10 DIAGNOSIS — M1990 Unspecified osteoarthritis, unspecified site: Secondary | ICD-10-CM | POA: Diagnosis not present

## 2016-10-10 DIAGNOSIS — F3289 Other specified depressive episodes: Secondary | ICD-10-CM | POA: Diagnosis not present

## 2016-10-10 DIAGNOSIS — D62 Acute posthemorrhagic anemia: Secondary | ICD-10-CM | POA: Diagnosis not present

## 2016-10-10 NOTE — Telephone Encounter (Signed)
faxed

## 2016-10-10 NOTE — Telephone Encounter (Signed)
Called daughter and she is aware
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
## 2016-10-10 NOTE — Telephone Encounter (Signed)
yes

## 2016-10-10 NOTE — Telephone Encounter (Signed)
Please advise 

## 2016-10-10 NOTE — Telephone Encounter (Signed)
Rec'd called from patient's daughter Verdis Frederickson) She asked for a call when letter is faxed to the courthouse. The number to contact her is [phone]   Fax# to [facility] is [phone]

## 2016-10-11 DIAGNOSIS — M1990 Unspecified osteoarthritis, unspecified site: Secondary | ICD-10-CM | POA: Diagnosis not present

## 2016-10-11 DIAGNOSIS — S82842D Displaced bimalleolar fracture of left lower leg, subsequent encounter for closed fracture with routine healing: Secondary | ICD-10-CM | POA: Diagnosis not present

## 2016-10-11 DIAGNOSIS — S82841D Displaced bimalleolar fracture of right lower leg, subsequent encounter for closed fracture with routine healing: Secondary | ICD-10-CM | POA: Diagnosis not present

## 2016-10-11 DIAGNOSIS — F3289 Other specified depressive episodes: Secondary | ICD-10-CM | POA: Diagnosis not present

## 2016-10-11 DIAGNOSIS — S2241XD Multiple fractures of ribs, right side, subsequent encounter for fracture with routine healing: Secondary | ICD-10-CM | POA: Diagnosis not present

## 2016-10-11 DIAGNOSIS — D62 Acute posthemorrhagic anemia: Secondary | ICD-10-CM | POA: Diagnosis not present

## 2016-10-11 NOTE — Telephone Encounter (Signed)
Called pts daughter yesterday. Letter was faxed

## 2016-10-12 DIAGNOSIS — S82841D Displaced bimalleolar fracture of right lower leg, subsequent encounter for closed fracture with routine healing: Secondary | ICD-10-CM | POA: Diagnosis not present

## 2016-10-12 DIAGNOSIS — F3289 Other specified depressive episodes: Secondary | ICD-10-CM | POA: Diagnosis not present

## 2016-10-12 DIAGNOSIS — S82842D Displaced bimalleolar fracture of left lower leg, subsequent encounter for closed fracture with routine healing: Secondary | ICD-10-CM | POA: Diagnosis not present

## 2016-10-12 DIAGNOSIS — M1990 Unspecified osteoarthritis, unspecified site: Secondary | ICD-10-CM | POA: Diagnosis not present

## 2016-10-12 DIAGNOSIS — D62 Acute posthemorrhagic anemia: Secondary | ICD-10-CM | POA: Diagnosis not present

## 2016-10-12 DIAGNOSIS — S2241XD Multiple fractures of ribs, right side, subsequent encounter for fracture with routine healing: Secondary | ICD-10-CM | POA: Diagnosis not present

## 2016-12-18 DIAGNOSIS — F419 Anxiety disorder, unspecified: Secondary | ICD-10-CM | POA: Diagnosis not present

## 2016-12-18 DIAGNOSIS — E663 Overweight: Secondary | ICD-10-CM | POA: Diagnosis not present

## 2016-12-18 DIAGNOSIS — Z6828 Body mass index (BMI) 28.0-28.9, adult: Secondary | ICD-10-CM | POA: Diagnosis not present

## 2016-12-18 DIAGNOSIS — I10 Essential (primary) hypertension: Secondary | ICD-10-CM | POA: Diagnosis not present

## 2016-12-18 DIAGNOSIS — R1909 Other intra-abdominal and pelvic swelling, mass and lump: Secondary | ICD-10-CM | POA: Diagnosis not present

## 2016-12-18 DIAGNOSIS — N3281 Overactive bladder: Secondary | ICD-10-CM | POA: Diagnosis not present

## 2016-12-18 DIAGNOSIS — E782 Mixed hyperlipidemia: Secondary | ICD-10-CM | POA: Diagnosis not present

## 2016-12-18 DIAGNOSIS — Z1389 Encounter for screening for other disorder: Secondary | ICD-10-CM | POA: Diagnosis not present

## 2016-12-28 NOTE — Progress Notes (Signed)
This encounter was created in error - please disregard.

## 2017-01-29 DIAGNOSIS — K5730 Diverticulosis of large intestine without perforation or abscess without bleeding: Secondary | ICD-10-CM | POA: Diagnosis not present

## 2017-01-29 DIAGNOSIS — K55061 Focal (segmental) acute infarction of intestine, part unspecified: Secondary | ICD-10-CM | POA: Diagnosis not present

## 2017-01-29 DIAGNOSIS — K5790 Diverticulosis of intestine, part unspecified, without perforation or abscess without bleeding: Secondary | ICD-10-CM | POA: Diagnosis not present

## 2017-01-29 DIAGNOSIS — Z1231 Encounter for screening mammogram for malignant neoplasm of breast: Secondary | ICD-10-CM | POA: Diagnosis not present

## 2017-01-29 DIAGNOSIS — I700 Atherosclerosis of aorta: Secondary | ICD-10-CM | POA: Diagnosis not present

## 2017-01-29 DIAGNOSIS — R1909 Other intra-abdominal and pelvic swelling, mass and lump: Secondary | ICD-10-CM | POA: Diagnosis not present

## 2017-02-05 ENCOUNTER — Ambulatory Visit: Payer: Medicare Other | Admitting: General Surgery

## 2017-02-07 ENCOUNTER — Ambulatory Visit: Payer: Medicare Other | Admitting: General Surgery

## 2017-02-19 ENCOUNTER — Ambulatory Visit (INDEPENDENT_AMBULATORY_CARE_PROVIDER_SITE_OTHER): Payer: Medicare Other | Admitting: General Surgery

## 2017-02-19 ENCOUNTER — Encounter: Payer: Self-pay | Admitting: General Surgery

## 2017-02-19 VITALS — BP 121/60 | HR 62 | Temp 98.2°F | Resp 18 | Ht 63.0 in | Wt 161.0 lb

## 2017-02-19 DIAGNOSIS — K439 Ventral hernia without obstruction or gangrene: Secondary | ICD-10-CM | POA: Diagnosis not present
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
## 2017-02-19 NOTE — Patient Instructions (Signed)
 Ventral Hernia A ventral hernia is a bulge of tissue from inside the abdomen that pushes through a weak area of the muscles that form the front wall of the abdomen. The tissues inside the abdomen are inside a sac (peritoneum). These tissues include the small intestine, large intestine, and the fatty tissue that covers the intestines (omentum). Sometimes, the bulge that forms a hernia contains intestines. Other hernias contain only fat. Ventral hernias do not go away without surgical treatment. There are several types of ventral hernias. You may have:  A hernia at an incision site from previous abdominal surgery (incisional hernia).  A hernia just above the belly button (epigastric hernia), or at the belly button (umbilical hernia). These types of hernias can develop from heavy lifting or straining.  A hernia that comes and goes (reducible hernia). It may be visible only when you lift or strain. This type of hernia can be pushed back into the abdomen (reduced).  A hernia that traps abdominal tissue inside the hernia (incarcerated hernia). This type of hernia does not reduce.  A hernia that cuts off blood flow to the tissues inside the hernia (strangulated hernia). The tissues can start to die if this happens. This is a very painful bulge that cannot be reduced. A strangulated hernia is a medical emergency.  What are the causes? This condition is caused by abdominal tissue putting pressure on an area of weakness in the abdominal muscles. What increases the risk? The following factors may make you more likely to develop this condition:  Being female.  Being 60 or older.  Being overweight or obese.  Having had previous abdominal surgery, especially if there was an infection after surgery.  Having had an injury to the abdominal wall.  Having had several pregnancies.  Having a buildup of fluid inside the abdomen (ascites).  What are the signs or symptoms? The only symptom of a ventral  hernia may be a painless bulge in the abdomen. A reducible hernia may be visible only when you strain, cough, or lift. Other symptoms may include:  Dull pain.  A feeling of pressure.  Signs and symptoms of a strangulated hernia may include:  Increasing pain.  Nausea and vomiting.  Pain when pressing on the hernia.  The skin over the hernia turning red or purple.  Constipation.  Blood in the stool (feces).  How is this diagnosed? This condition may be diagnosed based on:  Your symptoms.  Your medical history.  A physical exam. You may be asked to cough or strain while standing. These actions increase the pressure inside your abdomen and force the hernia through the opening in your muscles. Your health care provider may try to reduce the hernia by pressing on it.  Imaging studies, such as an ultrasound or CT scan.  How is this treated? This condition is treated with surgery. If you have a strangulated hernia, surgery is done as soon as possible. If your hernia is small and not incarcerated, you may be asked to lose some weight before surgery. Follow these instructions at home:  Follow instructions from your health care provider about eating or drinking restrictions.  If you are overweight, your health care provider may recommend that you increase your activity level and eat a healthier diet.  Do not lift anything that is heavier than 10 lb (4.5 kg).  Return to your normal activities as told by your health care provider. Ask your health care provider what activities are safe for you. You   may need to avoid activities that increase pressure on your hernia.  Take over-the-counter and prescription medicines only as told by your health care provider.  Keep all follow-up visits as told by your health care provider. This is important. Contact a health care provider if:  Your hernia gets larger.  Your hernia becomes painful. Get help right away if:  Your hernia becomes  increasingly painful.  You have pain along with any of the following: ? Changes in skin color in the area of the hernia. ? Nausea. ? Vomiting. ? Fever. Summary  A ventral hernia is a bulge of tissue from inside the abdomen that pushes through a weak area of the muscles that form the front wall of the abdomen.  This condition is treated with surgery, which may be urgent depending on your hernia.  Do not lift anything that is heavier than 10 lb (4.5 kg), and follow activity instructions from your health care provider. This information is not intended to replace advice given to you by your health care provider. Make sure you discuss any questions you have with your health care provider. Document Released: 07/16/2012 Document Revised: 03/16/2016 Document Reviewed: 03/16/2016 Elsevier Interactive Patient Education  2018 [employer]  

## 2017-02-26 DIAGNOSIS — E785 Hyperlipidemia, unspecified: Secondary | ICD-10-CM | POA: Diagnosis not present

## 2017-02-26 DIAGNOSIS — G4733 Obstructive sleep apnea (adult) (pediatric): Secondary | ICD-10-CM | POA: Diagnosis not present

## 2017-02-26 DIAGNOSIS — I10 Essential (primary) hypertension: Secondary | ICD-10-CM | POA: Diagnosis not present

## 2017-02-26 DIAGNOSIS — E782 Mixed hyperlipidemia: Secondary | ICD-10-CM | POA: Diagnosis not present

## 2017-02-26 DIAGNOSIS — F419 Anxiety disorder, unspecified: Secondary | ICD-10-CM | POA: Diagnosis not present

## 2017-02-26 DIAGNOSIS — Z6830 Body mass index (BMI) 30.0-30.9, adult: Secondary | ICD-10-CM | POA: Diagnosis not present

## 2017-02-26 DIAGNOSIS — E6609 Other obesity due to excess calories: Secondary | ICD-10-CM | POA: Diagnosis not present

## 2017-02-26 DIAGNOSIS — Z0000 Encounter for general adult medical examination without abnormal findings: Secondary | ICD-10-CM | POA: Diagnosis not present

## 2017-02-26 DIAGNOSIS — Z1389 Encounter for screening for other disorder: Secondary | ICD-10-CM | POA: Diagnosis not present

## 2017-02-26 DIAGNOSIS — K219 Gastro-esophageal reflux disease without esophagitis: Secondary | ICD-10-CM | POA: Diagnosis not present

## 2017-03-20 DIAGNOSIS — E663 Overweight: Secondary | ICD-10-CM | POA: Diagnosis not present

## 2017-03-20 DIAGNOSIS — Z6829 Body mass index (BMI) 29.0-29.9, adult: Secondary | ICD-10-CM | POA: Diagnosis not present

## 2017-03-20 DIAGNOSIS — I10 Essential (primary) hypertension: Secondary | ICD-10-CM | POA: Diagnosis not present

## 2017-03-20 DIAGNOSIS — F419 Anxiety disorder, unspecified: Secondary | ICD-10-CM | POA: Diagnosis not present

## 2017-03-20 DIAGNOSIS — K219 Gastro-esophageal reflux disease without esophagitis: Secondary | ICD-10-CM | POA: Diagnosis not present

## 2017-04-17 DIAGNOSIS — E039 Hypothyroidism, unspecified: Secondary | ICD-10-CM | POA: Diagnosis not present
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
## 2017-06-24 DIAGNOSIS — Z1389 Encounter for screening for other disorder: Secondary | ICD-10-CM | POA: Diagnosis not present

## 2017-06-24 DIAGNOSIS — Z23 Encounter for immunization: Secondary | ICD-10-CM | POA: Diagnosis not present

## 2017-06-24 DIAGNOSIS — F419 Anxiety disorder, unspecified: Secondary | ICD-10-CM | POA: Diagnosis not present

## 2017-06-24 DIAGNOSIS — E663 Overweight: Secondary | ICD-10-CM | POA: Diagnosis not present

## 2017-06-24 DIAGNOSIS — Z6827 Body mass index (BMI) 27.0-27.9, adult: Secondary | ICD-10-CM | POA: Diagnosis not present

## 2017-06-24 DIAGNOSIS — I10 Essential (primary) hypertension: Secondary | ICD-10-CM | POA: Diagnosis not present

## 2017-09-30 DIAGNOSIS — B372 Candidiasis of skin and nail: Secondary | ICD-10-CM | POA: Diagnosis not present

## 2017-09-30 DIAGNOSIS — Z1389 Encounter for screening for other disorder: Secondary | ICD-10-CM | POA: Diagnosis not present

## 2018-01-08 DIAGNOSIS — Z1389 Encounter for screening for other disorder: Secondary | ICD-10-CM | POA: Diagnosis not present

## 2018-01-08 DIAGNOSIS — K219 Gastro-esophageal reflux disease without esophagitis: Secondary | ICD-10-CM | POA: Diagnosis not present

## 2018-01-08 DIAGNOSIS — I10 Essential (primary) hypertension: Secondary | ICD-10-CM | POA: Diagnosis not present

## 2018-02-12 DIAGNOSIS — R6889 Other general symptoms and signs: Secondary | ICD-10-CM | POA: Diagnosis not present

## 2018-04-09 DIAGNOSIS — Z1389 Encounter for screening for other disorder: Secondary | ICD-10-CM | POA: Diagnosis not present

## 2018-04-09 DIAGNOSIS — I10 Essential (primary) hypertension: Secondary | ICD-10-CM | POA: Diagnosis not present

## 2018-06-03 DIAGNOSIS — I10 Essential (primary) hypertension: Secondary | ICD-10-CM | POA: Diagnosis not present

## 2018-06-03 DIAGNOSIS — Z1389 Encounter for screening for other disorder: Secondary | ICD-10-CM | POA: Diagnosis not present

## 2018-06-03 DIAGNOSIS — E039 Hypothyroidism, unspecified: Secondary | ICD-10-CM | POA: Diagnosis not present

## 2018-06-03 DIAGNOSIS — Z6826 Body mass index (BMI) 26.0-26.9, adult: Secondary | ICD-10-CM | POA: Diagnosis not present

## 2018-06-03 DIAGNOSIS — Z0000 Encounter for general adult medical examination without abnormal findings: Secondary | ICD-10-CM | POA: Diagnosis not present

## 2018-06-03 DIAGNOSIS — K219 Gastro-esophageal reflux disease without esophagitis: Secondary | ICD-10-CM | POA: Diagnosis not present

## 2018-06-03 DIAGNOSIS — E663 Overweight: Secondary | ICD-10-CM | POA: Diagnosis not present

## 2018-06-03 DIAGNOSIS — Z23 Encounter for immunization: Secondary | ICD-10-CM | POA: Diagnosis not present

## 2018-06-03 DIAGNOSIS — E782 Mixed hyperlipidemia: Secondary | ICD-10-CM | POA: Diagnosis not present

## 2018-10-02 DIAGNOSIS — Z1389 Encounter for screening for other disorder: Secondary | ICD-10-CM | POA: Diagnosis not present

## 2018-10-02 DIAGNOSIS — K219 Gastro-esophageal reflux disease without esophagitis: Secondary | ICD-10-CM | POA: Diagnosis not present

## 2018-10-02 DIAGNOSIS — N3281 Overactive bladder: Secondary | ICD-10-CM | POA: Diagnosis not present

## 2018-10-02 DIAGNOSIS — I10 Essential (primary) hypertension: Secondary | ICD-10-CM | POA: Diagnosis not present

## 2018-10-02 DIAGNOSIS — J329 Chronic sinusitis, unspecified: Secondary | ICD-10-CM | POA: Diagnosis not present

## 2018-11-06 ENCOUNTER — Other Ambulatory Visit: Payer: Self-pay

## 2018-11-06 NOTE — Patient Outreach (Signed)
[facility]) Care Management  11/06/2018  LEANORE BIGGERS 1946/08/12 [id number]   Medication Adherence call to Mrs. Arita Severtson spoke with patient she is due on Atorvastatin 40 mg she explain she pick up this medication from the pharmacy yesterday for a 30 days supply . Mrs. Lantzy is showing past due under [place].    [place] Management Direct Dial [phone]  Fax [phone] [email or web address]

## 2018-12-01 DIAGNOSIS — I447 Left bundle-branch block, unspecified: Secondary | ICD-10-CM | POA: Diagnosis not present

## 2018-12-01 DIAGNOSIS — Z0001 Encounter for general adult medical examination with abnormal findings: Secondary | ICD-10-CM | POA: Diagnosis not present

## 2018-12-01 DIAGNOSIS — R4020 Unspecified coma: Secondary | ICD-10-CM | POA: Diagnosis not present

## 2018-12-01 DIAGNOSIS — I440 Atrioventricular block, first degree: Secondary | ICD-10-CM | POA: Diagnosis not present

## 2018-12-01 DIAGNOSIS — R0689 Other abnormalities of breathing: Secondary | ICD-10-CM | POA: Diagnosis not present

## 2018-12-01 DIAGNOSIS — I4430 Unspecified atrioventricular block: Secondary | ICD-10-CM | POA: Diagnosis not present

## 2018-12-01 DIAGNOSIS — I10 Essential (primary) hypertension: Secondary | ICD-10-CM | POA: Diagnosis not present

## 2018-12-01 DIAGNOSIS — K219 Gastro-esophageal reflux disease without esophagitis: Secondary | ICD-10-CM | POA: Diagnosis not present

## 2018-12-01 DIAGNOSIS — Z1389 Encounter for screening for other disorder: Secondary | ICD-10-CM | POA: Diagnosis not present

## 2018-12-02 DIAGNOSIS — K219 Gastro-esophageal reflux disease without esophagitis: Secondary | ICD-10-CM | POA: Diagnosis not present

## 2018-12-02 DIAGNOSIS — I10 Essential (primary) hypertension: Secondary | ICD-10-CM | POA: Diagnosis not present

## 2018-12-02 DIAGNOSIS — R55 Syncope and collapse: Secondary | ICD-10-CM | POA: Diagnosis not present

## 2018-12-02 DIAGNOSIS — T50905A Adverse effect of unspecified drugs, medicaments and biological substances, initial encounter: Secondary | ICD-10-CM | POA: Diagnosis not present

## 2018-12-02 DIAGNOSIS — R079 Chest pain, unspecified: Secondary | ICD-10-CM | POA: Diagnosis not present

## 2018-12-12 ENCOUNTER — Telehealth: Payer: Self-pay | Admitting: Cardiovascular Disease

## 2018-12-12 NOTE — Telephone Encounter (Signed)
Virtual Visit Pre-Appointment Phone Call  "(Name), I am calling you today to discuss your upcoming appointment. We are currently trying to limit exposure to the virus that causes COVID-19 by seeing patients at home rather than in the office."  1. "What is the BEST phone number to call the day of the visit?" - include this in appointment notes  2. Do you have or have access to (through a family member/friend) a smartphone with video capability that we can use for your visit?" a. If yes - list this number in appt notes as cell (if different from BEST phone #) and list the appointment type as a VIDEO visit in appointment notes b. If no - list the appointment type as a PHONE visit in appointment notes  3. Confirm consent - "In the setting of the current Covid19 crisis, you are scheduled for a (phone or video) visit with your provider on (date) at (time).  Just as we do with many in-office visits, in order for you to participate in this visit, we must obtain consent.  If you'd like, I can send this to your mychart (if signed up) or email for you to review.  Otherwise, I can obtain your verbal consent now.  All virtual visits are billed to your insurance company just like a normal visit would be.  By agreeing to a virtual visit, we'd like you to understand that the technology does not allow for your provider to perform an examination, and thus may limit your provider's ability to fully assess your condition. If your provider identifies any concerns that need to be evaluated in person, we will make arrangements to do so.  Finally, though the technology is pretty good, we cannot assure that it will always work on either your or our end, and in the setting of a video visit, we may have to convert it to a phone-only visit.  In either situation, we cannot ensure that we have a secure connection.  Are you willing to proceed?" STAFF: Did the patient verbally acknowledge consent to telehealth visit? Document  YES/NO here: Yes  4. Advise patient to be prepared - "Two hours prior to your appointment, go ahead and check your blood pressure, pulse, oxygen saturation, and your weight (if you have the equipment to check those) and write them all down. When your visit starts, your provider will ask you for this information. If you have an Apple Watch or Kardia device, please plan to have heart rate information ready on the day of your appointment. Please have a pen and paper handy nearby the day of the visit as well."  5. Give patient instructions for MyChart download to smartphone OR [email or web address] as below if video visit (depending on what platform provider is using)  6. Inform patient they will receive a phone call 15 minutes prior to their appointment time (may be from unknown caller ID) so they should be prepared to answer    TELEPHONE CALL NOTE  [name] has been deemed a candidate for a follow-up tele-health visit to limit community exposure during the Covid-19 pandemic. I spoke with the patient via phone to ensure availability of phone/video source, confirm preferred email & phone number, and discuss instructions and expectations.  I reminded Brittney Williams to be prepared with any vital sign and/or heart rhythm information that could potentially be obtained via home monitoring, at the time of her visit. I reminded Brittney Williams to expect a phone call prior to  her visit.  Terry L Goins 12/12/2018 9:22 AM
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
## 2018-12-17 ENCOUNTER — Telehealth (INDEPENDENT_AMBULATORY_CARE_PROVIDER_SITE_OTHER): Payer: Medicare Other | Admitting: Cardiovascular Disease

## 2018-12-17 ENCOUNTER — Encounter: Payer: Self-pay | Admitting: Cardiovascular Disease

## 2018-12-17 VITALS — BP 173/93 | HR 89 | Ht 63.5 in | Wt 152.2 lb

## 2018-12-17 DIAGNOSIS — R55 Syncope and collapse: Secondary | ICD-10-CM

## 2018-12-17 DIAGNOSIS — R079 Chest pain, unspecified: Secondary | ICD-10-CM

## 2018-12-17 NOTE — Addendum Note (Signed)
Addended by: Debbora Lacrosse R on: 12/17/2018 01:40 PM   Modules accepted: Orders

## 2018-12-17 NOTE — Progress Notes (Signed)
Medication Instructions:  Your physician recommends that you continue on your current medications as directed. Please refer to the Current Medication list given to you today.  Labwork:  None  Testing/Procedures: Your physician has requested that you have an echocardiogram. Echocardiography is a painless test that uses sound waves to create images of your heart. It provides your doctor with information about the size and shape of your heart and how well your heart's chambers and valves are working. This procedure takes approximately one hour. There are no restrictions for this procedure.  Your physician has requested that you have a lexiscan myoview. For further information please visit [email or web address]. Please follow instruction sheet, as given.    Follow-Up: Your physician recommends that you schedule a follow-up appointment in: 3 months    Any Other Special Instructions Will Be Listed Below (If Applicable).     If you need a refill on your cardiac medications before your next appointment, please call your pharmacy.

## 2019-01-23 ENCOUNTER — Encounter (HOSPITAL_COMMUNITY): Payer: Self-pay | Admitting: *Deleted

## 2019-01-23 ENCOUNTER — Emergency Department (HOSPITAL_COMMUNITY): Payer: Medicare Other

## 2019-01-23 ENCOUNTER — Inpatient Hospital Stay (HOSPITAL_COMMUNITY)
Admission: EM | Admit: 2019-01-23 | Discharge: 2019-01-26 | DRG: 640 | Disposition: A | Discharge status: Home / Self Care | Payer: Medicare Other | Attending: Family Medicine | Admitting: Family Medicine

## 2019-01-23 ENCOUNTER — Other Ambulatory Visit: Payer: Self-pay

## 2019-01-23 DIAGNOSIS — R4781 Slurred speech: Secondary | ICD-10-CM | POA: Diagnosis not present

## 2019-01-23 DIAGNOSIS — J439 Emphysema, unspecified: Secondary | ICD-10-CM | POA: Diagnosis present

## 2019-01-23 DIAGNOSIS — I10 Essential (primary) hypertension: Secondary | ICD-10-CM | POA: Diagnosis not present

## 2019-01-23 DIAGNOSIS — Z885 Allergy status to narcotic agent status: Secondary | ICD-10-CM | POA: Diagnosis not present

## 2019-01-23 DIAGNOSIS — R4182 Altered mental status, unspecified: Secondary | ICD-10-CM | POA: Diagnosis present

## 2019-01-23 DIAGNOSIS — R410 Disorientation, unspecified: Secondary | ICD-10-CM

## 2019-01-23 DIAGNOSIS — R55 Syncope and collapse: Secondary | ICD-10-CM | POA: Diagnosis not present

## 2019-01-23 DIAGNOSIS — Z87892 Personal history of anaphylaxis: Secondary | ICD-10-CM | POA: Diagnosis not present

## 2019-01-23 DIAGNOSIS — Z1159 Encounter for screening for other viral diseases: Secondary | ICD-10-CM | POA: Diagnosis not present

## 2019-01-23 DIAGNOSIS — K5730 Diverticulosis of large intestine without perforation or abscess without bleeding: Secondary | ICD-10-CM | POA: Diagnosis not present

## 2019-01-23 DIAGNOSIS — Z79899 Other long term (current) drug therapy: Secondary | ICD-10-CM

## 2019-01-23 DIAGNOSIS — F411 Generalized anxiety disorder: Secondary | ICD-10-CM | POA: Diagnosis present

## 2019-01-23 DIAGNOSIS — T424X5A Adverse effect of benzodiazepines, initial encounter: Secondary | ICD-10-CM | POA: Diagnosis present

## 2019-01-23 DIAGNOSIS — F329 Major depressive disorder, single episode, unspecified: Secondary | ICD-10-CM | POA: Diagnosis present

## 2019-01-23 DIAGNOSIS — I5022 Chronic systolic (congestive) heart failure: Secondary | ICD-10-CM | POA: Diagnosis not present

## 2019-01-23 DIAGNOSIS — Z91030 Bee allergy status: Secondary | ICD-10-CM

## 2019-01-23 DIAGNOSIS — N179 Acute kidney failure, unspecified: Secondary | ICD-10-CM

## 2019-01-23 DIAGNOSIS — I110 Hypertensive heart disease with heart failure: Secondary | ICD-10-CM | POA: Diagnosis present

## 2019-01-23 DIAGNOSIS — B961 Klebsiella pneumoniae [K. pneumoniae] as the cause of diseases classified elsewhere: Secondary | ICD-10-CM | POA: Diagnosis not present

## 2019-01-23 DIAGNOSIS — F17210 Nicotine dependence, cigarettes, uncomplicated: Secondary | ICD-10-CM | POA: Diagnosis present

## 2019-01-23 DIAGNOSIS — Z90711 Acquired absence of uterus with remaining cervical stump: Secondary | ICD-10-CM

## 2019-01-23 DIAGNOSIS — E860 Dehydration: Principal | ICD-10-CM | POA: Diagnosis present

## 2019-01-23 DIAGNOSIS — Z9181 History of falling: Secondary | ICD-10-CM | POA: Diagnosis not present

## 2019-01-23 DIAGNOSIS — N390 Urinary tract infection, site not specified: Secondary | ICD-10-CM | POA: Diagnosis present

## 2019-01-23 DIAGNOSIS — E785 Hyperlipidemia, unspecified: Secondary | ICD-10-CM | POA: Diagnosis not present

## 2019-01-23 DIAGNOSIS — I519 Heart disease, unspecified: Secondary | ICD-10-CM | POA: Diagnosis not present

## 2019-01-23 DIAGNOSIS — Z03818 Encounter for observation for suspected exposure to other biological agents ruled out: Secondary | ICD-10-CM | POA: Diagnosis not present

## 2019-01-23 DIAGNOSIS — K219 Gastro-esophageal reflux disease without esophagitis: Secondary | ICD-10-CM | POA: Diagnosis not present

## 2019-01-23 DIAGNOSIS — G92 Toxic encephalopathy: Secondary | ICD-10-CM | POA: Diagnosis present

## 2019-01-23 DIAGNOSIS — Y92009 Unspecified place in unspecified non-institutional (private) residence as the place of occurrence of the external cause: Secondary | ICD-10-CM

## 2019-01-23 DIAGNOSIS — Z9049 Acquired absence of other specified parts of digestive tract: Secondary | ICD-10-CM

## 2019-01-23 DIAGNOSIS — Z9851 Tubal ligation status: Secondary | ICD-10-CM

## 2019-01-23 LAB — COMPREHENSIVE METABOLIC PANEL
ALT: 21 U/L (ref 0–44)
AST: 19 U/L (ref 15–41)
Albumin: 3.6 g/dL (ref 3.5–5.0)
Alkaline Phosphatase: 61 U/L (ref 38–126)
Anion gap: 11 (ref 5–15)
BUN: 18 mg/dL (ref 8–23)
CO2: 24 mmol/L (ref 22–32)
Calcium: 9.2 mg/dL (ref 8.9–10.3)
Chloride: 105 mmol/L (ref 98–111)
Creatinine, Ser: 1.2 mg/dL — ABNORMAL HIGH (ref 0.44–1.00)
GFR calc Af Amer: 52 mL/min — ABNORMAL LOW (ref >60)
GFR calc non Af Amer: 45 mL/min — ABNORMAL LOW (ref >60)
Glucose, Bld: 104 mg/dL — ABNORMAL HIGH (ref 70–99)
Potassium: 3.8 mmol/L (ref 3.5–5.1)
Sodium: 140 mmol/L (ref 135–145)
Total Bilirubin: 1 mg/dL (ref 0.3–1.2)
Total Protein: 6 g/dL — ABNORMAL LOW (ref 6.5–8.1)

## 2019-01-23 LAB — ETHANOL: Alcohol, Ethyl (B): <10 mg/dL (ref <10)

## 2019-01-23 LAB — URINALYSIS, ROUTINE W REFLEX MICROSCOPIC
Bilirubin Urine: NEGATIVE
Glucose, UA: NEGATIVE mg/dL
Hgb urine dipstick: NEGATIVE
Ketones, ur: NEGATIVE mg/dL
Nitrite: POSITIVE — AB
Protein, ur: NEGATIVE mg/dL
Specific Gravity, Urine: 1.018 (ref 1.005–1.030)
WBC, UA: >50 WBC/hpf — ABNORMAL HIGH (ref 0–5)
pH: 5 (ref 5.0–8.0)

## 2019-01-23 LAB — TROPONIN I
Troponin I: <0.03 ng/mL (ref <0.03)
Troponin I: <0.03 ng/mL (ref <0.03)

## 2019-01-23 LAB — PROTIME-INR
INR: 1.1 (ref 0.8–1.2)
Prothrombin Time: 13.8 seconds (ref 11.4–15.2)

## 2019-01-23 LAB — CBC WITH DIFFERENTIAL/PLATELET
Abs Immature Granulocytes: 0.01 10*3/uL (ref 0.00–0.07)
Basophils Absolute: 0 10*3/uL (ref 0.0–0.1)
Basophils Relative: 0 %
Eosinophils Absolute: 0 10*3/uL (ref 0.0–0.5)
Eosinophils Relative: 1 %
HCT: 36.1 % (ref 36.0–46.0)
Hemoglobin: 11.7 g/dL — ABNORMAL LOW (ref 12.0–15.0)
Immature Granulocytes: 0 %
Lymphocytes Relative: 27 %
Lymphs Abs: 1.3 10*3/uL (ref 0.7–4.0)
MCH: 29.5 pg (ref 26.0–34.0)
MCHC: 32.4 g/dL (ref 30.0–36.0)
MCV: 91.2 fL (ref 80.0–100.0)
Monocytes Absolute: 0.4 10*3/uL (ref 0.1–1.0)
Monocytes Relative: 8 %
Neutro Abs: 2.9 10*3/uL (ref 1.7–7.7)
Neutrophils Relative %: 64 %
Platelets: 235 10*3/uL (ref 150–400)
RBC: 3.96 MIL/uL (ref 3.87–5.11)
RDW: 15 % (ref 11.5–15.5)
WBC: 4.6 10*3/uL (ref 4.0–10.5)
nRBC: 0 % (ref 0.0–0.2)

## 2019-01-23 LAB — RAPID URINE DRUG SCREEN, HOSP PERFORMED
Amphetamines: NOT DETECTED
Barbiturates: NOT DETECTED
Benzodiazepines: POSITIVE — AB
Cocaine: NOT DETECTED
Opiates: NOT DETECTED
Tetrahydrocannabinol: NOT DETECTED

## 2019-01-23 LAB — ACETAMINOPHEN LEVEL: Acetaminophen (Tylenol), Serum: <10 ug/mL — ABNORMAL LOW (ref 10–30)

## 2019-01-23 LAB — SARS CORONAVIRUS 2 BY RT PCR (HOSPITAL ORDER, PERFORMED IN ~~LOC~~ HOSPITAL LAB): SARS Coronavirus 2: NEGATIVE

## 2019-01-23 LAB — TSH: TSH: 1.057 u[IU]/mL (ref 0.350–4.500)

## 2019-01-23 LAB — LACTIC ACID, PLASMA
Lactic Acid, Venous: 0.8 mmol/L (ref 0.5–1.9)
Lactic Acid, Venous: 0.8 mmol/L (ref 0.5–1.9)

## 2019-01-23 LAB — SALICYLATE LEVEL: Salicylate Lvl: <7 mg/dL (ref 2.8–30.0)

## 2019-01-23 MED ORDER — ACETAMINOPHEN 650 MG RE SUPP: 650.0000 mg | Freq: Four times a day (QID) | RECTAL | Status: DC | PRN

## 2019-01-23 MED ORDER — POTASSIUM CHLORIDE IN NACL 20-0.9 MEQ/L-% IV SOLN
INTRAVENOUS | Status: DC
  Administered 2019-01-24: via INTRAVENOUS

## 2019-01-23 MED ORDER — ENOXAPARIN SODIUM 40 MG/0.4ML ~~LOC~~ SOLN
40.0000 mg | SUBCUTANEOUS | Status: DC
  Administered 2019-01-24 – 2019-01-25 (×3): 40 mg via SUBCUTANEOUS
  Filled 2019-01-23 (×3): qty 0.4

## 2019-01-23 MED ORDER — SODIUM CHLORIDE 0.9 % IV SOLN
INTRAVENOUS | Status: DC
  Administered 2019-01-23: 20:00:00 via INTRAVENOUS

## 2019-01-23 MED ORDER — POLYETHYLENE GLYCOL 3350 17 G PO PACK: 17.0000 g | PACK | Freq: Every day | ORAL | Status: DC | PRN

## 2019-01-23 MED ORDER — SODIUM CHLORIDE 0.9 % IV SOLN
1.0000 g | Freq: Once | INTRAVENOUS | Status: AC
  Administered 2019-01-23: 1 g via INTRAVENOUS
  Filled 2019-01-23: qty 10

## 2019-01-23 MED ORDER — ONDANSETRON HCL 4 MG/2ML IJ SOLN: 4.0000 mg | Freq: Four times a day (QID) | INTRAMUSCULAR | Status: DC | PRN

## 2019-01-23 MED ORDER — SODIUM CHLORIDE 0.9 % IV SOLN
1.0000 g | INTRAVENOUS | Status: DC
  Administered 2019-01-24 – 2019-01-25 (×2): 1 g via INTRAVENOUS
  Filled 2019-01-23 (×2): qty 10

## 2019-01-23 MED ORDER — ACETAMINOPHEN 325 MG PO TABS
650.0000 mg | ORAL_TABLET | Freq: Four times a day (QID) | ORAL | Status: DC | PRN
  Administered 2019-01-25 – 2019-01-26 (×3): 650 mg via ORAL
  Filled 2019-01-23 (×3): qty 2

## 2019-01-23 MED ORDER — CLONIDINE HCL 0.1 MG PO TABS
0.1000 mg | ORAL_TABLET | Freq: Two times a day (BID) | ORAL | Status: DC
  Administered 2019-01-24 (×2): 0.1 mg via ORAL
  Filled 2019-01-23: qty 1

## 2019-01-23 MED ORDER — ONDANSETRON HCL 4 MG PO TABS: 4.0000 mg | ORAL_TABLET | Freq: Four times a day (QID) | ORAL | Status: DC | PRN

## 2019-01-23 NOTE — Progress Notes (Signed)
Unable to complete admission questions at this time.  Patient is not answers questions appropriately.  When I asked her height she said no and when I asked the patient again,she stated "yes".  Will complete what I can
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
## 2019-01-23 NOTE — ED Notes (Signed)
Please contact pt's daughter : Verdis Frederickson as needed at [phone].
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
## 2019-01-23 NOTE — ED Triage Notes (Signed)
Confused for the past 4 days, states it is worse today per daughter

## 2019-01-23 NOTE — Plan of Care (Signed)
Patient still altered, but more oriented than when she arrived.  Will continue to monitor.

## 2019-01-24 ENCOUNTER — Observation Stay (HOSPITAL_BASED_OUTPATIENT_CLINIC_OR_DEPARTMENT_OTHER): Payer: Medicare Other

## 2019-01-24 ENCOUNTER — Observation Stay (HOSPITAL_COMMUNITY): Payer: Medicare Other

## 2019-01-24 DIAGNOSIS — Z9181 History of falling: Secondary | ICD-10-CM | POA: Diagnosis not present

## 2019-01-24 DIAGNOSIS — Z9049 Acquired absence of other specified parts of digestive tract: Secondary | ICD-10-CM | POA: Diagnosis not present

## 2019-01-24 DIAGNOSIS — I361 Nonrheumatic tricuspid (valve) insufficiency: Secondary | ICD-10-CM | POA: Diagnosis not present

## 2019-01-24 DIAGNOSIS — F17210 Nicotine dependence, cigarettes, uncomplicated: Secondary | ICD-10-CM | POA: Diagnosis present

## 2019-01-24 DIAGNOSIS — I340 Nonrheumatic mitral (valve) insufficiency: Secondary | ICD-10-CM

## 2019-01-24 DIAGNOSIS — Z91030 Bee allergy status: Secondary | ICD-10-CM | POA: Diagnosis not present

## 2019-01-24 DIAGNOSIS — B961 Klebsiella pneumoniae [K. pneumoniae] as the cause of diseases classified elsewhere: Secondary | ICD-10-CM | POA: Diagnosis present

## 2019-01-24 DIAGNOSIS — K5730 Diverticulosis of large intestine without perforation or abscess without bleeding: Secondary | ICD-10-CM | POA: Diagnosis present

## 2019-01-24 DIAGNOSIS — R410 Disorientation, unspecified: Secondary | ICD-10-CM | POA: Diagnosis not present

## 2019-01-24 DIAGNOSIS — Z87892 Personal history of anaphylaxis: Secondary | ICD-10-CM | POA: Diagnosis not present

## 2019-01-24 DIAGNOSIS — Z1159 Encounter for screening for other viral diseases: Secondary | ICD-10-CM | POA: Diagnosis not present

## 2019-01-24 DIAGNOSIS — R55 Syncope and collapse: Secondary | ICD-10-CM | POA: Diagnosis present

## 2019-01-24 DIAGNOSIS — J439 Emphysema, unspecified: Secondary | ICD-10-CM | POA: Diagnosis present

## 2019-01-24 DIAGNOSIS — K219 Gastro-esophageal reflux disease without esophagitis: Secondary | ICD-10-CM

## 2019-01-24 DIAGNOSIS — I6523 Occlusion and stenosis of bilateral carotid arteries: Secondary | ICD-10-CM | POA: Diagnosis not present

## 2019-01-24 DIAGNOSIS — E7849 Other hyperlipidemia: Secondary | ICD-10-CM | POA: Diagnosis not present

## 2019-01-24 DIAGNOSIS — E785 Hyperlipidemia, unspecified: Secondary | ICD-10-CM

## 2019-01-24 DIAGNOSIS — I10 Essential (primary) hypertension: Secondary | ICD-10-CM | POA: Diagnosis not present

## 2019-01-24 DIAGNOSIS — Z9851 Tubal ligation status: Secondary | ICD-10-CM | POA: Diagnosis not present

## 2019-01-24 DIAGNOSIS — Y92009 Unspecified place in unspecified non-institutional (private) residence as the place of occurrence of the external cause: Secondary | ICD-10-CM | POA: Diagnosis not present

## 2019-01-24 DIAGNOSIS — N390 Urinary tract infection, site not specified: Secondary | ICD-10-CM

## 2019-01-24 DIAGNOSIS — R4182 Altered mental status, unspecified: Secondary | ICD-10-CM | POA: Diagnosis not present

## 2019-01-24 DIAGNOSIS — I5022 Chronic systolic (congestive) heart failure: Secondary | ICD-10-CM | POA: Diagnosis present

## 2019-01-24 DIAGNOSIS — E860 Dehydration: Secondary | ICD-10-CM | POA: Diagnosis present

## 2019-01-24 DIAGNOSIS — R4781 Slurred speech: Secondary | ICD-10-CM | POA: Diagnosis present

## 2019-01-24 DIAGNOSIS — Z885 Allergy status to narcotic agent status: Secondary | ICD-10-CM | POA: Diagnosis not present

## 2019-01-24 DIAGNOSIS — F329 Major depressive disorder, single episode, unspecified: Secondary | ICD-10-CM | POA: Diagnosis present

## 2019-01-24 DIAGNOSIS — I6782 Cerebral ischemia: Secondary | ICD-10-CM | POA: Diagnosis not present

## 2019-01-24 DIAGNOSIS — F411 Generalized anxiety disorder: Secondary | ICD-10-CM | POA: Diagnosis present

## 2019-01-24 DIAGNOSIS — I110 Hypertensive heart disease with heart failure: Secondary | ICD-10-CM | POA: Diagnosis present

## 2019-01-24 DIAGNOSIS — Z79899 Other long term (current) drug therapy: Secondary | ICD-10-CM | POA: Diagnosis not present

## 2019-01-24 DIAGNOSIS — G92 Toxic encephalopathy: Secondary | ICD-10-CM | POA: Diagnosis present

## 2019-01-24 DIAGNOSIS — T424X5A Adverse effect of benzodiazepines, initial encounter: Secondary | ICD-10-CM | POA: Diagnosis present

## 2019-01-24 DIAGNOSIS — I519 Heart disease, unspecified: Secondary | ICD-10-CM | POA: Diagnosis not present

## 2019-01-24 LAB — ECHOCARDIOGRAM COMPLETE
Height: 64 in
Weight: 2395.08 oz

## 2019-01-24 LAB — TROPONIN I: Troponin I: <0.03 ng/mL (ref <0.03)

## 2019-01-24 MED ORDER — LEVOTHYROXINE SODIUM 25 MCG PO TABS
25.0000 ug | ORAL_TABLET | Freq: Every day | ORAL | Status: DC
  Administered 2019-01-25 – 2019-01-26 (×2): 25 ug via ORAL
  Filled 2019-01-24 (×2): qty 1

## 2019-01-24 MED ORDER — ALPRAZOLAM 0.25 MG PO TABS
0.2500 mg | ORAL_TABLET | Freq: Three times a day (TID) | ORAL | Status: DC | PRN
  Administered 2019-01-24 – 2019-01-26 (×2): 0.25 mg via ORAL
  Filled 2019-01-24 (×2): qty 1

## 2019-01-24 MED ORDER — FLUOXETINE HCL 20 MG PO CAPS
40.0000 mg | ORAL_CAPSULE | Freq: Every day | ORAL | Status: DC
  Administered 2019-01-24 – 2019-01-26 (×3): 40 mg via ORAL
  Filled 2019-01-24 (×3): qty 2

## 2019-01-24 MED ORDER — CARVEDILOL 3.125 MG PO TABS
3.1250 mg | ORAL_TABLET | Freq: Two times a day (BID) | ORAL | Status: DC
  Administered 2019-01-24 – 2019-01-25 (×2): 3.125 mg via ORAL
  Filled 2019-01-24 (×2): qty 1

## 2019-01-24 MED ORDER — ADULT MULTIVITAMIN W/MINERALS CH
1.0000 | ORAL_TABLET | Freq: Every day | ORAL | Status: DC
  Administered 2019-01-24 – 2019-01-26 (×3): 1 via ORAL
  Filled 2019-01-24 (×3): qty 1

## 2019-01-24 MED ORDER — LISINOPRIL 10 MG PO TABS
40.0000 mg | ORAL_TABLET | Freq: Every day | ORAL | Status: DC
  Administered 2019-01-24 – 2019-01-26 (×3): 40 mg via ORAL
  Filled 2019-01-24 (×3): qty 4

## 2019-01-24 MED ORDER — CALCIUM CARBONATE-VITAMIN D 500-200 MG-UNIT PO TABS
1.0000 | ORAL_TABLET | Freq: Two times a day (BID) | ORAL | Status: DC
  Administered 2019-01-24 – 2019-01-25 (×3): 1 via ORAL
  Filled 2019-01-24 (×4): qty 1

## 2019-01-24 MED ORDER — OXYBUTYNIN CHLORIDE 5 MG PO TABS
2.5000 mg | ORAL_TABLET | Freq: Two times a day (BID) | ORAL | Status: DC
  Administered 2019-01-24 – 2019-01-26 (×4): 2.5 mg via ORAL
  Filled 2019-01-24 (×4): qty 1

## 2019-01-24 MED ORDER — NICOTINE 21 MG/24HR TD PT24: 21.0000 mg | MEDICATED_PATCH | Freq: Every day | TRANSDERMAL | Status: DC | PRN

## 2019-01-24 MED ORDER — ATORVASTATIN CALCIUM 40 MG PO TABS
40.0000 mg | ORAL_TABLET | Freq: Every day | ORAL | Status: DC
  Administered 2019-01-25: 40 mg via ORAL
  Filled 2019-01-24 (×2): qty 1

## 2019-01-24 NOTE — Evaluation (Signed)
Physical Therapy Evaluation Patient Details Name: Brittney Williams MRN: [id number] DOB: Oct 09, 1945 Today's Date: 01/24/2019   History of Present Illness  Brittney Williams is a 73 y.o. female with medical history significant for emphysema, hypertension, depression and anxiety, was brought to the ED by family with reports of increasing confusion over the past 4 days.  Family believe symptoms started about 2 weeks ago after she passed out and fell down.  Patient did not seek medical attention.  For the past few days patient has been sleeping a lot, having slurred speech and feeling dizzy.  History is obtained from chart review and daughter on the phone.  Patient answers a few simple questions but is otherwise unable to give me a history.  Clinical Impression  Patient found awake and alert in bed upon entering. Patient mod I for all bed mobility transfers and for ambulation. Noted decreased stride length with ambulation, however patient with good foot clearance and balance ambulating 80 feet without assistive device. Patient reported minimal dizziness at the end of the walk, however no loss of balance. Feel that patient may have some confusion, but overall, is safe with mobility. Recommending outpatient physical therapy if patient's dizziness continues for possible vestibular evaluation. No further physical therapy required while in the hospital as patient is functioning near baseline and appears safe.     Follow Up Recommendations Outpatient PT;Other (comment)(Possible outpatient PT if dizziness continues)    Equipment Recommendations  None recommended by PT    Recommendations for Other Services       Precautions / Restrictions Precautions Precautions: Fall Restrictions Weight Bearing Restrictions: No      Mobility  Bed Mobility Overal bed mobility: Modified Independent             General bed mobility comments: Increased time  Transfers Overall transfer level: Modified  independent Equipment used: None             General transfer comment: Patient performed sit to stand with increased time and Mod I.  Ambulation/Gait Ambulation/Gait assistance: Modified independent (Device/Increase time) Gait Distance (Feet): 80 Feet Assistive device: None Gait Pattern/deviations: Decreased stride length Gait velocity: Decreased, but appeared with good balance and foot clearance      Stairs            Wheelchair Mobility    Modified Rankin (Stroke Patients Only)       Balance Overall balance assessment: Independent                                           Pertinent Vitals/Pain Pain Assessment: No/denies pain    Home Living Family/patient expects to be discharged to:: Private residence Living Arrangements: Spouse/significant other Available Help at Discharge: Family;Available PRN/intermittently Type of Home: House Home Access: Ramped entrance     Home Layout: One level Home Equipment: Wheelchair - power Additional Comments: Husband's wheelchair. All information obtained from patient's daughter who called during session.    Prior Function Level of Independence: Independent               Hand Dominance        Extremity/Trunk Assessment   Upper Extremity Assessment Upper Extremity Assessment: Overall WFL for tasks assessed    Lower Extremity Assessment Lower Extremity Assessment: Overall WFL for tasks assessed    Cervical / Trunk Assessment Cervical / Trunk Assessment: Kyphotic  Communication  Communication: HOH  Cognition Arousal/Alertness: Awake/alert Behavior During Therapy: WFL for tasks assessed/performed Overall Cognitive Status: Within Functional Limits for tasks assessed                                 General Comments: Oriented to person, place, and DOB. Not able to respond to what type of home she lives in.      General Comments      Exercises     Assessment/Plan     PT Assessment All further PT needs can be met in the next venue of care  PT Problem List Other (comment);Decreased activity tolerance(Dizziness)       PT Treatment Interventions      PT Goals (Current goals can be found in the Care Plan section)  Acute Rehab PT Goals Patient Stated Goal: Unable to form with paitent. Discussed with her that think she is able to go home. PT Goal Formulation: Patient unable to participate in goal setting Time For Goal Achievement: 01/31/19 Potential to Achieve Goals: Good    Frequency     Barriers to discharge        Co-evaluation               AM-PAC PT "6 Clicks" Mobility  Outcome Measure Help needed turning from your back to your side while in a flat bed without using bedrails?: None Help needed moving from lying on your back to sitting on the side of a flat bed without using bedrails?: None Help needed moving to and from a bed to a chair (including a wheelchair)?: None Help needed standing up from a chair using your arms (e.g., wheelchair or bedside chair)?: None Help needed to walk in hospital room?: None Help needed climbing 3-5 steps with a railing? : A Little 6 Click Score: 23    End of Session Equipment Utilized During Treatment: Gait belt Activity Tolerance: Patient tolerated treatment well;Other (comment)(Patient reported some dizziness at end of ambulation, but no signs of loss of balance or gait deviations) Patient left: in bed;with bed alarm set;with call bell/phone within reach Nurse Communication: Mobility status;Other (comment)(Pure wic removed) PT Visit Diagnosis: Muscle weakness (generalized) (M62.81);Other abnormalities of gait and mobility (R26.89)    Time: 6568-1275 PT Time Calculation (min) (ACUTE ONLY): 26 min   Charges:   PT Evaluation $PT Eval Low Complexity: 1 Low         Clarene Critchley PT, DPT 9:04 AM, 01/24/19 [phone]

## 2019-01-24 NOTE — Progress Notes (Signed)
2 D echo completed 

## 2019-01-24 NOTE — Progress Notes (Addendum)
PROGRESS NOTE    NANDANA KROLIKOWSKI  WCH:[id number]  DOB: 17-May-1946  DOA: 01/23/2019 PCP: Redmond School, MD   Brief Admission Hx: 73 year old female smoker with COPD, hypertension, depression and anxiety presented to the emergency department with increasing confusion and dizziness and near syncopal episodes.  She was found to have a UTI.  MDM/Assessment & Plan:   1. Metabolic encephalopathy- I think this is multifactorial related to excessive alprazolam use and dehydration, uncontrolled blood pressure and urinary tract infection.  Clinically she is improved to baseline.  She is oriented and alert today.  We will continue to monitor and work her up and treat her metabolic problems. 2. Slurred speech-I suspect this was secondary to alprazolam use.  Her speech has improved to normal.  She did have a negative head CT.  Unable to get MRI at this facility over the weekend.  Likely will repeat CT tomorrow.  Continue to monitor. 3. Near syncope-patient has had dizzy episodes over the past 2 weeks.  Unsure if she completely passed out.  Her echocardiogram is suggesting systolic congestive heart failure which apparently is a new finding.  I will ask cardiology to see her on Monday.  She may have coronary artery disease.   4. Systolic CHF-likely chronic due to poorly controlled hypertension however I do worry about coronary artery disease given that she is a longtime smoker.  I will asked the cardiology service to see her on Monday.  She is on lisinopril.  Added Coreg 3.125 mg twice daily.  Continue to monitor telemetry. 5. Accelerated hypertension-patient's blood pressure is poorly controlled.  She has been started on Coreg and resumed on home lisinopril and will adjust treatment for better blood pressure control as needed.  Continue to follow. 6. Smoker with COPD- she was advised to stop all tobacco use.  Will offer nicotine patch to use while in the hospital. 7. GAD-patient has been taking alprazolam  3-5 times per day and this likely is contributing to her slurred speech and falling spells.  We have reduced significantly the doses and she will need to be weaned off this with her outpatient providers.  I spoke with the patient about this she does not seem interested in getting off of Xanax at this time but for her own safety I think that her PCP should consider.   DVT prophylaxis: Lovenox Code Status: Full Family Communication: I spoke with her daughter and spouse by telephone for update Disposition Plan: Continue inpatient treatments   Consultants:    Procedures:  Echocardiogram IMPRESSIONS   1. The left ventricle has moderately reduced systolic function, with an ejection fraction of 35-40%. The cavity size was normal. There is mild concentric left ventricular hypertrophy. Left ventricular diastolic Doppler parameters are consistent with  pseudonormalization. Elevated left ventricular end-diastolic pressure Left ventrical global hypokinesis without regional wall motion abnormalities.  2. The mitral valve is grossly normal.  3. The tricuspid valve is grossly normal.  4. The aortic valve was not well visualized.  5. The aortic root is normal in size and structure.  Antimicrobials:     Subjective: Patient says she is starting to feel much better.  She denies chest pain and shortness of breath.  Objective: Vitals:   01/23/19 2124 01/23/19 2125 01/23/19 2133 01/24/19 0540  BP:  (!) 182/83  (!) 152/76  Pulse:  68  61  Resp:  18  17  Temp:  97.9 F (36.6 C)  98.2 F (36.8 C)  TempSrc:  SpO2:  100%  97%  Weight:   67.9 kg   Height: 5\' 4"  (1.626 m)       Intake/Output Summary (Last 24 hours) at 01/24/2019 1237 Last data filed at 01/24/2019 0900 Gross per 24 hour  Intake 863.07 ml  Output -  Net 863.07 ml   Filed Weights   01/23/19 1348 01/23/19 2133  Weight: 80.3 kg 67.9 kg     REVIEW OF SYSTEMS  As per history otherwise all reviewed and reported negative   Exam:  General exam: Elderly female sitting in bed in no apparent distress awake and alert Respiratory system: Clear. No increased work of breathing. Cardiovascular system: Normal S1 & S2 heard. No JVD. Gastrointestinal system: Abdomen is nondistended, soft and nontender. Normal bowel sounds heard. Central nervous system: Alert and oriented. No focal neurological deficits. Extremities: no cyanosis.  Data Reviewed: Basic Metabolic Panel: Recent Labs  Lab 01/23/19 1603  NA 140  K 3.8  CL 105  CO2 24  GLUCOSE 104*  BUN 18  CREATININE 1.20*  CALCIUM 9.2   Liver Function Tests: Recent Labs  Lab 01/23/19 1603  AST 19  ALT 21  ALKPHOS 61  BILITOT 1.0  PROT 6.0*  ALBUMIN 3.6   No results for input(s): LIPASE, AMYLASE in the last 168 hours. No results for input(s): AMMONIA in the last 168 hours. CBC: Recent Labs  Lab 01/23/19 1603  WBC 4.6  NEUTROABS 2.9  HGB 11.7*  HCT 36.1  MCV 91.2  PLT 235   Cardiac Enzymes: Recent Labs  Lab 01/23/19 1603 01/23/19 2027 01/24/19 0233  TROPONINI <0.03 <0.03 <0.03   CBG (last 3)  No results for input(s): GLUCAP in the last 72 hours. Recent Results (from the past 240 hour(s))  SARS Coronavirus 2 (CEPHEID - Performed in [place] hospital lab), Hosp Order     Status: None   Collection Time: 01/23/19  5:44 PM   Specimen: Nasopharyngeal Swab  Result Value Ref Range Status   SARS Coronavirus 2 NEGATIVE NEGATIVE Final    Comment: (NOTE) If result is NEGATIVE SARS-CoV-2 target nucleic acids are NOT DETECTED. The SARS-CoV-2 RNA is generally detectable in upper and lower  respiratory specimens during the acute phase of infection. The lowest  concentration of SARS-CoV-2 viral copies this assay can detect is 250  copies / mL. A negative result does not preclude SARS-CoV-2 infection  and should not be used as the sole basis for treatment or other  patient management decisions.  A negative result may occur with  improper  specimen collection / handling, submission of specimen other  than nasopharyngeal swab, presence of viral mutation(s) within the  areas targeted by this assay, and inadequate number of viral copies  (<250 copies / mL). A negative result must be combined with clinical  observations, patient history, and epidemiological information. If result is POSITIVE SARS-CoV-2 target nucleic acids are DETECTED. The SARS-CoV-2 RNA is generally detectable in upper and lower  respiratory specimens dur ing the acute phase of infection.  Positive  results are indicative of active infection with SARS-CoV-2.  Clinical  correlation with patient history and other diagnostic information is  necessary to determine patient infection status.  Positive results do  not rule out bacterial infection or co-infection with other viruses. If result is PRESUMPTIVE POSTIVE SARS-CoV-2 nucleic acids MAY BE PRESENT.   A presumptive positive result was obtained on the submitted specimen  and confirmed on repeat testing.  While 2019 novel coronavirus  (SARS-CoV-2) nucleic acids  may be present in the submitted sample  additional confirmatory testing may be necessary for epidemiological  and / or clinical management purposes  to differentiate between  SARS-CoV-2 and other Sarbecovirus currently known to infect humans.  If clinically indicated additional testing with an alternate test  methodology [id number]) is advised. The SARS-CoV-2 RNA is generally  detectable in upper and lower respiratory sp ecimens during the acute  phase of infection. The expected result is Negative. Fact Sheet for Patients:  StrictlyIdeas.no Fact Sheet for Healthcare Providers: BankingDealers.co.za This test is not yet approved or cleared by the [place] FDA and has been authorized for detection and/or diagnosis of SARS-CoV-2 by FDA under an Emergency Use Authorization (EUA).  This EUA will remain in  effect (meaning this test can be used) for the duration of the COVID-19 declaration under Section 564(b)(1) of the Act, 21 U.S.C. section 360bbb-3(b)(1), unless the authorization is terminated or revoked sooner. Performed at [facility], [address] [place], [place] [address]      Studies: Dg Chest 2 View  Result Date: 01/23/2019 CLINICAL DATA:  Confusion over the past 4 days. EXAM: CHEST - 2 VIEW COMPARISON:  CT chest from 06/26/2016 FINDINGS: Mild rightward rotation. The lungs appear clear. Mild lower thoracic spondylosis. Right axillary clips. No blunting of the costophrenic angles. No additional significant findings. IMPRESSION: 1.  No active cardiopulmonary disease is radiographically apparent. Electronically Signed   By: Van Clines M.D.   On: 01/23/2019 17:37   Ct Head Wo Contrast  Result Date: 01/23/2019 CLINICAL DATA:  Confusion for the last 4 days. EXAM: CT HEAD WITHOUT CONTRAST TECHNIQUE: Contiguous axial images were obtained from the base of the skull through the vertex without intravenous contrast. COMPARISON:  Head CT, 06/01/2016 FINDINGS: Brain: No evidence of acute infarction, hemorrhage, hydrocephalus, extra-axial collection or mass lesion/mass effect. There is ventricular and sulcal enlargement reflecting mild generalized atrophy. Patchy areas of white matter hypoattenuation are also noted consistent with moderate chronic microvascular ischemic change. Vascular: No hyperdense vessel or unexpected calcification. Skull: Normal. Negative for fracture or focal lesion. Sinuses/Orbits: Visualized globes and orbits are unremarkable. The visualized sinuses and mastoid air cells are clear. Other: None. IMPRESSION: 1. No acute intracranial abnormalities. 2. Mild atrophy and moderate chronic microvascular ischemic change. Electronically Signed   By: Lajean Manes M.D.   On: 01/23/2019 17:42     Scheduled Meds: . atorvastatin  40 mg Oral q1800  . calcium-vitamin D  1 tablet Oral  BID  . carvedilol  3.125 mg Oral BID WC  . enoxaparin (LOVENOX) injection  40 mg Subcutaneous Q24H  . FLUoxetine  40 mg Oral Daily  . levothyroxine  25 mcg Oral Daily  . lisinopril  40 mg Oral Daily  . multivitamin with minerals  1 tablet Oral Daily  . oxybutynin  2.5 mg Oral BID   Continuous Infusions: . cefTRIAXone (ROCEPHIN)  IV      Active Problems:   Essential hypertension   Hyperlipidemia   GERD (gastroesophageal reflux disease)   AMS (altered mental status)   Syncope and collapse   Confusion   Urinary tract infection without hematuria   Systolic CHF, chronic ([place])   Time spent:   Irwin Brakeman, MD [facility] 01/24/2019, 12:37 PM    LOS: 0 days  How to contact the [facility] Attending or Consulting provider [place] or covering provider during after hours [place], for this patient?  1. Check the care team in [facility] and look for a)  attending/consulting [facility] provider listed and b) the [facility] team listed 2. Log into [email or web address] and use [place]'s universal password to access. If you do not have the password, please contact the hospital operator. 3. Locate the [facility] provider you are looking for under [facility] and page to a number that you can be directly reached. 4. If you still have difficulty reaching the provider, please page the [facility] (Director on Call) for the Hospitalists listed on amion for assistance.

## 2019-01-25 DIAGNOSIS — E7849 Other hyperlipidemia: Secondary | ICD-10-CM | POA: Diagnosis not present

## 2019-01-25 LAB — CBC
HCT: 41.8 % (ref 36.0–46.0)
Hemoglobin: 13.7 g/dL (ref 12.0–15.0)
MCH: 29.7 pg (ref 26.0–34.0)
MCHC: 32.8 g/dL (ref 30.0–36.0)
MCV: 90.5 fL (ref 80.0–100.0)
Platelets: 255 10*3/uL (ref 150–400)
RBC: 4.62 MIL/uL (ref 3.87–5.11)
RDW: 14.7 % (ref 11.5–15.5)
WBC: 6.7 10*3/uL (ref 4.0–10.5)
nRBC: 0 % (ref 0.0–0.2)

## 2019-01-25 LAB — BASIC METABOLIC PANEL
Anion gap: 9 (ref 5–15)
BUN: 15 mg/dL (ref 8–23)
CO2: 26 mmol/L (ref 22–32)
Calcium: 9.8 mg/dL (ref 8.9–10.3)
Chloride: 107 mmol/L (ref 98–111)
Creatinine, Ser: 0.93 mg/dL (ref 0.44–1.00)
GFR calc Af Amer: >60 mL/min (ref >60)
GFR calc non Af Amer: >60 mL/min (ref >60)
Glucose, Bld: 104 mg/dL — ABNORMAL HIGH (ref 70–99)
Potassium: 4 mmol/L (ref 3.5–5.1)
Sodium: 142 mmol/L (ref 135–145)

## 2019-01-25 LAB — HEMOGLOBIN A1C
Hgb A1c MFr Bld: 5.6 % (ref 4.8–5.6)
Mean Plasma Glucose: 114.02 mg/dL

## 2019-01-25 LAB — MAGNESIUM: Magnesium: 1.9 mg/dL (ref 1.7–2.4)

## 2019-01-25 LAB — LIPID PANEL
Cholesterol: 145 mg/dL (ref 0–200)
HDL: 49 mg/dL (ref >40)
LDL Cholesterol: 74 mg/dL (ref 0–99)
Total CHOL/HDL Ratio: 3 RATIO
Triglycerides: 110 mg/dL (ref <150)
VLDL: 22 mg/dL (ref 0–40)

## 2019-01-25 MED ORDER — HYDRALAZINE HCL 20 MG/ML IJ SOLN
10.0000 mg | INTRAMUSCULAR | Status: DC | PRN
  Administered 2019-01-25 – 2019-01-26 (×4): 10 mg via INTRAVENOUS
  Filled 2019-01-25 (×4): qty 1

## 2019-01-25 MED ORDER — CARVEDILOL 3.125 MG PO TABS
6.2500 mg | ORAL_TABLET | Freq: Two times a day (BID) | ORAL | Status: DC
  Administered 2019-01-25 – 2019-01-26 (×2): 6.25 mg via ORAL
  Filled 2019-01-25 (×2): qty 2

## 2019-01-25 NOTE — Progress Notes (Addendum)
PROGRESS NOTE   Brittney Williams  FIE:[id number]  DOB: 1945/08/21  DOA: 01/23/2019 PCP: Redmond School, MD   Brief Admission Hx: 73 year old female smoker with COPD, hypertension, depression and anxiety presented to the emergency department with increasing confusion and dizziness and near syncopal episodes.  She was found to have a UTI.  MDM/Assessment & Plan:   1. Metabolic encephalopathy-RESOLVED NOW.  I think this is multifactorial related to excessive alprazolam use and dehydration, uncontrolled blood pressure and urinary tract infection.  Clinically she is improved to baseline.  She is oriented and alert today.  We will continue to monitor and work her up and treat her metabolic problems. 2. Slurred speech-RESOLVED.  I suspect this was secondary to alprazolam use.  Her speech has improved to normal.  She did have a negative head CT.  Unable to get MRI at this facility over the weekend.  Continue to monitor. 3. CAD -upon further discussion with the patient, she tells me that she saw a cardiologist about 10 years ago and had a catheterization done at that time.  She says that she was told that she had a 45% blockage in 1 of the arteries in her heart.  She has not followed up since then.  She continues to smoke cigarettes.  I am asking the heart care team to see her tomorrow as with her low EF I worry about coronary disease worsening.  She says that when she has her spells she does not have chest pain or shortness of breath however she says she becomes diaphoretic during these episodes. 4. Near syncope-patient has had dizzy episodes over the past 2 weeks.  Unsure if she completely passed out.  Her echocardiogram is suggesting systolic congestive heart failure which apparently is a new finding.  I will ask cardiology to see her on Monday.  She may have coronary artery disease.   5. Systolic CHF-likely chronic due to poorly controlled hypertension however I do worry about coronary artery disease  given that she is a longtime smoker.  I will ask the cardiology service to see her on Monday.  She is on lisinopril.  Added Coreg 6.25 mg twice daily.  Continue to monitor telemetry. 6. Accelerated hypertension-patient's blood pressure is poorly controlled.  She has been started on Coreg and resumed on home lisinopril and will adjust treatment for better blood pressure control as needed.  Today I have increased her Coreg to 6.25 mg twice daily. 7. Smoker with COPD- she was sadvised to stop all tobacco use.  Will offer nicotine patch to use while in the hospital. 8. GAD-patient has been taking alprazolam 3-5 times per day and this likely is contributing to her slurred speech and falling spells.  We have reduced significantly the doses and she will need to be weaned off this with her outpatient providers.  I spoke with the patient about this she does not seem interested in getting off of Xanax at this time but for her own safety I think that her PCP should consider.   DVT prophylaxis: Lovenox Code Status: Full Family Communication: I spoke with her daughter and spouse by telephone for update Disposition Plan: Continue inpatient treatments  Consultants:    Procedures:  Echocardiogram IMPRESSIONS   1. The left ventricle has moderately reduced systolic function, with an ejection fraction of 35-40%. The cavity size was normal. There is mild concentric left ventricular hypertrophy. Left ventricular diastolic Doppler parameters are consistent with  pseudonormalization. Elevated left ventricular end-diastolic pressure Left ventrical global  hypokinesis without regional wall motion abnormalities.  2. The mitral valve is grossly normal.  3. The tricuspid valve is grossly normal.  4. The aortic valve was not well visualized.  5. The aortic root is normal in size and structure.  Antimicrobials:     Subjective: Patient denies that she has had another episode however she has not been ambulating.   She denies headache chest pain and shortness of breath at this time.  She is oriented x3 and she reports no further slurring of the speech.  Objective: Vitals:   01/24/19 1310 01/24/19 2100 01/24/19 2149 01/25/19 0517  BP: 139/78  (!) 160/82 (!) 190/92  Pulse: 69 67 71 75  Resp: 19 16  19   Temp: 98.4 F (36.9 C)  98.3 F (36.8 C) 98.3 F (36.8 C)  TempSrc: Oral  Oral Oral  SpO2: 99% 97% 97% 95%  Weight:      Height:        Intake/Output Summary (Last 24 hours) at 01/25/2019 1039 Last data filed at 01/24/2019 1700 Gross per 24 hour  Intake 480 ml  Output 450 ml  Net 30 ml   Filed Weights   01/23/19 1348 01/23/19 2133  Weight: 80.3 kg 67.9 kg     REVIEW OF SYSTEMS  As per history otherwise all reviewed and reported negative  Exam:  General exam: Elderly female sitting in bed in no apparent distress awake and alert Respiratory system: Clear. No increased work of breathing. Cardiovascular system: Normal S1 & S2 heard. No JVD. Gastrointestinal system: Abdomen is nondistended, soft and nontender. Normal bowel sounds heard. Central nervous system: Alert and oriented. No focal neurological deficits. Extremities: no cyanosis.  Data Reviewed: Basic Metabolic Panel: Recent Labs  Lab 01/23/19 1603 01/25/19 0705  NA 140 142  K 3.8 4.0  CL 105 107  CO2 24 26  GLUCOSE 104* 104*  BUN 18 15  CREATININE 1.20* 0.93  CALCIUM 9.2 9.8  MG  --  1.9   Liver Function Tests: Recent Labs  Lab 01/23/19 1603  AST 19  ALT 21  ALKPHOS 61  BILITOT 1.0  PROT 6.0*  ALBUMIN 3.6   No results for input(s): LIPASE, AMYLASE in the last 168 hours. No results for input(s): AMMONIA in the last 168 hours. CBC: Recent Labs  Lab 01/23/19 1603 01/25/19 0705  WBC 4.6 6.7  NEUTROABS 2.9  --   HGB 11.7* 13.7  HCT 36.1 41.8  MCV 91.2 90.5  PLT 235 255   Cardiac Enzymes: Recent Labs  Lab 01/23/19 1603 01/23/19 2027 01/24/19 0233  TROPONINI <0.03 <0.03 <0.03   CBG (last 3)  No  results for input(s): GLUCAP in the last 72 hours. Recent Results (from the past 240 hour(s))  SARS Coronavirus 2 (CEPHEID - Performed in [facility] hospital lab), Hosp Order     Status: None   Collection Time: 01/23/19  5:44 PM   Specimen: Nasopharyngeal Swab  Result Value Ref Range Status   SARS Coronavirus 2 NEGATIVE NEGATIVE Final    Comment: (NOTE) If result is NEGATIVE SARS-CoV-2 target nucleic acids are NOT DETECTED. The SARS-CoV-2 RNA is generally detectable in upper and lower  respiratory specimens during the acute phase of infection. The lowest  concentration of SARS-CoV-2 viral copies this assay can detect is 250  copies / mL. A negative result does not preclude SARS-CoV-2 infection  and should not be used as the sole basis for treatment or other  patient management decisions.  A negative result  may occur with  improper specimen collection / handling, submission of specimen other  than nasopharyngeal swab, presence of viral mutation(s) within the  areas targeted by this assay, and inadequate number of viral copies  (<250 copies / mL). A negative result must be combined with clinical  observations, patient history, and epidemiological information. If result is POSITIVE SARS-CoV-2 target nucleic acids are DETECTED. The SARS-CoV-2 RNA is generally detectable in upper and lower  respiratory specimens dur ing the acute phase of infection.  Positive  results are indicative of active infection with SARS-CoV-2.  Clinical  correlation with patient history and other diagnostic information is  necessary to determine patient infection status.  Positive results do  not rule out bacterial infection or co-infection with other viruses. If result is PRESUMPTIVE POSTIVE SARS-CoV-2 nucleic acids MAY BE PRESENT.   A presumptive positive result was obtained on the submitted specimen  and confirmed on repeat testing.  While 2019 novel coronavirus  (SARS-CoV-2) nucleic acids may be present in  the submitted sample  additional confirmatory testing may be necessary for epidemiological  and / or clinical management purposes  to differentiate between  SARS-CoV-2 and other Sarbecovirus currently known to infect humans.  If clinically indicated additional testing with an alternate test  methodology [phone]) is advised. The SARS-CoV-2 RNA is generally  detectable in upper and lower respiratory sp ecimens during the acute  phase of infection. The expected result is Negative. Fact Sheet for Patients:  StrictlyIdeas.no Fact Sheet for Healthcare Providers: BankingDealers.co.za This test is not yet approved or cleared by the [place] FDA and has been authorized for detection and/or diagnosis of SARS-CoV-2 by FDA under an Emergency Use Authorization (EUA).  This EUA will remain in effect (meaning this test can be used) for the duration of the COVID-19 declaration under Section 564(b)(1) of the Act, 21 U.S.C. section 360bbb-3(b)(1), unless the authorization is terminated or revoked sooner. Performed at [facility], [address], [place], [place] [address]      Studies: Dg Chest 2 View  Result Date: 01/23/2019 CLINICAL DATA:  Confusion over the past 4 days. EXAM: CHEST - 2 VIEW COMPARISON:  CT chest from 06/26/2016 FINDINGS: Mild rightward rotation. The lungs appear clear. Mild lower thoracic spondylosis. Right axillary clips. No blunting of the costophrenic angles. No additional significant findings. IMPRESSION: 1.  No active cardiopulmonary disease is radiographically apparent. Electronically Signed   By: Van Clines M.D.   On: 01/23/2019 17:37   Ct Head Wo Contrast  Result Date: 01/23/2019 CLINICAL DATA:  Confusion for the last 4 days. EXAM: CT HEAD WITHOUT CONTRAST TECHNIQUE: Contiguous axial images were obtained from the base of the skull through the vertex without intravenous contrast. COMPARISON:  Head CT, 06/01/2016  FINDINGS: Brain: No evidence of acute infarction, hemorrhage, hydrocephalus, extra-axial collection or mass lesion/mass effect. There is ventricular and sulcal enlargement reflecting mild generalized atrophy. Patchy areas of white matter hypoattenuation are also noted consistent with moderate chronic microvascular ischemic change. Vascular: No hyperdense vessel or unexpected calcification. Skull: Normal. Negative for fracture or focal lesion. Sinuses/Orbits: Visualized globes and orbits are unremarkable. The visualized sinuses and mastoid air cells are clear. Other: None. IMPRESSION: 1. No acute intracranial abnormalities. 2. Mild atrophy and moderate chronic microvascular ischemic change. Electronically Signed   By: Lajean Manes M.D.   On: 01/23/2019 17:42   US Carotid Bilateral  Result Date: 01/24/2019 CLINICAL DATA:  73 year old female with a history of altered mental status EXAM: BILATERAL CAROTID DUPLEX ULTRASOUND TECHNIQUE:  Gray scale imaging, color Doppler and duplex ultrasound were performed of bilateral carotid and vertebral arteries in the neck. COMPARISON:  None. FINDINGS: Criteria: Quantification of carotid stenosis is based on velocity parameters that correlate the residual internal carotid diameter with NASCET-based stenosis levels, using the diameter of the distal internal carotid lumen as the denominator for stenosis measurement. The following velocity measurements were obtained: RIGHT ICA:  Systolic 94 cm/sec, Diastolic 20 cm/sec CCA:  82 cm/sec SYSTOLIC ICA/CCA RATIO:  0.8 ECA:  86 cm/sec LEFT ICA:  Systolic 518 cm/sec, Diastolic 30 cm/sec CCA:  79 cm/sec SYSTOLIC ICA/CCA RATIO:  1.0 ECA:  67 cm/sec Right Brachial SBP: Not acquired Left Brachial SBP: Not acquired RIGHT CAROTID ARTERY: No significant calcified disease of the right common carotid artery. Intermediate waveform maintained. Heterogeneous plaque without significant calcifications at the right carotid bifurcation. Low resistance  waveform of the right ICA. No significant tortuosity. RIGHT VERTEBRAL ARTERY: Antegrade flow with low resistance waveform. LEFT CAROTID ARTERY: No significant calcified disease of the left common carotid artery. Intermediate waveform maintained. Heterogeneous plaque at the left carotid bifurcation without significant calcifications. Low resistance waveform of the left ICA. LEFT VERTEBRAL ARTERY:  Antegrade flow with low resistance waveform. IMPRESSION: Color duplex indicates minimal heterogeneous plaque, with no hemodynamically significant stenosis by duplex criteria in the extracranial cerebrovascular circulation. Signed, Dulcy Fanny. Dellia Nims, RPVI [facility] Electronically Signed   By: Corrie Mckusick D.O.   On: 01/24/2019 13:31     Scheduled Meds: . atorvastatin  40 mg Oral q1800  . calcium-vitamin D  1 tablet Oral BID  . carvedilol  6.25 mg Oral BID WC  . enoxaparin (LOVENOX) injection  40 mg Subcutaneous Q24H  . FLUoxetine  40 mg Oral Daily  . levothyroxine  25 mcg Oral Daily  . lisinopril  40 mg Oral Daily  . multivitamin with minerals  1 tablet Oral Daily  . oxybutynin  2.5 mg Oral BID   Continuous Infusions: . cefTRIAXone (ROCEPHIN)  IV 1 g (01/24/19 1957)    Active Problems:   Essential hypertension   Hyperlipidemia   GERD (gastroesophageal reflux disease)   AMS (altered mental status)   Syncope and collapse   Confusion   Urinary tract infection without hematuria   Systolic CHF, chronic ([place])   Time spent:   Irwin Brakeman, MD [facility] 01/25/2019, 10:39 AM    LOS: 1 day  How to contact the [facility] Attending or Consulting provider [place] or covering provider during after hours [place], for this patient?  1. Check the care team in [facility] and look for a) attending/consulting TR[facility] provider listed and b) the [facility] team listed 2. Log into [email or web address] and use [place] universal password to access. If you do not  have the password, please contact the hospital operator. 3. Locate the [facility] provider you are looking for under [facility] and page to a number that you can be directly reached. 4. If you still have difficulty reaching the provider, please page the [facility] (Director on Call) for the Hospitalists listed on amion for assistance.

## 2019-01-26 ENCOUNTER — Other Ambulatory Visit: Payer: Self-pay | Admitting: *Deleted

## 2019-01-26 ENCOUNTER — Inpatient Hospital Stay (HOSPITAL_COMMUNITY): Payer: Medicare Other

## 2019-01-26 DIAGNOSIS — I519 Heart disease, unspecified: Secondary | ICD-10-CM

## 2019-01-26 DIAGNOSIS — R55 Syncope and collapse: Secondary | ICD-10-CM

## 2019-01-26 DIAGNOSIS — I5022 Chronic systolic (congestive) heart failure: Secondary | ICD-10-CM

## 2019-01-26 LAB — VITAMIN D 25 HYDROXY (VIT D DEFICIENCY, FRACTURES): Vit D, 25-Hydroxy: 31.2 ng/mL (ref 30.0–100.0)

## 2019-01-26 LAB — URINE CULTURE: Culture: >=100000 — AB

## 2019-01-26 MED ORDER — SPIRONOLACTONE 25 MG PO TABS
25.0000 mg | ORAL_TABLET | Freq: Every day | ORAL | Status: DC
  Administered 2019-01-26: 25 mg via ORAL
  Filled 2019-01-26: qty 1

## 2019-01-26 MED ORDER — SPIRONOLACTONE 25 MG PO TABS: 25.0000 mg | ORAL_TABLET | Freq: Every day | ORAL | 1 refills | Status: DC

## 2019-01-26 MED ORDER — ALPRAZOLAM 0.5 MG PO TABS: 0.2500 mg | ORAL_TABLET | Freq: Two times a day (BID) | ORAL | 0 refills | Status: DC | PRN

## 2019-01-26 MED ORDER — CARVEDILOL 6.25 MG PO TABS: 6.2500 mg | ORAL_TABLET | Freq: Two times a day (BID) | ORAL | 1 refills | Status: DC

## 2019-01-26 MED ORDER — LISINOPRIL 10 MG PO TABS: 40.0000 mg | ORAL_TABLET | Freq: Every day | ORAL | Status: DC

## 2019-01-26 NOTE — Progress Notes (Signed)
Order placed for event monitor.

## 2019-01-26 NOTE — Evaluation (Signed)
Occupational Therapy Evaluation Patient Details Name: Brittney Williams MRN: [id number] DOB: 06-02-1946 Today's Date: 01/26/2019    History of Present Illness Brittney Williams is a 73 y.o. female with medical history significant for emphysema, hypertension, depression and anxiety, was brought to the ED by family with reports of increasing confusion over the past 4 days.  Family believe symptoms started about 2 weeks ago after she passed out and fell down.  Patient did not seek medical attention.  For the past few days patient has been sleeping a lot, having slurred speech and feeling dizzy.  History is obtained from chart review and daughter on the phone.  Patient answers a few simple questions but is otherwise unable to give me a history.   Clinical Impression   Pt in bed upon therapy arrival and agreeable to participate in OT evaluation. Patient reports that she feels that she is functioning at 75% of her normal. She is currently able to complete all basic ADL tasks with Modified independence and increased time due to mild fatigue from being in the hospital. Patient does a daughter that is available PRN to stop in and assist if needed. At this time, patient does not require any additional OT services at discharge. Thank you for the referral.     Follow Up Recommendations  No OT follow up    Equipment Recommendations  None recommended by OT       Precautions / Restrictions Precautions Precautions: Fall Restrictions Weight Bearing Restrictions: No      Mobility Bed Mobility Overal bed mobility: Independent                Transfers Overall transfer level: Modified independent Equipment used: None             General transfer comment: Patient performed sit to stand with increased time and Mod I.        ADL either performed or assessed with clinical judgement   ADL Overall ADL's : Independent;At baseline              Vision Baseline Vision/History: No visual  deficits Patient Visual Report: No change from baseline              Pertinent Vitals/Pain Pain Assessment: No/denies pain     Hand Dominance Left   Extremity/Trunk Assessment Upper Extremity Assessment Upper Extremity Assessment: Overall WFL for tasks assessed   Lower Extremity Assessment Lower Extremity Assessment: Defer to PT evaluation       Communication Communication Communication: HOH   Cognition Arousal/Alertness: Awake/alert Behavior During Therapy: WFL for tasks assessed/performed Overall Cognitive Status: Within Functional Limits for tasks assessed                Home Living Family/patient expects to be discharged to:: Private residence Living Arrangements: Spouse/significant other Available Help at Discharge: Family;Available PRN/intermittently Type of Home: House Home Access: Ramped entrance     Home Layout: One level     Bathroom Shower/Tub: Tub/shower unit         Home Equipment: Environmental consultant - 2 wheels;Shower seat   Additional Comments: Husband has a powered wheelchair and walker.      Prior Functioning/Environment Level of Independence: Independent        Comments: Pt did not use any DME prior to admission. 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  AM-PAC OT "6 Clicks" Daily Activity     Outcome Measure Help from another person eating meals?: None Help from another person taking care of personal grooming?: None Help from another person toileting, which includes using toliet, bedpan, or urinal?: None Help from another person bathing (including washing, rinsing, drying)?: None Help from another person to put on and taking off regular upper body clothing?: None Help from another person to put on and taking off regular lower body clothing?: None 6 Click Score: 24   End of Session Nurse Communication: Mobility status  Activity Tolerance: Patient tolerated  treatment well Patient left: in chair;with call bell/phone within reach;with chair alarm set;with nursing/sitter in room  OT Visit Diagnosis: Muscle weakness (generalized) (M62.81)                Time: 5597-4163 OT Time Calculation (min): 19 min Charges:  OT General Charges $OT Visit: 1 Visit OT Evaluation $OT Eval Low Complexity: 1 Low  Ailene Ravel, OTR/L,CBIS  [phone]   Sevin Farone, Clarene Duke 01/26/2019, 9:02 AM

## 2019-01-26 NOTE — Progress Notes (Addendum)
PROGRESS NOTE   Brittney Williams  BBC:[id number]  DOB: 1946/07/21  DOA: 01/23/2019 PCP: Redmond School, MD   Brief Admission Hx: 73 year old female smoker with COPD, hypertension, depression and anxiety presented to the emergency department with increasing confusion and dizziness and recurrent syncopal episodes.  She was found to have a UTI.  MDM/Assessment & Plan:   1. Metabolic encephalopathy-RESOLVED NOW.  I think this is multifactorial related to excessive alprazolam use and dehydration, uncontrolled blood pressure and urinary tract infection.  Clinically she is improved to baseline.  2. Slurred speech-RESOLVED.  I suspect this was secondary to alprazolam use.  Her speech has improved to normal.  She did have a negative head CT.  MRI brain today, unable to get over weekend at this facility.  3. Klebsiella pneumonia UTI - sensitive to ceftriaxone.  This has been treated.  4. CAD -upon further discussion with the patient, she tells me that she saw a cardiologist about 10 years ago and had a catheterization done at that time.  She says that she was told that she had a 45% blockage in 1 of the arteries in her heart.  She has not followed up since then.  She continues to smoke cigarettes.  Cardiology to see her today.   She says that when she has her spells she does not have chest pain or shortness of breath however she says she becomes diaphoretic during these episodes. 5. Near syncope-patient has had dizzy episodes over the past 2 weeks.  Unsure if she completely passed out.  Her echocardiogram is suggesting systolic congestive heart failure which apparently is a new finding. Cardiology to see.   6. Systolic CHF-likely chronic due to poorly controlled hypertension however I do worry about coronary artery disease given that she is a longtime smoker.  I will ask the cardiology service to see her on Monday.  She is on lisinopril.  Added Coreg 6.25 mg twice daily.  Continue to monitor  telemetry. 7. Accelerated hypertension-patient's blood pressure is poorly controlled.  She has been started on Coreg and resumed on home lisinopril and will adjust treatment for better blood pressure control as needed.  On 6/14, increased her Coreg to 6.25 mg twice daily. 8. Smoker with COPD- she was counseled to stop all tobacco use.  We offer nicotine patch to use while in the hospital. 9. GAD-patient has been taking alprazolam 3-5 times per day and this likely is contributing to her slurred speech and falling spells.  We have reduced significantly the doses and she will need to be weaned off this with her outpatient providers.  I spoke with the patient about this she does not seem interested in getting off of Xanax at this time but for her own safety I think that her PCP should consider.   DVT prophylaxis: Lovenox Code Status: Full Family Communication: I spoke with her daughter and spouse by telephone for update Disposition Plan: Continue inpatient treatments  Consultants:    Procedures:  Echocardiogram IMPRESSIONS   1. The left ventricle has moderately reduced systolic function, with an ejection fraction of 35-40%. The cavity size was normal. There is mild concentric left ventricular hypertrophy. Left ventricular diastolic Doppler parameters are consistent with  pseudonormalization. Elevated left ventricular end-diastolic pressure Left ventrical global hypokinesis without regional wall motion abnormalities.  2. The mitral valve is grossly normal.  3. The tricuspid valve is grossly normal.  4. The aortic valve was not well visualized.  5. The aortic root is normal in size  and structure.  Antimicrobials:     Subjective: Patient denies complains, denies headache, chest pain, SOB, fever, chills.  No syncopal episodes.   Objective: Vitals:   01/25/19 2127 01/26/19 0556 01/26/19 0617 01/26/19 0659  BP: (!) 181/74 (!) 225/103 (!) 202/118 (!) 169/72  Pulse: 73 75  94  Resp: 17  18    Temp: 98.6 F (37 C) 98.6 F (37 C)    TempSrc: Oral Oral    SpO2: 97% 100%    Weight:      Height:        Intake/Output Summary (Last 24 hours) at 01/26/2019 0933 Last data filed at 01/26/2019 0700 Gross per 24 hour  Intake 480 ml  Output 300 ml  Net 180 ml   Filed Weights   01/23/19 1348 01/23/19 2133  Weight: 80.3 kg 67.9 kg   REVIEW OF SYSTEMS  As per history otherwise all reviewed and reported negative  Exam:  General exam: Elderly female sitting in bed in no apparent distress awake and alert Respiratory system: Clear. No increased work of breathing. Cardiovascular system: Normal S1 & S2 heard. No JVD. Gastrointestinal system: Abdomen is nondistended, soft and nontender. Normal bowel sounds heard. Central nervous system: Alert and oriented. No focal neurological deficits. Extremities: no cyanosis.  Data Reviewed: Basic Metabolic Panel: Recent Labs  Lab 01/23/19 1603 01/25/19 0705  NA 140 142  K 3.8 4.0  CL 105 107  CO2 24 26  GLUCOSE 104* 104*  BUN 18 15  CREATININE 1.20* 0.93  CALCIUM 9.2 9.8  MG  --  1.9   Liver Function Tests: Recent Labs  Lab 01/23/19 1603  AST 19  ALT 21  ALKPHOS 61  BILITOT 1.0  PROT 6.0*  ALBUMIN 3.6   No results for input(s): LIPASE, AMYLASE in the last 168 hours. No results for input(s): AMMONIA in the last 168 hours. CBC: Recent Labs  Lab 01/23/19 1603 01/25/19 0705  WBC 4.6 6.7  NEUTROABS 2.9  --   HGB 11.7* 13.7  HCT 36.1 41.8  MCV 91.2 90.5  PLT 235 255   Cardiac Enzymes: Recent Labs  Lab 01/23/19 1603 01/23/19 2027 01/24/19 0233  TROPONINI <0.03 <0.03 <0.03   CBG (last 3)  No results for input(s): GLUCAP in the last 72 hours. Recent Results (from the past 240 hour(s))  Urine culture     Status: Abnormal   Collection Time: 01/23/19  4:17 PM   Specimen: Urine, Clean Catch  Result Value Ref Range Status   Specimen Description   Final    URINE, CLEAN CATCH Performed at [facility],  [address] [place], [place] [address]    Special Requests   Final    NONE Performed at [facility], [address], [place], [place] [address]    Culture >=100,000 COLONIES/mL KLEBSIELLA PNEUMONIAE (A)  Final   Report Status 01/26/2019 FINAL  Final   Organism ID, Bacteria KLEBSIELLA PNEUMONIAE (A)  Final      Susceptibility   Klebsiella pneumoniae - MIC*    AMPICILLIN >=32 RESISTANT Resistant     CEFAZOLIN <=4 SENSITIVE Sensitive     CEFTRIAXONE <=1 SENSITIVE Sensitive     CIPROFLOXACIN <=0.25 SENSITIVE Sensitive     GENTAMICIN <=1 SENSITIVE Sensitive     IMIPENEM <=0.25 SENSITIVE Sensitive     NITROFURANTOIN 32 SENSITIVE Sensitive     TRIMETH/SULFA <=20 SENSITIVE Sensitive     AMPICILLIN/SULBACTAM 8 SENSITIVE Sensitive     PIP/TAZO 8 SENSITIVE Sensitive  Extended ESBL NEGATIVE Sensitive     * >=100,000 COLONIES/mL KLEBSIELLA PNEUMONIAE  SARS Coronavirus 2 (CEPHEID - Performed in [facility] hospital lab), Hosp Order     Status: None   Collection Time: 01/23/19  5:44 PM   Specimen: Nasopharyngeal Swab  Result Value Ref Range Status   SARS Coronavirus 2 NEGATIVE NEGATIVE Final    Comment: (NOTE) If result is NEGATIVE SARS-CoV-2 target nucleic acids are NOT DETECTED. The SARS-CoV-2 RNA is generally detectable in upper and lower  respiratory specimens during the acute phase of infection. The lowest  concentration of SARS-CoV-2 viral copies this assay can detect is 250  copies / mL. A negative result does not preclude SARS-CoV-2 infection  and should not be used as the sole basis for treatment or other  patient management decisions.  A negative result may occur with  improper specimen collection / handling, submission of specimen other  than nasopharyngeal swab, presence of viral mutation(s) within the  areas targeted by this assay, and inadequate number of viral copies  (<250 copies / mL). A negative result must be combined with clinical  observations, patient history, and  epidemiological information. If result is POSITIVE SARS-CoV-2 target nucleic acids are DETECTED. The SARS-CoV-2 RNA is generally detectable in upper and lower  respiratory specimens dur ing the acute phase of infection.  Positive  results are indicative of active infection with SARS-CoV-2.  Clinical  correlation with patient history and other diagnostic information is  necessary to determine patient infection status.  Positive results do  not rule out bacterial infection or co-infection with other viruses. If result is PRESUMPTIVE POSTIVE SARS-CoV-2 nucleic acids MAY BE PRESENT.   A presumptive positive result was obtained on the submitted specimen  and confirmed on repeat testing.  While 2019 novel coronavirus  (SARS-CoV-2) nucleic acids may be present in the submitted sample  additional confirmatory testing may be necessary for epidemiological  and / or clinical management purposes  to differentiate between  SARS-CoV-2 and other Sarbecovirus currently known to infect humans.  If clinically indicated additional testing with an alternate test  methodology [phone]) is advised. The SARS-CoV-2 RNA is generally  detectable in upper and lower respiratory sp ecimens during the acute  phase of infection. The expected result is Negative. Fact Sheet for Patients:  StrictlyIdeas.no Fact Sheet for Healthcare Providers: BankingDealers.co.za This test is not yet approved or cleared by the [place] FDA and has been authorized for detection and/or diagnosis of SARS-CoV-2 by FDA under an Emergency Use Authorization (EUA).  This EUA will remain in effect (meaning this test can be used) for the duration of the COVID-19 declaration under Section 564(b)(1) of the Act, 21 U.S.C. section 360bbb-3(b)(1), unless the authorization is terminated or revoked sooner. Performed at [facility], [address] [place], [place] [address]      Studies: US  Carotid Bilateral  Result Date: 01/24/2019 CLINICAL DATA:  73 year old female with a history of altered mental status EXAM: BILATERAL CAROTID DUPLEX ULTRASOUND TECHNIQUE: Pearline Cables scale imaging, color Doppler and duplex ultrasound were performed of bilateral carotid and vertebral arteries in the neck. COMPARISON:  None. FINDINGS: Criteria: Quantification of carotid stenosis is based on velocity parameters that correlate the residual internal carotid diameter with NASCET-based stenosis levels, using the diameter of the distal internal carotid lumen as the denominator for stenosis measurement. The following velocity measurements were obtained: RIGHT ICA:  Systolic 94 cm/sec, Diastolic 20 cm/sec CCA:  82 cm/sec SYSTOLIC ICA/CCA RATIO:  0.8 ECA:  86  cm/sec LEFT ICA:  Systolic 833 cm/sec, Diastolic 30 cm/sec CCA:  79 cm/sec SYSTOLIC ICA/CCA RATIO:  1.0 ECA:  67 cm/sec Right Brachial SBP: Not acquired Left Brachial SBP: Not acquired RIGHT CAROTID ARTERY: No significant calcified disease of the right common carotid artery. Intermediate waveform maintained. Heterogeneous plaque without significant calcifications at the right carotid bifurcation. Low resistance waveform of the right ICA. No significant tortuosity. RIGHT VERTEBRAL ARTERY: Antegrade flow with low resistance waveform. LEFT CAROTID ARTERY: No significant calcified disease of the left common carotid artery. Intermediate waveform maintained. Heterogeneous plaque at the left carotid bifurcation without significant calcifications. Low resistance waveform of the left ICA. LEFT VERTEBRAL ARTERY:  Antegrade flow with low resistance waveform. IMPRESSION: Color duplex indicates minimal heterogeneous plaque, with no hemodynamically significant stenosis by duplex criteria in the extracranial cerebrovascular circulation. Signed, Dulcy Fanny. Dellia Nims, RPVI [facility] Electronically Signed   By: Corrie Mckusick D.O.   On:  01/24/2019 13:31   Scheduled Meds:  atorvastatin  40 mg Oral q1800   calcium-vitamin D  1 tablet Oral BID   carvedilol  6.25 mg Oral BID WC   enoxaparin (LOVENOX) injection  40 mg Subcutaneous Q24H   FLUoxetine  40 mg Oral Daily   levothyroxine  25 mcg Oral Daily   lisinopril  40 mg Oral Daily   multivitamin with minerals  1 tablet Oral Daily   oxybutynin  2.5 mg Oral BID   spironolactone  25 mg Oral Daily   Continuous Infusions:  cefTRIAXone (ROCEPHIN)  IV 1 g (01/25/19 1609)    Active Problems:   Essential hypertension   Hyperlipidemia   GERD (gastroesophageal reflux disease)   AMS (altered mental status)   Syncope and collapse   Confusion   Urinary tract infection without hematuria   Systolic CHF, chronic ([place])  Time spent:   Irwin Brakeman, MD [facility] 01/26/2019, 9:33 AM    LOS: 2 days  How to contact the [facility] Attending or Consulting provider [place] or covering provider during after hours [place], for this patient?  1. Check the care team in [facility] and look for a) attending/consulting [facility] provider listed and b) the [facility] team listed 2. Log into [email or web address] and use [place] universal password to access. If you do not have the password, please contact the hospital operator. 3. Locate the [facility] provider you are looking for under [facility] and page to a number that you can be directly reached. 4. If you still have difficulty reaching the provider, please page the [facility] (Director on Call) for the Hospitalists listed on amion for assistance.

## 2019-01-26 NOTE — Care Management Important Message (Signed)
Important Message  Patient Details  Name: Brittney Williams MRN: [id number] Date of Birth: 10-14-1945   Medicare Important Message Given:  Yes    Tommy Medal 01/26/2019, 4:00 PM

## 2019-01-26 NOTE — Discharge Instructions (Signed)
A - 30 day event monitor will be provided to you to wear.  If you don't receive in the hospital before discharge it will be mailed to your home.     IMPORTANT INFORMATION: PAY CLOSE ATTENTION   PHYSICIAN DISCHARGE INSTRUCTIONS  Follow with Primary care provider  Redmond School, MD  and other consultants as instructed by your Hospitalist Physician  [place] IF SYMPTOMS COME BACK, WORSEN OR NEW PROBLEM DEVELOPS   Please note: You were cared for by a hospitalist during your hospital stay. Every effort will be made to forward records to your primary care provider.  You can request that your primary care provider send for your hospital records if they have not received them.  Once you are discharged, your primary care physician will handle any further medical issues. Please note that NO REFILLS for any discharge medications will be authorized once you are discharged, as it is imperative that you return to your primary care physician (or establish a relationship with a primary care physician if you do not have one) for your post hospital discharge needs so that they can reassess your need for medications and monitor your lab values.  Please get a complete blood count and chemistry panel checked by your Primary MD at your next visit, and again as instructed by your Primary MD.  Get Medicines reviewed and adjusted: Please take all your medications with you for your next visit with your Primary MD  Laboratory/radiological data: Please request your Primary MD to go over all hospital tests and procedure/radiological results at the follow up, please ask your primary care provider to get all Hospital records sent to his/her office.  In some cases, they will be blood work, cultures and biopsy results pending at the time of your discharge. Please request that your primary care provider follow up on these results.  If you are diabetic, please bring your blood sugar  readings with you to your follow up appointment with primary care.    Please call and make your follow up appointments as soon as possible.    Also Note the following: If you experience worsening of your admission symptoms, develop shortness of breath, life threatening emergency, suicidal or homicidal thoughts you must seek medical attention immediately by calling 911 or calling your MD immediately  if symptoms less severe.  You must read complete instructions/literature along with all the possible adverse reactions/side effects for all the Medicines you take and that have been prescribed to you. Take any new Medicines after you have completely understood and accpet all the possible adverse reactions/side effects.   Do not drive when taking Pain medications or sleeping medications (Benzodiazepines)  Do not take more than prescribed Pain, Sleep and Anxiety Medications. It is not advisable to combine anxiety,sleep and pain medications without talking with your primary care practitioner  Special Instructions: If you have smoked or chewed Tobacco  in the last 2 yrs please stop smoking, stop any regular Alcohol  and or any Recreational drug use.  Wear Seat belts while driving.  Do not drive if taking any narcotic, mind altering or controlled substances or recreational drugs or alcohol.

## 2019-01-26 NOTE — Discharge Summary (Addendum)
Physician Discharge Summary  Brittney Williams GQQ:[id number] DOB: 04-25-1946 DOA: 01/23/2019  PCP: Redmond School, MD Cardiology: Johnsie Cancel   Admit date: 01/23/2019 Discharge date: 01/26/2019  Admitted From:  Home  Disposition:  Home   Recommendations for Outpatient Follow-up:  1. Follow up with PCP in 1 weeks 2. Please arrange outpatient PT if dizziness returns  3. Follow up with cardiology on 7/17 at 10:15 am as scheduled with Dr. Johnsie Cancel 4. Please obtain BMP in 1-2 weeks to check electrolytes  5. 30 day event monitor has been arranged by cardiology service. 6. Outpatient cardiac cath to be arranged by cardiology service 7. Please try to wean patient off alprazolam.   Discharge Condition: STABLE   CODE STATUS: FULL    Brief Hospitalization Summary: Please see all hospital notes, images, labs for full details of the hospitalization. HPI: Brittney Williams is a 73 y.o. female with medical history significant for emphysema, hypertension, depression and anxiety, was brought to the ED by family with reports of increasing confusion over the past 4 days.  Family believe symptoms started about 2 weeks ago after she passed out and fell down.  Patient did not seek medical attention.  For the past few days patient has been sleeping a lot, having slurred speech and feeling dizzy.  History is obtained from chart review and daughter on the phone.  Patient answers a few simple questions but is otherwise unable to give me a history.   Daughter tells me 2 weeks ago patient went out to check the mail, and passed out in her neighbor's front yard.  EMS was called, patient's blood pressure was elevated but patient did not want to come to the hospital.  Daughter tells me patient has been having dizzy spells.  But no episodes of passing out in the past.  No significant memory problems, at baseline walks mostly unassisted.  Complaints of pain no reported fevers or chills.  He believes her speech is still slowed and has  been slowed over the past 4 days.  Today she was walking and stumbled but no reported focal weakness of any of her extremities.  ED Course: Heart rate 46-58, blood pressure systolic 671I to 458K.  O2 sats greater than 98% on room air.  WBC 4.6.  Normal lactic acid 0.8.  Creatinine 1.2 about baseline.  UA positive leukocytes and nitrites.  Head CT and 2 view chest x-ray negative for acute abnormality.  Patient started on IV ceftriaxone 1 g for UTI.  Hospitalist to admit for altered mental status syncope and slurred speech.  Brief Admission Hx: 73 year old female smoker with COPD, hypertension, depression and anxiety presented to the emergency department with increasing confusion and dizziness and recurrent syncopal episodes.  She was found to have a UTI.  MDM/Assessment & Plan:   1. Metabolic encephalopathy-RESOLVED NOW.  I think this is multifactorial related to excessive alprazolam use and dehydration, uncontrolled blood pressure and urinary tract infection.  Clinically she is improved to baseline.  2. Slurred speech-RESOLVED.  I suspect this was secondary to alprazolam use.  Her speech has improved to normal.  She did have a negative head CT.  MRI brain with no acute findings.   3. Klebsiella pneumonia UTI - sensitive to ceftriaxone.  This has been treated.  4. CAD -upon further discussion with the patient, she tells me that she saw a cardiologist about 10 years ago and had a catheterization done at that time.  She says that she was told that she had  a 45% blockage in 1 of the arteries in her heart.  She has not followed up since then.  She continues to smoke cigarettes.  Cardiology recommends outpatient testing - will arrange a cath outpatient and arranged for a 30 day event monitor.    She says that when she has her spells she does not have chest pain or shortness of breath however she says she becomes diaphoretic during these episodes. 5. Near syncope-patient has had dizzy episodes over the past  2 weeks.  Unsure if she completely passed out.  Her echocardiogram is suggesting systolic congestive heart failure which apparently is a new finding. Cardiology arranging a 30 day event monitor and outpatient cath.   6. Systolic CHF-likely chronic due to poorly controlled hypertension however I do worry about coronary artery disease given that she is a longtime smoker.  I will ask the cardiology service to see her on Monday.  She is on lisinopril.  Added Coreg 6.25 mg twice daily.  Continue to monitor telemetry.  Cardiology added spironolactone 25 mg daily.  7. Accelerated hypertension-patient's blood pressure is poorly controlled.  She has been started on Coreg and resumed on home lisinopril and will adjust treatment for better blood pressure control as needed.  On 6/14, increased her Coreg to 6.25 mg twice daily. 8. Smoker with COPD- she was counseled to stop all tobacco use.  We offer nicotine patch to use while in the hospital. 9. GAD-patient has been taking alprazolam 3-5 times per day and this likely is contributing to her slurred speech and falling spells.  We have reduced significantly the doses and she will need to be weaned off this with her outpatient providers.  I spoke with the patient about this she does not seem interested in getting off of Xanax at this time but for her own safety I think that her PCP should consider.   DVT prophylaxis: Lovenox Code Status: Full Family Communication: I spoke with her daughter and spouse by telephone for update Disposition Plan: Continue inpatient treatments  Consultants:    Procedures:  Echocardiogram IMPRESSIONS  1. The left ventricle has moderately reduced systolic function, with an ejection fraction of 35-40%. The cavity size was normal. There is mild concentric left ventricular hypertrophy. Left ventricular diastolic Doppler parameters are consistent with  pseudonormalization. Elevated left ventricular end-diastolic pressure Left  ventrical global hypokinesis without regional wall motion abnormalities. 2. The mitral valve is grossly normal. 3. The tricuspid valve is grossly normal. 4. The aortic valve was not well visualized. 5. The aortic root is normal in size and structure.  Antimicrobials:     Discharge Diagnoses:  Active Problems:   Essential hypertension   Hyperlipidemia   GERD (gastroesophageal reflux disease)   AMS (altered mental status)   Syncope and collapse   Confusion   Urinary tract infection without hematuria   Systolic CHF, chronic (HCC)   Discharge Instructions: Discharge Instructions    (HEART FAILURE PATIENTS) Call MD:  Anytime you have any of the following symptoms: 1) 3 pound weight gain in 24 hours or 5 pounds in 1 week 2) shortness of breath, with or without a dry hacking cough 3) swelling in the hands, feet or stomach 4) if you have to sleep on extra pillows at night in order to breathe.   Complete by: As directed    Call MD for:  difficulty breathing, headache or visual disturbances   Complete by: As directed    Call MD for:  extreme fatigue  Complete by: As directed    Call MD for:  persistant dizziness or light-headedness   Complete by: As directed    Call MD for:  persistant nausea and vomiting   Complete by: As directed    Diet - low sodium heart healthy   Complete by: As directed    Increase activity slowly   Complete by: As directed      Allergies as of 01/26/2019      Reactions   Bee Venom Anaphylaxis   Codeine Hives      Medication List    TAKE these medications   ALPRAZolam 0.5 MG tablet Commonly known as: XANAX Take 0.5 tablets (0.25 mg total) by mouth 2 (two) times daily as needed for anxiety. What changed:   how much to take  Another medication with the same name was removed. Continue taking this medication, and follow the directions you see here.   atorvastatin 40 MG tablet Commonly known as: LIPITOR Take 40 mg by mouth daily.    carvedilol 6.25 MG tablet Commonly known as: COREG Take 1 tablet (6.25 mg total) by mouth 2 (two) times daily with a meal.   dimenhyDRINATE 50 MG tablet Commonly known as: DRAMAMINE Take 50 mg by mouth every 8 (eight) hours as needed for dizziness.   FLUoxetine 40 MG capsule Commonly known as: PROZAC Take 40 mg by mouth daily.   ibuprofen 800 MG tablet Commonly known as: ADVIL Take 800 mg by mouth every 8 (eight) hours as needed for mild pain or moderate pain.   levothyroxine 25 MCG tablet Commonly known as: SYNTHROID Take 25 mcg by mouth daily.   lisinopril 40 MG tablet Commonly known as: ZESTRIL Take 40 mg by mouth daily.   multivitamin with minerals Tabs tablet Take 1 tablet by mouth daily.   oxybutynin 5 MG tablet Commonly known as: DITROPAN Take 5 mg by mouth 2 (two) times a day.   SM CALCIUM 500/VITAMIN D3 PO Take 500 mg by mouth 2 (two) times daily.   spironolactone 25 MG tablet Commonly known as: ALDACTONE Take 1 tablet (25 mg total) by mouth daily. Start taking on: January 27, 2019      Follow-up Information    Redmond School, MD. Schedule an appointment as soon as possible for a visit in 1 week(s).   Specialty: Internal Medicine Why: 1 week  Contact information: [address] [place] [address] [phone]        Jenkins Rouge MD. Go on 02/27/2019.   Why: 10:15 am as already scheduled Contact information: [facility] [place]          Allergies  Allergen Reactions  . Bee Venom Anaphylaxis  . Codeine Hives   Allergies as of 01/26/2019      Reactions   Bee Venom Anaphylaxis   Codeine Hives      Medication List    TAKE these medications   ALPRAZolam 0.5 MG tablet Commonly known as: XANAX Take 0.5 tablets (0.25 mg total) by mouth 2 (two) times daily as needed for anxiety. What changed:   how much to take  Another medication with the same name was removed. Continue taking this medication, and follow the directions you  see here.   atorvastatin 40 MG tablet Commonly known as: LIPITOR Take 40 mg by mouth daily.   carvedilol 6.25 MG tablet Commonly known as: COREG Take 1 tablet (6.25 mg total) by mouth 2 (two) times daily with a meal.   dimenhyDRINATE 50 MG tablet Commonly  known as: DRAMAMINE Take 50 mg by mouth every 8 (eight) hours as needed for dizziness.   FLUoxetine 40 MG capsule Commonly known as: PROZAC Take 40 mg by mouth daily.   ibuprofen 800 MG tablet Commonly known as: ADVIL Take 800 mg by mouth every 8 (eight) hours as needed for mild pain or moderate pain.   levothyroxine 25 MCG tablet Commonly known as: SYNTHROID Take 25 mcg by mouth daily.   lisinopril 40 MG tablet Commonly known as: ZESTRIL Take 40 mg by mouth daily.   multivitamin with minerals Tabs tablet Take 1 tablet by mouth daily.   oxybutynin 5 MG tablet Commonly known as: DITROPAN Take 5 mg by mouth 2 (two) times a day.   SM CALCIUM 500/VITAMIN D3 PO Take 500 mg by mouth 2 (two) times daily.   spironolactone 25 MG tablet Commonly known as: ALDACTONE Take 1 tablet (25 mg total) by mouth daily. Start taking on: January 27, 2019       Procedures/Studies: Dg Chest 2 View  Result Date: 01/23/2019 CLINICAL DATA:  Confusion over the past 4 days. EXAM: CHEST - 2 VIEW COMPARISON:  CT chest from 06/26/2016 FINDINGS: Mild rightward rotation. The lungs appear clear. Mild lower thoracic spondylosis. Right axillary clips. No blunting of the costophrenic angles. No additional significant findings. IMPRESSION: 1.  No active cardiopulmonary disease is radiographically apparent. Electronically Signed   By: Van Clines M.D.   On: 01/23/2019 17:37   Ct Head Wo Contrast  Result Date: 01/23/2019 CLINICAL DATA:  Confusion for the last 4 days. EXAM: CT HEAD WITHOUT CONTRAST TECHNIQUE: Contiguous axial images were obtained from the base of the skull through the vertex without intravenous contrast. COMPARISON:  Head CT,  06/01/2016 FINDINGS: Brain: No evidence of acute infarction, hemorrhage, hydrocephalus, extra-axial collection or mass lesion/mass effect. There is ventricular and sulcal enlargement reflecting mild generalized atrophy. Patchy areas of white matter hypoattenuation are also noted consistent with moderate chronic microvascular ischemic change. Vascular: No hyperdense vessel or unexpected calcification. Skull: Normal. Negative for fracture or focal lesion. Sinuses/Orbits: Visualized globes and orbits are unremarkable. The visualized sinuses and mastoid air cells are clear. Other: None. IMPRESSION: 1. No acute intracranial abnormalities. 2. Mild atrophy and moderate chronic microvascular ischemic change. Electronically Signed   By: Lajean Manes M.D.   On: 01/23/2019 17:42   Mr Brain Wo Contrast  Result Date: 01/26/2019 CLINICAL DATA:  Altered mental status over the last 4 days. EXAM: MRI HEAD WITHOUT CONTRAST TECHNIQUE: Multiplanar, multiecho pulse sequences of the brain and surrounding structures were obtained without intravenous contrast. COMPARISON:  Head CT 01/23/2019 FINDINGS: Brain: Diffusion imaging does not show any acute or subacute infarction. There are extensive chronic small-vessel ischemic changes throughout the pons. No focal cerebellar insult. Cerebral hemispheres show moderate to marked chronic small-vessel ischemic changes of the deep and subcortical white matter. No cortical or large vessel territory insult. No mass lesion, hemorrhage, hydrocephalus or extra-axial collection. Vascular: Major vessels at the base of the brain show flow. Skull and upper cervical spine: Negative Sinuses/Orbits: Clear/normal Other: None IMPRESSION: No acute or reversible finding. No abnormality seen to explain a recent clinical deterioration. There are extensive chronic small-vessel ischemic changes of the brainstem and cerebral hemispheric white matter. Electronically Signed   By: Nelson Chimes M.D.   On: 01/26/2019  09:51   US Carotid Bilateral  Result Date: 01/24/2019 CLINICAL DATA:  73 year old female with a history of altered mental status EXAM: BILATERAL CAROTID DUPLEX ULTRASOUND TECHNIQUE: Pearline Cables scale  imaging, color Doppler and duplex ultrasound were performed of bilateral carotid and vertebral arteries in the neck. COMPARISON:  None. FINDINGS: Criteria: Quantification of carotid stenosis is based on velocity parameters that correlate the residual internal carotid diameter with NASCET-based stenosis levels, using the diameter of the distal internal carotid lumen as the denominator for stenosis measurement. The following velocity measurements were obtained: RIGHT ICA:  Systolic 94 cm/sec, Diastolic 20 cm/sec CCA:  82 cm/sec SYSTOLIC ICA/CCA RATIO:  0.8 ECA:  86 cm/sec LEFT ICA:  Systolic 884 cm/sec, Diastolic 30 cm/sec CCA:  79 cm/sec SYSTOLIC ICA/CCA RATIO:  1.0 ECA:  67 cm/sec Right Brachial SBP: Not acquired Left Brachial SBP: Not acquired RIGHT CAROTID ARTERY: No significant calcified disease of the right common carotid artery. Intermediate waveform maintained. Heterogeneous plaque without significant calcifications at the right carotid bifurcation. Low resistance waveform of the right ICA. No significant tortuosity. RIGHT VERTEBRAL ARTERY: Antegrade flow with low resistance waveform. LEFT CAROTID ARTERY: No significant calcified disease of the left common carotid artery. Intermediate waveform maintained. Heterogeneous plaque at the left carotid bifurcation without significant calcifications. Low resistance waveform of the left ICA. LEFT VERTEBRAL ARTERY:  Antegrade flow with low resistance waveform. IMPRESSION: Color duplex indicates minimal heterogeneous plaque, with no hemodynamically significant stenosis by duplex criteria in the extracranial cerebrovascular circulation. Signed, Dulcy Fanny. Dellia Nims, RPVI [facility] Electronically Signed   By: Corrie Mckusick D.O.   On: 01/24/2019 13:31      Subjective: Pt reports no chest pain, dizziness or shortness of breath.  No speech slurring.   Discharge Exam: Vitals:   01/26/19 1120 01/26/19 1200  BP: (!) 167/75 (!) 128/55  Pulse: 83 84  Resp:    Temp:    SpO2:     Vitals:   01/26/19 0617 01/26/19 0659 01/26/19 1120 01/26/19 1200  BP: (!) 202/118 (!) 169/72 (!) 167/75 (!) 128/55  Pulse:  94 83 84  Resp:      Temp:      TempSrc:      SpO2:      Weight:      Height:       General exam: Elderly female sitting in bed in no apparent distress awake and alert Respiratory system: Clear. No increased work of breathing. Cardiovascular system: Normal S1 & S2 heard. No JVD. Gastrointestinal system: Abdomen is nondistended, soft and nontender. Normal bowel sounds heard. Central nervous system: Alert and oriented. No focal neurological deficits. Extremities: no cyanosis.  The results of significant diagnostics from this hospitalization (including imaging, microbiology, ancillary and laboratory) are listed below for reference.     Microbiology: Recent Results (from the past 240 hour(s))  Urine culture     Status: Abnormal   Collection Time: 01/23/19  4:17 PM   Specimen: Urine, Clean Catch  Result Value Ref Range Status   Specimen Description   Final    URINE, CLEAN CATCH Performed at [facility], [address] [place], [place] [address]    Special Requests   Final    NONE Performed at [facility], [address], [place], [place] [address]    Culture >=100,000 COLONIES/mL KLEBSIELLA PNEUMONIAE (A)  Final   Report Status 01/26/2019 FINAL  Final   Organism ID, Bacteria KLEBSIELLA PNEUMONIAE (A)  Final      Susceptibility   Klebsiella pneumoniae - MIC*    AMPICILLIN >=32 RESISTANT Resistant     CEFAZOLIN <=4 SENSITIVE Sensitive     CEFTRIAXONE <=1 SENSITIVE  Sensitive     CIPROFLOXACIN <=0.25 SENSITIVE Sensitive     GENTAMICIN <=1 SENSITIVE Sensitive     IMIPENEM <=0.25 SENSITIVE  Sensitive     NITROFURANTOIN 32 SENSITIVE Sensitive     TRIMETH/SULFA <=20 SENSITIVE Sensitive     AMPICILLIN/SULBACTAM 8 SENSITIVE Sensitive     PIP/TAZO 8 SENSITIVE Sensitive     Extended ESBL NEGATIVE Sensitive     * >=100,000 COLONIES/mL KLEBSIELLA PNEUMONIAE  SARS Coronavirus 2 (CEPHEID - Performed in [place] hospital lab), Hosp Order     Status: None   Collection Time: 01/23/19  5:44 PM   Specimen: Nasopharyngeal Swab  Result Value Ref Range Status   SARS Coronavirus 2 NEGATIVE NEGATIVE Final    Comment: (NOTE) If result is NEGATIVE SARS-CoV-2 target nucleic acids are NOT DETECTED. The SARS-CoV-2 RNA is generally detectable in upper and lower  respiratory specimens during the acute phase of infection. The lowest  concentration of SARS-CoV-2 viral copies this assay can detect is 250  copies / mL. A negative result does not preclude SARS-CoV-2 infection  and should not be used as the sole basis for treatment or other  patient management decisions.  A negative result may occur with  improper specimen collection / handling, submission of specimen other  than nasopharyngeal swab, presence of viral mutation(s) within the  areas targeted by this assay, and inadequate number of viral copies  (<250 copies / mL). A negative result must be combined with clinical  observations, patient history, and epidemiological information. If result is POSITIVE SARS-CoV-2 target nucleic acids are DETECTED. The SARS-CoV-2 RNA is generally detectable in upper and lower  respiratory specimens dur ing the acute phase of infection.  Positive  results are indicative of active infection with SARS-CoV-2.  Clinical  correlation with patient history and other diagnostic information is  necessary to determine patient infection status.  Positive results do  not rule out bacterial infection or co-infection with other viruses. If result is PRESUMPTIVE POSTIVE SARS-CoV-2 nucleic acids MAY BE PRESENT.   A  presumptive positive result was obtained on the submitted specimen  and confirmed on repeat testing.  While 2019 novel coronavirus  (SARS-CoV-2) nucleic acids may be present in the submitted sample  additional confirmatory testing may be necessary for epidemiological  and / or clinical management purposes  to differentiate between  SARS-CoV-2 and other Sarbecovirus currently known to infect humans.  If clinically indicated additional testing with an alternate test  methodology [phone]) is advised. The SARS-CoV-2 RNA is generally  detectable in upper and lower respiratory sp ecimens during the acute  phase of infection. The expected result is Negative. Fact Sheet for Patients:  StrictlyIdeas.no Fact Sheet for Healthcare Providers: BankingDealers.co.za This test is not yet approved or cleared by the [place] FDA and has been authorized for detection and/or diagnosis of SARS-CoV-2 by FDA under an Emergency Use Authorization (EUA).  This EUA will remain in effect (meaning this test can be used) for the duration of the COVID-19 declaration under Section 564(b)(1) of the Act, 21 U.S.C. section 360bbb-3(b)(1), unless the authorization is terminated or revoked sooner. Performed at [facility], [address], [place], [place] [address]      Labs: BNP (last 3 results) No results for input(s): BNP in the last 8760 hours. Basic Metabolic Panel: Recent Labs  Lab 01/23/19 1603 01/25/19 0705  NA 140 142  K 3.8 4.0  CL 105 107  CO2 24 26  GLUCOSE 104* 104*  BUN 18 15  CREATININE 1.20* 0.93  CALCIUM 9.2 9.8  MG  --  1.9   Liver Function Tests: Recent Labs  Lab 01/23/19 1603  AST 19  ALT 21  ALKPHOS 61  BILITOT 1.0  PROT 6.0*  ALBUMIN 3.6   No results for input(s): LIPASE, AMYLASE in the last 168 hours. No results for input(s): AMMONIA in the last 168 hours. CBC: Recent Labs  Lab 01/23/19 1603 01/25/19 0705  WBC 4.6 6.7   NEUTROABS 2.9  --   HGB 11.7* 13.7  HCT 36.1 41.8  MCV 91.2 90.5  PLT 235 255   Cardiac Enzymes: Recent Labs  Lab 01/23/19 1603 01/23/19 2027 01/24/19 0233  TROPONINI <0.03 <0.03 <0.03   BNP: Invalid input(s): POCBNP CBG: No results for input(s): GLUCAP in the last 168 hours. D-Dimer No results for input(s): DDIMER in the last 72 hours. Hgb A1c Recent Labs    01/24/19 1309  HGBA1C 5.6   Lipid Profile Recent Labs    01/25/19 0705  CHOL 145  HDL 49  LDLCALC 74  TRIG 110  CHOLHDL 3.0   Thyroid function studies Recent Labs    01/23/19 2027  TSH 1.057   Anemia work up No results for input(s): VITAMINB12, FOLATE, FERRITIN, TIBC, IRON, RETICCTPCT in the last 72 hours. Urinalysis    Component Value Date/Time   COLORURINE AMBER (A) 01/23/2019 1617   APPEARANCEUR CLOUDY (A) 01/23/2019 1617   LABSPEC 1.018 01/23/2019 1617   PHURINE 5.0 01/23/2019 1617   GLUCOSEU NEGATIVE 01/23/2019 1617   HGBUR NEGATIVE 01/23/2019 1617   BILIRUBINUR NEGATIVE 01/23/2019 1617   KETONESUR NEGATIVE 01/23/2019 1617   PROTEINUR NEGATIVE 01/23/2019 1617   NITRITE POSITIVE (A) 01/23/2019 1617   LEUKOCYTESUR LARGE (A) 01/23/2019 1617   Sepsis Labs Invalid input(s): PROCALCITONIN,  WBC,  LACTICIDVEN Microbiology Recent Results (from the past 240 hour(s))  Urine culture     Status: Abnormal   Collection Time: 01/23/19  4:17 PM   Specimen: Urine, Clean Catch  Result Value Ref Range Status   Specimen Description   Final    URINE, CLEAN CATCH Performed at [facility], [address], [place], [place] [address]    Special Requests   Final    NONE Performed at [facility], [address] [place], [place] [address]    Culture >=100,000 COLONIES/mL KLEBSIELLA PNEUMONIAE (A)  Final   Report Status 01/26/2019 FINAL  Final   Organism ID, Bacteria KLEBSIELLA PNEUMONIAE (A)  Final      Susceptibility   Klebsiella pneumoniae - MIC*    AMPICILLIN >=32 RESISTANT Resistant     CEFAZOLIN  <=4 SENSITIVE Sensitive     CEFTRIAXONE <=1 SENSITIVE Sensitive     CIPROFLOXACIN <=0.25 SENSITIVE Sensitive     GENTAMICIN <=1 SENSITIVE Sensitive     IMIPENEM <=0.25 SENSITIVE Sensitive     NITROFURANTOIN 32 SENSITIVE Sensitive     TRIMETH/SULFA <=20 SENSITIVE Sensitive     AMPICILLIN/SULBACTAM 8 SENSITIVE Sensitive     PIP/TAZO 8 SENSITIVE Sensitive     Extended ESBL NEGATIVE Sensitive     * >=100,000 COLONIES/mL KLEBSIELLA PNEUMONIAE  SARS Coronavirus 2 (CEPHEID - Performed in [place] hospital lab), Hosp Order     Status: None   Collection Time: 01/23/19  5:44 PM   Specimen: Nasopharyngeal Swab  Result Value Ref Range Status   SARS Coronavirus 2 NEGATIVE NEGATIVE Final    Comment: (NOTE) If result is NEGATIVE SARS-CoV-2 target nucleic acids are NOT DETECTED. The SARS-CoV-2 RNA is generally detectable  in upper and lower  respiratory specimens during the acute phase of infection. The lowest  concentration of SARS-CoV-2 viral copies this assay can detect is 250  copies / mL. A negative result does not preclude SARS-CoV-2 infection  and should not be used as the sole basis for treatment or other  patient management decisions.  A negative result may occur with  improper specimen collection / handling, submission of specimen other  than nasopharyngeal swab, presence of viral mutation(s) within the  areas targeted by this assay, and inadequate number of viral copies  (<250 copies / mL). A negative result must be combined with clinical  observations, patient history, and epidemiological information. If result is POSITIVE SARS-CoV-2 target nucleic acids are DETECTED. The SARS-CoV-2 RNA is generally detectable in upper and lower  respiratory specimens dur ing the acute phase of infection.  Positive  results are indicative of active infection with SARS-CoV-2.  Clinical  correlation with patient history and other diagnostic information is  necessary to determine patient infection  status.  Positive results do  not rule out bacterial infection or co-infection with other viruses. If result is PRESUMPTIVE POSTIVE SARS-CoV-2 nucleic acids MAY BE PRESENT.   A presumptive positive result was obtained on the submitted specimen  and confirmed on repeat testing.  While 2019 novel coronavirus  (SARS-CoV-2) nucleic acids may be present in the submitted sample  additional confirmatory testing may be necessary for epidemiological  and / or clinical management purposes  to differentiate between  SARS-CoV-2 and other Sarbecovirus currently known to infect humans.  If clinically indicated additional testing with an alternate test  methodology [phone]) is advised. The SARS-CoV-2 RNA is generally  detectable in upper and lower respiratory sp ecimens during the acute  phase of infection. The expected result is Negative. Fact Sheet for Patients:  StrictlyIdeas.no Fact Sheet for Healthcare Providers: BankingDealers.co.za This test is not yet approved or cleared by the [place] FDA and has been authorized for detection and/or diagnosis of SARS-CoV-2 by FDA under an Emergency Use Authorization (EUA).  This EUA will remain in effect (meaning this test can be used) for the duration of the COVID-19 declaration under Section 564(b)(1) of the Act, 21 U.S.C. section 360bbb-3(b)(1), unless the authorization is terminated or revoked sooner. Performed at [facility], [address] [place], [place] [address]    Time coordinating discharge: 33 minutes   SIGNED:  Irwin Brakeman, MD  [facility] 01/26/2019, 1:50 PM How to contact the [facility] Attending or Consulting provider [place] or covering provider during after hours [place], for this patient?  1. Check the care team in [facility] and look for a) attending/consulting [facility] provider listed and b) the [facility] team listed 2. Log into [email or web address] and use [place]'s universal password to  access. If you do not have the password, please contact the hospital operator. 3. Locate the [facility] provider you are looking for under [facility] and page to a number that you can be directly reached. 4. If you still have difficulty reaching the provider, please page the [facility] (Director on Call) for the Hospitalists listed on amion for assistance.
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
## 2019-01-26 NOTE — Plan of Care (Signed)

## 2019-01-26 NOTE — Plan of Care (Signed)
  Problem: Education: Goal: Knowledge of General Education information will improve Description: Including pain rating scale, medication(s)/side effects and non-pharmacologic comfort measures 01/26/2019 1503 by Rance Muir, RN Outcome: Adequate for Discharge 01/26/2019 1101 by Rance Muir, RN Outcome: Progressing   Problem: Health Behavior/Discharge Planning: Goal: Ability to manage health-related needs will improve 01/26/2019 1503 by Rance Muir, RN Outcome: Adequate for Discharge 01/26/2019 1101 by Rance Muir, RN Outcome: Progressing   Problem: Clinical Measurements: Goal: Ability to maintain clinical measurements within normal limits will improve 01/26/2019 1503 by Rance Muir, RN Outcome: Adequate for Discharge 01/26/2019 1101 by Rance Muir, RN Outcome: Progressing Goal: Will remain free from infection 01/26/2019 1503 by Rance Muir, RN Outcome: Adequate for Discharge 01/26/2019 1101 by Rance Muir, RN Outcome: Progressing Goal: Diagnostic test results will improve 01/26/2019 1503 by Rance Muir, RN Outcome: Adequate for Discharge 01/26/2019 1101 by Rance Muir, RN Outcome: Progressing Goal: Respiratory complications will improve 01/26/2019 1503 by Rance Muir, RN Outcome: Adequate for Discharge 01/26/2019 1101 by Rance Muir, RN Outcome: Progressing Goal: Cardiovascular complication will be avoided 01/26/2019 1503 by Rance Muir, RN Outcome: Adequate for Discharge 01/26/2019 1101 by Rance Muir, RN Outcome: Progressing   Problem: Activity: Goal: Risk for activity intolerance will decrease 01/26/2019 1503 by Rance Muir, RN Outcome: Adequate for Discharge 01/26/2019 1101 by Rance Muir, RN Outcome: Progressing   Problem: Nutrition: Goal: Adequate nutrition will be maintained 01/26/2019 1503 by Rance Muir, RN Outcome: Adequate for Discharge 01/26/2019 1101 by Rance Muir, RN Outcome: Progressing   Problem: Coping: Goal: Level of anxiety will decrease 01/26/2019 1503 by Rance Muir, RN Outcome: Adequate for  Discharge 01/26/2019 1101 by Rance Muir, RN Outcome: Progressing   Problem: Elimination: Goal: Will not experience complications related to bowel motility 01/26/2019 1503 by Rance Muir, RN Outcome: Adequate for Discharge 01/26/2019 1101 by Rance Muir, RN Outcome: Progressing Goal: Will not experience complications related to urinary retention 01/26/2019 1503 by Rance Muir, RN Outcome: Adequate for Discharge 01/26/2019 1101 by Rance Muir, RN Outcome: Progressing   Problem: Pain Managment: Goal: General experience of comfort will improve 01/26/2019 1503 by Rance Muir, RN Outcome: Adequate for Discharge 01/26/2019 1101 by Rance Muir, RN Outcome: Progressing   Problem: Safety: Goal: Ability to remain free from injury will improve 01/26/2019 1503 by Rance Muir, RN Outcome: Adequate for Discharge 01/26/2019 1101 by Rance Muir, RN Outcome: Progressing   Problem: Skin Integrity: Goal: Risk for impaired skin integrity will decrease 01/26/2019 1503 by Rance Muir, RN Outcome: Adequate for Discharge 01/26/2019 1101 by Rance Muir, RN Outcome: Progressing

## 2019-01-30 DIAGNOSIS — R410 Disorientation, unspecified: Secondary | ICD-10-CM | POA: Diagnosis not present

## 2019-01-30 DIAGNOSIS — N390 Urinary tract infection, site not specified: Secondary | ICD-10-CM | POA: Diagnosis not present

## 2019-02-02 ENCOUNTER — Other Ambulatory Visit: Payer: Self-pay

## 2019-02-02 NOTE — Patient Outreach (Signed)
[facility]) Care Management  02/02/2019  Brittney Williams 1945/10/04 [id number]   EMMI- General Discharge RED ON EMMI ALERT Day # 4 Date: 01/31/2019 Red Alert Reason:  Unfilled prescriptions? Yes  Sad/hopeless/anxious/empty? Yes     Outreach attempt: spoke with patient.  She is able to verify HIPAA. She states that she has all her medications.  Patient states she has problems with depression and takes Prozac.  PHQ-9 = 1.  She denies any thoughts of harming herself.  Patient denies any problems.     Plan: RN CM will close case.    Jone Baseman, RN, MSN [facility] Care Management Care Management Coordinator Direct Line [phone] Toll Free: [phone]  Fax: [phone]

## 2019-02-06 ENCOUNTER — Telehealth: Payer: Self-pay

## 2019-02-06 NOTE — Telephone Encounter (Signed)
Received fax from [facility] that patient has monitor but has not activated it yet.Attempt to reach patient, line just rings. Will mail letter

## 2019-02-11 ENCOUNTER — Other Ambulatory Visit (HOSPITAL_COMMUNITY): Payer: Medicare Other

## 2019-02-11 ENCOUNTER — Encounter (HOSPITAL_COMMUNITY): Payer: Medicare Other
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
## 2019-02-27 ENCOUNTER — Ambulatory Visit: Payer: Medicare Other | Admitting: Cardiovascular Disease

## 2019-03-20 ENCOUNTER — Encounter (HOSPITAL_COMMUNITY): Payer: Medicare Other

## 2019-03-20 ENCOUNTER — Ambulatory Visit (HOSPITAL_COMMUNITY): Payer: Medicare Other

## 2019-03-25 ENCOUNTER — Other Ambulatory Visit: Payer: Self-pay

## 2019-03-25 ENCOUNTER — Encounter (HOSPITAL_COMMUNITY): Payer: Medicare Other

## 2019-03-25 ENCOUNTER — Ambulatory Visit (HOSPITAL_COMMUNITY): Admission: RE | Admit: 2019-03-25 | Payer: Medicare Other | Source: Ambulatory Visit

## 2019-03-25 ENCOUNTER — Ambulatory Visit (HOSPITAL_COMMUNITY)
Admission: RE | Admit: 2019-03-25 | Discharge: 2019-03-25 | Disposition: A | Discharge status: Home / Self Care | Payer: Medicare Other | Source: Ambulatory Visit | Attending: Cardiovascular Disease | Admitting: Cardiovascular Disease

## 2019-03-25 ENCOUNTER — Telehealth: Payer: Self-pay | Admitting: Physician Assistant

## 2019-03-25 ENCOUNTER — Encounter (HOSPITAL_COMMUNITY)
Admission: RE | Admit: 2019-03-25 | Discharge: 2019-03-25 | Disposition: A | Discharge status: Home / Self Care | Payer: Medicare Other | Source: Ambulatory Visit | Attending: Cardiovascular Disease | Admitting: Cardiovascular Disease

## 2019-03-25 DIAGNOSIS — R079 Chest pain, unspecified: Secondary | ICD-10-CM | POA: Insufficient documentation

## 2019-03-25 LAB — NM MYOCAR MULTI W/SPECT W/WALL MOTION / EF
LV dias vol: 67 mL (ref 46–106)
LV sys vol: 34 mL
Peak HR: 91 {beats}/min
RATE: 0.35
Rest HR: 71 {beats}/min
SDS: 0
SRS: 5
SSS: 5
TID: 1.33

## 2019-03-25 MED ORDER — TECHNETIUM TC 99M TETROFOSMIN IV KIT
30.0000 | PACK | Freq: Once | INTRAVENOUS | Status: AC | PRN
  Administered 2019-03-25: 10:00:00 27 via INTRAVENOUS

## 2019-03-25 MED ORDER — SODIUM CHLORIDE 0.9% FLUSH
INTRAVENOUS | Status: AC
  Administered 2019-03-25: 10:00:00 10 mL via INTRAVENOUS
  Filled 2019-03-25: qty 10

## 2019-03-25 MED ORDER — REGADENOSON 0.4 MG/5ML IV SOLN
INTRAVENOUS | Status: AC
  Administered 2019-03-25: 10:00:00 0.08 mg via INTRAVENOUS
  Filled 2019-03-25: qty 5

## 2019-03-25 MED ORDER — TECHNETIUM TC 99M TETROFOSMIN IV KIT
10.0000 | PACK | Freq: Once | INTRAVENOUS | Status: AC | PRN
  Administered 2019-03-25: 09:00:00 9.32 via INTRAVENOUS

## 2019-03-25 NOTE — Telephone Encounter (Signed)
Please call patient regarding monitor / tg

## 2019-03-25 NOTE — Telephone Encounter (Signed)
Spoke with pt regarding event monitor. States that she did receive monitor and did not want to wear it so she returned it. Pt will speak to provider at next office visit to discuss the need to wear monitor.

## 2019-03-30 DIAGNOSIS — Z720 Tobacco use: Secondary | ICD-10-CM | POA: Insufficient documentation

## 2019-03-30 DIAGNOSIS — I420 Dilated cardiomyopathy: Secondary | ICD-10-CM | POA: Insufficient documentation

## 2019-03-30 DIAGNOSIS — Z87898 Personal history of other specified conditions: Secondary | ICD-10-CM | POA: Insufficient documentation
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
## 2019-04-03 ENCOUNTER — Other Ambulatory Visit: Payer: Self-pay

## 2019-04-03 NOTE — Patient Outreach (Signed)
[facility]) Care Management  04/03/2019  [name] [birth year] [id number]   Medication Adherence call to Mrs. Brittney Williams  Patient did not answer patient is past due on Atorvastatin 40 mg under [facility].   [facility] Management Direct Dial [phone]  Fax [phone] [name]

## 2019-04-06 ENCOUNTER — Encounter: Payer: Self-pay | Admitting: Physician Assistant

## 2019-04-06 ENCOUNTER — Other Ambulatory Visit: Payer: Self-pay

## 2019-04-06 ENCOUNTER — Telehealth (INDEPENDENT_AMBULATORY_CARE_PROVIDER_SITE_OTHER): Payer: Medicare Other | Admitting: Physician Assistant

## 2019-04-06 VITALS — BP 160/80 | HR 64 | Ht 63.0 in | Wt 149.0 lb

## 2019-04-06 DIAGNOSIS — I420 Dilated cardiomyopathy: Secondary | ICD-10-CM | POA: Diagnosis not present

## 2019-04-06 DIAGNOSIS — R55 Syncope and collapse: Secondary | ICD-10-CM | POA: Diagnosis not present

## 2019-04-06 DIAGNOSIS — I10 Essential (primary) hypertension: Secondary | ICD-10-CM | POA: Diagnosis not present

## 2019-04-06 DIAGNOSIS — Z87898 Personal history of other specified conditions: Secondary | ICD-10-CM

## 2019-04-06 DIAGNOSIS — E782 Mixed hyperlipidemia: Secondary | ICD-10-CM

## 2019-04-06 DIAGNOSIS — Z720 Tobacco use: Secondary | ICD-10-CM

## 2019-04-06 MED ORDER — CARVEDILOL 12.5 MG PO TABS: 12.5000 mg | ORAL_TABLET | Freq: Two times a day (BID) | ORAL | 3 refills | Status: DC

## 2019-04-06 NOTE — Patient Instructions (Signed)
Medication Instructions:  Your physician has recommended you make the following change in your medication:  Increase Coreg to 12.5 my Two Times Daily   If you need a refill on your cardiac medications before your next appointment, please call your pharmacy.   Lab work: NONE  If you have labs (blood work) drawn today and your tests are completely normal, you will receive your results only by: Marland Kitchen MyChart Message (if you have MyChart) OR . A paper copy in the mail If you have any lab test that is abnormal or we need to change your treatment, we will call you to review the results.  Testing/Procedures: Your physician has recommended that you wear an event monitor. Event monitors are medical devices that record the heart's electrical activity. Doctors most often [place] these monitors to diagnose arrhythmias. Arrhythmias are problems with the speed or rhythm of the heartbeat. The monitor is a small, portable device. You can wear one while you do your normal daily activities. This is usually used to diagnose what is causing palpitations/syncope (passing out).   Follow-Up: At [facility], you and your health needs are our priority.  As part of our continuing mission to provide you with exceptional heart care, we have created designated Provider Care Teams.  These Care Teams include your primary Cardiologist (physician) and Advanced Practice Providers (APPs -  Physician Assistants and Nurse Practitioners) who all work together to provide you with the care you need, when you need it. You will need a follow up appointment in 4 weeks.  Please call our office 2 months in advance to schedule this appointment.  You may see Jenkins Rouge, MD or one of the following Advanced Practice Providers on your designated Care Team:   Bernerd Pho, PA-C [facility]) . Ermalinda Barrios, PA-C ([place])  Any Other Special Instructions Will Be Listed Below (If Applicable). Thank you for choosing [facility]!
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
## 2019-05-04 ENCOUNTER — Ambulatory Visit: Payer: Medicare Other | Admitting: Physician Assistant

## 2019-06-04 DIAGNOSIS — Z23 Encounter for immunization: Secondary | ICD-10-CM | POA: Diagnosis not present

## 2019-06-04 DIAGNOSIS — E7849 Other hyperlipidemia: Secondary | ICD-10-CM | POA: Diagnosis not present

## 2019-06-04 DIAGNOSIS — I10 Essential (primary) hypertension: Secondary | ICD-10-CM | POA: Diagnosis not present

## 2019-07-10 ENCOUNTER — Other Ambulatory Visit: Payer: Self-pay

## 2019-07-10 ENCOUNTER — Emergency Department (HOSPITAL_COMMUNITY): Payer: Medicare Other

## 2019-07-10 ENCOUNTER — Encounter (HOSPITAL_COMMUNITY): Payer: Self-pay | Admitting: Emergency Medicine

## 2019-07-10 ENCOUNTER — Emergency Department (HOSPITAL_COMMUNITY)
Admission: EM | Admit: 2019-07-10 | Discharge: 2019-07-10 | Disposition: A | Discharge status: Home / Self Care | Payer: Medicare Other | Attending: Emergency Medicine | Admitting: Emergency Medicine

## 2019-07-10 DIAGNOSIS — F17210 Nicotine dependence, cigarettes, uncomplicated: Secondary | ICD-10-CM | POA: Insufficient documentation

## 2019-07-10 DIAGNOSIS — R197 Diarrhea, unspecified: Secondary | ICD-10-CM | POA: Diagnosis not present

## 2019-07-10 DIAGNOSIS — W19XXXA Unspecified fall, initial encounter: Secondary | ICD-10-CM | POA: Diagnosis not present

## 2019-07-10 DIAGNOSIS — I110 Hypertensive heart disease with heart failure: Secondary | ICD-10-CM | POA: Insufficient documentation

## 2019-07-10 DIAGNOSIS — R0602 Shortness of breath: Secondary | ICD-10-CM | POA: Diagnosis not present

## 2019-07-10 DIAGNOSIS — Z79899 Other long term (current) drug therapy: Secondary | ICD-10-CM | POA: Insufficient documentation

## 2019-07-10 DIAGNOSIS — I5022 Chronic systolic (congestive) heart failure: Secondary | ICD-10-CM | POA: Insufficient documentation

## 2019-07-10 DIAGNOSIS — I10 Essential (primary) hypertension: Secondary | ICD-10-CM | POA: Diagnosis not present

## 2019-07-10 LAB — CBC WITH DIFFERENTIAL/PLATELET
Abs Immature Granulocytes: 0.04 10*3/uL (ref 0.00–0.07)
Basophils Absolute: 0 10*3/uL (ref 0.0–0.1)
Basophils Relative: 0 %
Eosinophils Absolute: 0 10*3/uL (ref 0.0–0.5)
Eosinophils Relative: 0 %
HCT: 47.3 % — ABNORMAL HIGH (ref 36.0–46.0)
Hemoglobin: 14.9 g/dL (ref 12.0–15.0)
Immature Granulocytes: 0 %
Lymphocytes Relative: 8 %
Lymphs Abs: 1.1 10*3/uL (ref 0.7–4.0)
MCH: 28.9 pg (ref 26.0–34.0)
MCHC: 31.5 g/dL (ref 30.0–36.0)
MCV: 91.7 fL (ref 80.0–100.0)
Monocytes Absolute: 0.3 10*3/uL (ref 0.1–1.0)
Monocytes Relative: 2 %
Neutro Abs: 11.3 10*3/uL — ABNORMAL HIGH (ref 1.7–7.7)
Neutrophils Relative %: 90 %
Platelets: 328 10*3/uL (ref 150–400)
RBC: 5.16 MIL/uL — ABNORMAL HIGH (ref 3.87–5.11)
RDW: 14.2 % (ref 11.5–15.5)
WBC: 12.7 10*3/uL — ABNORMAL HIGH (ref 4.0–10.5)
nRBC: 0 % (ref 0.0–0.2)

## 2019-07-10 LAB — URINALYSIS, ROUTINE W REFLEX MICROSCOPIC
Bilirubin Urine: NEGATIVE
Glucose, UA: NEGATIVE mg/dL
Hgb urine dipstick: NEGATIVE
Ketones, ur: NEGATIVE mg/dL
Leukocytes,Ua: NEGATIVE
Nitrite: NEGATIVE
Protein, ur: 100 mg/dL — AB
Specific Gravity, Urine: 1.004 — ABNORMAL LOW (ref 1.005–1.030)
pH: 7 (ref 5.0–8.0)

## 2019-07-10 LAB — COMPREHENSIVE METABOLIC PANEL
ALT: 26 U/L (ref 0–44)
AST: 28 U/L (ref 15–41)
Albumin: 4.6 g/dL (ref 3.5–5.0)
Alkaline Phosphatase: 82 U/L (ref 38–126)
Anion gap: 12 (ref 5–15)
BUN: 10 mg/dL (ref 8–23)
CO2: 26 mmol/L (ref 22–32)
Calcium: 9.3 mg/dL (ref 8.9–10.3)
Chloride: 104 mmol/L (ref 98–111)
Creatinine, Ser: 0.84 mg/dL (ref 0.44–1.00)
GFR calc Af Amer: >60 mL/min (ref >60)
GFR calc non Af Amer: >60 mL/min (ref >60)
Glucose, Bld: 139 mg/dL — ABNORMAL HIGH (ref 70–99)
Potassium: 3.9 mmol/L (ref 3.5–5.1)
Sodium: 142 mmol/L (ref 135–145)
Total Bilirubin: 1.3 mg/dL — ABNORMAL HIGH (ref 0.3–1.2)
Total Protein: 7.9 g/dL (ref 6.5–8.1)

## 2019-07-10 MED ORDER — SODIUM CHLORIDE 0.9 % IV BOLUS
500.0000 mL | Freq: Once | INTRAVENOUS | Status: AC
  Administered 2019-07-10: 500 mL via INTRAVENOUS

## 2019-07-10 MED ORDER — LOPERAMIDE HCL 2 MG PO CAPS
4.0000 mg | ORAL_CAPSULE | ORAL | Status: DC | PRN
  Administered 2019-07-10: 4 mg via ORAL
  Filled 2019-07-10: qty 2

## 2019-07-10 NOTE — ED Notes (Signed)
Family called for discharge transportation. Daughter said ETA was 45 mins before transportation

## 2019-07-10 NOTE — Discharge Instructions (Addendum)
The testing today is reassuring.  It is unclear if your diarrhea is from food intolerance, or a viral infection.  To treat the diarrhea use Imodium y, as directed on the product instructions.  If that does not help, you can also use Kaopectate.  Our blood counts do not show any significant abnormalities requiring intervention or treatment at this time.  Your blood pressure was mildly elevated and should be rechecked next time you see your doctor.

## 2019-07-10 NOTE — ED Triage Notes (Signed)
Diarrhea, nausea, shortness of breath
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
## 2019-07-13 DIAGNOSIS — I10 Essential (primary) hypertension: Secondary | ICD-10-CM | POA: Diagnosis not present

## 2019-07-13 DIAGNOSIS — E7849 Other hyperlipidemia: Secondary | ICD-10-CM | POA: Diagnosis not present

## 2019-08-13 DIAGNOSIS — I5022 Chronic systolic (congestive) heart failure: Secondary | ICD-10-CM | POA: Diagnosis not present

## 2019-08-13 DIAGNOSIS — E7849 Other hyperlipidemia: Secondary | ICD-10-CM | POA: Diagnosis not present

## 2019-08-13 DIAGNOSIS — I110 Hypertensive heart disease with heart failure: Secondary | ICD-10-CM | POA: Diagnosis not present

## 2019-09-01 DIAGNOSIS — Z0001 Encounter for general adult medical examination with abnormal findings: Secondary | ICD-10-CM | POA: Diagnosis not present

## 2019-09-01 DIAGNOSIS — E7849 Other hyperlipidemia: Secondary | ICD-10-CM | POA: Diagnosis not present

## 2019-09-01 DIAGNOSIS — J329 Chronic sinusitis, unspecified: Secondary | ICD-10-CM | POA: Diagnosis not present

## 2019-09-01 DIAGNOSIS — F17200 Nicotine dependence, unspecified, uncomplicated: Secondary | ICD-10-CM | POA: Diagnosis not present

## 2019-09-01 DIAGNOSIS — E782 Mixed hyperlipidemia: Secondary | ICD-10-CM | POA: Diagnosis not present

## 2019-09-01 DIAGNOSIS — I5022 Chronic systolic (congestive) heart failure: Secondary | ICD-10-CM | POA: Diagnosis not present

## 2019-09-01 DIAGNOSIS — I10 Essential (primary) hypertension: Secondary | ICD-10-CM | POA: Diagnosis not present

## 2019-09-01 DIAGNOSIS — Z1389 Encounter for screening for other disorder: Secondary | ICD-10-CM | POA: Diagnosis not present

## 2019-10-11 DIAGNOSIS — I110 Hypertensive heart disease with heart failure: Secondary | ICD-10-CM | POA: Diagnosis not present

## 2019-10-11 DIAGNOSIS — E7849 Other hyperlipidemia: Secondary | ICD-10-CM | POA: Diagnosis not present

## 2019-10-11 DIAGNOSIS — I5022 Chronic systolic (congestive) heart failure: Secondary | ICD-10-CM | POA: Diagnosis not present

## 2020-01-21 ENCOUNTER — Other Ambulatory Visit: Payer: Self-pay | Admitting: Cardiovascular Disease

## 2020-01-21 DIAGNOSIS — R55 Syncope and collapse: Secondary | ICD-10-CM

## 2020-02-17 ENCOUNTER — Other Ambulatory Visit (HOSPITAL_COMMUNITY): Payer: Self-pay | Admitting: Physician Assistant

## 2020-02-17 DIAGNOSIS — Z1231 Encounter for screening mammogram for malignant neoplasm of breast: Secondary | ICD-10-CM

## 2020-02-29 ENCOUNTER — Ambulatory Visit (HOSPITAL_COMMUNITY): Payer: Medicare Other

## 2020-03-03 ENCOUNTER — Ambulatory Visit (HOSPITAL_COMMUNITY)
Admission: RE | Admit: 2020-03-03 | Discharge: 2020-03-03 | Disposition: A | Discharge status: Home / Self Care | Payer: Medicare Other | Source: Ambulatory Visit | Attending: Physician Assistant | Admitting: Physician Assistant

## 2020-03-03 ENCOUNTER — Encounter (HOSPITAL_COMMUNITY): Payer: Self-pay

## 2020-03-03 ENCOUNTER — Other Ambulatory Visit: Payer: Self-pay

## 2020-03-03 ENCOUNTER — Other Ambulatory Visit (HOSPITAL_COMMUNITY): Payer: Self-pay | Admitting: Physician Assistant

## 2020-03-03 DIAGNOSIS — N6489 Other specified disorders of breast: Secondary | ICD-10-CM

## 2020-03-03 DIAGNOSIS — Z1231 Encounter for screening mammogram for malignant neoplasm of breast: Secondary | ICD-10-CM | POA: Insufficient documentation

## 2020-03-08 ENCOUNTER — Ambulatory Visit (HOSPITAL_COMMUNITY): Admission: RE | Admit: 2020-03-08 | Payer: Medicare Other | Source: Ambulatory Visit

## 2020-03-08 ENCOUNTER — Encounter (HOSPITAL_COMMUNITY): Payer: Self-pay

## 2020-03-08 ENCOUNTER — Ambulatory Visit (HOSPITAL_COMMUNITY): Payer: Medicare Other

## 2020-03-08 ENCOUNTER — Inpatient Hospital Stay (HOSPITAL_COMMUNITY): Admission: RE | Admit: 2020-03-08 | Payer: Medicare Other | Source: Ambulatory Visit

## 2020-03-11 DIAGNOSIS — I110 Hypertensive heart disease with heart failure: Secondary | ICD-10-CM | POA: Diagnosis not present

## 2020-03-11 DIAGNOSIS — I5022 Chronic systolic (congestive) heart failure: Secondary | ICD-10-CM | POA: Diagnosis not present

## 2020-03-11 DIAGNOSIS — E7849 Other hyperlipidemia: Secondary | ICD-10-CM | POA: Diagnosis not present

## 2020-04-02 ENCOUNTER — Other Ambulatory Visit: Payer: Self-pay | Admitting: Cardiovascular Disease

## 2020-05-12 DIAGNOSIS — I110 Hypertensive heart disease with heart failure: Secondary | ICD-10-CM | POA: Diagnosis not present

## 2020-05-12 DIAGNOSIS — E7849 Other hyperlipidemia: Secondary | ICD-10-CM | POA: Diagnosis not present

## 2020-05-12 DIAGNOSIS — I5022 Chronic systolic (congestive) heart failure: Secondary | ICD-10-CM | POA: Diagnosis not present

## 2020-06-28 ENCOUNTER — Other Ambulatory Visit: Payer: Self-pay | Admitting: Cardiovascular Disease

## 2020-08-03 ENCOUNTER — Other Ambulatory Visit: Payer: Self-pay | Admitting: Cardiovascular Disease

## 2020-08-12 DIAGNOSIS — I5022 Chronic systolic (congestive) heart failure: Secondary | ICD-10-CM | POA: Diagnosis not present

## 2020-08-12 DIAGNOSIS — E7849 Other hyperlipidemia: Secondary | ICD-10-CM | POA: Diagnosis not present

## 2020-08-12 DIAGNOSIS — I110 Hypertensive heart disease with heart failure: Secondary | ICD-10-CM | POA: Diagnosis not present

## 2020-08-29 ENCOUNTER — Other Ambulatory Visit: Payer: Self-pay | Admitting: Cardiovascular Disease

## 2020-09-10 DIAGNOSIS — E7849 Other hyperlipidemia: Secondary | ICD-10-CM | POA: Diagnosis not present

## 2020-09-10 DIAGNOSIS — I5022 Chronic systolic (congestive) heart failure: Secondary | ICD-10-CM | POA: Diagnosis not present

## 2020-09-10 DIAGNOSIS — I110 Hypertensive heart disease with heart failure: Secondary | ICD-10-CM | POA: Diagnosis not present

## 2020-10-10 DIAGNOSIS — E7849 Other hyperlipidemia: Secondary | ICD-10-CM | POA: Diagnosis not present

## 2020-10-10 DIAGNOSIS — I5022 Chronic systolic (congestive) heart failure: Secondary | ICD-10-CM | POA: Diagnosis not present

## 2020-10-10 DIAGNOSIS — I110 Hypertensive heart disease with heart failure: Secondary | ICD-10-CM | POA: Diagnosis not present

## 2020-10-29 ENCOUNTER — Other Ambulatory Visit: Payer: Self-pay | Admitting: Cardiovascular Disease

## 2020-11-09 DIAGNOSIS — E7849 Other hyperlipidemia: Secondary | ICD-10-CM | POA: Diagnosis not present

## 2020-11-09 DIAGNOSIS — I110 Hypertensive heart disease with heart failure: Secondary | ICD-10-CM | POA: Diagnosis not present

## 2020-11-09 DIAGNOSIS — I5022 Chronic systolic (congestive) heart failure: Secondary | ICD-10-CM | POA: Diagnosis not present

## 2020-11-12 ENCOUNTER — Other Ambulatory Visit: Payer: Self-pay | Admitting: Cardiovascular Disease

## 2020-12-27 ENCOUNTER — Other Ambulatory Visit: Payer: Self-pay | Admitting: Cardiovascular Disease

## 2021-01-10 DIAGNOSIS — I5022 Chronic systolic (congestive) heart failure: Secondary | ICD-10-CM | POA: Diagnosis not present

## 2021-01-10 DIAGNOSIS — E7849 Other hyperlipidemia: Secondary | ICD-10-CM | POA: Diagnosis not present

## 2021-01-10 DIAGNOSIS — I110 Hypertensive heart disease with heart failure: Secondary | ICD-10-CM | POA: Diagnosis not present

## 2021-01-29 ENCOUNTER — Other Ambulatory Visit: Payer: Self-pay | Admitting: Cardiovascular Disease

## 2021-02-12 ENCOUNTER — Emergency Department (HOSPITAL_COMMUNITY): Payer: Medicare Other

## 2021-02-12 ENCOUNTER — Ambulatory Visit
Admission: EM | Admit: 2021-02-12 | Discharge: 2021-02-12 | Disposition: A | Discharge status: Home / Self Care | Payer: Medicare Other

## 2021-02-12 ENCOUNTER — Encounter: Payer: Self-pay | Admitting: Emergency Medicine

## 2021-02-12 ENCOUNTER — Emergency Department (HOSPITAL_COMMUNITY)
Admission: EM | Admit: 2021-02-12 | Discharge: 2021-02-12 | Disposition: A | Discharge status: Home / Self Care | Payer: Medicare Other | Attending: Emergency Medicine | Admitting: Emergency Medicine

## 2021-02-12 ENCOUNTER — Other Ambulatory Visit: Payer: Self-pay

## 2021-02-12 ENCOUNTER — Encounter (HOSPITAL_COMMUNITY): Payer: Self-pay

## 2021-02-12 DIAGNOSIS — R5383 Other fatigue: Secondary | ICD-10-CM | POA: Diagnosis not present

## 2021-02-12 DIAGNOSIS — Z20822 Contact with and (suspected) exposure to covid-19: Secondary | ICD-10-CM | POA: Insufficient documentation

## 2021-02-12 DIAGNOSIS — S81801A Unspecified open wound, right lower leg, initial encounter: Secondary | ICD-10-CM | POA: Diagnosis not present

## 2021-02-12 DIAGNOSIS — I5020 Unspecified systolic (congestive) heart failure: Secondary | ICD-10-CM | POA: Diagnosis not present

## 2021-02-12 DIAGNOSIS — Z79899 Other long term (current) drug therapy: Secondary | ICD-10-CM | POA: Insufficient documentation

## 2021-02-12 DIAGNOSIS — R001 Bradycardia, unspecified: Secondary | ICD-10-CM | POA: Diagnosis not present

## 2021-02-12 DIAGNOSIS — I959 Hypotension, unspecified: Secondary | ICD-10-CM | POA: Diagnosis not present

## 2021-02-12 DIAGNOSIS — Z87891 Personal history of nicotine dependence: Secondary | ICD-10-CM | POA: Diagnosis not present

## 2021-02-12 DIAGNOSIS — R197 Diarrhea, unspecified: Secondary | ICD-10-CM | POA: Insufficient documentation

## 2021-02-12 DIAGNOSIS — M79661 Pain in right lower leg: Secondary | ICD-10-CM | POA: Diagnosis not present

## 2021-02-12 DIAGNOSIS — R110 Nausea: Secondary | ICD-10-CM | POA: Insufficient documentation

## 2021-02-12 DIAGNOSIS — I110 Hypertensive heart disease with heart failure: Secondary | ICD-10-CM | POA: Diagnosis not present

## 2021-02-12 DIAGNOSIS — M791 Myalgia, unspecified site: Secondary | ICD-10-CM | POA: Diagnosis not present

## 2021-02-12 DIAGNOSIS — R519 Headache, unspecified: Secondary | ICD-10-CM | POA: Insufficient documentation

## 2021-02-12 DIAGNOSIS — X58XXXA Exposure to other specified factors, initial encounter: Secondary | ICD-10-CM | POA: Insufficient documentation

## 2021-02-12 DIAGNOSIS — M79604 Pain in right leg: Secondary | ICD-10-CM

## 2021-02-12 DIAGNOSIS — R059 Cough, unspecified: Secondary | ICD-10-CM | POA: Diagnosis not present

## 2021-02-12 LAB — CBC WITH DIFFERENTIAL/PLATELET
Abs Immature Granulocytes: 0.01 10*3/uL (ref 0.00–0.07)
Basophils Absolute: 0 10*3/uL (ref 0.0–0.1)
Basophils Relative: 0 %
Eosinophils Absolute: 0 10*3/uL (ref 0.0–0.5)
Eosinophils Relative: 1 %
HCT: 39.5 % (ref 36.0–46.0)
Hemoglobin: 13.2 g/dL (ref 12.0–15.0)
Immature Granulocytes: 0 %
Lymphocytes Relative: 35 %
Lymphs Abs: 1.9 10*3/uL (ref 0.7–4.0)
MCH: 30.9 pg (ref 26.0–34.0)
MCHC: 33.4 g/dL (ref 30.0–36.0)
MCV: 92.5 fL (ref 80.0–100.0)
Monocytes Absolute: 0.3 10*3/uL (ref 0.1–1.0)
Monocytes Relative: 6 %
Neutro Abs: 3.2 10*3/uL (ref 1.7–7.7)
Neutrophils Relative %: 58 %
Platelets: 227 10*3/uL (ref 150–400)
RBC: 4.27 MIL/uL (ref 3.87–5.11)
RDW: 14.5 % (ref 11.5–15.5)
WBC: 5.4 10*3/uL (ref 4.0–10.5)
nRBC: 0 % (ref 0.0–0.2)

## 2021-02-12 LAB — COMPREHENSIVE METABOLIC PANEL
ALT: 16 U/L (ref 0–44)
AST: 19 U/L (ref 15–41)
Albumin: 3.6 g/dL (ref 3.5–5.0)
Alkaline Phosphatase: 77 U/L (ref 38–126)
Anion gap: 9 (ref 5–15)
BUN: 12 mg/dL (ref 8–23)
CO2: 23 mmol/L (ref 22–32)
Calcium: 8.9 mg/dL (ref 8.9–10.3)
Chloride: 105 mmol/L (ref 98–111)
Creatinine, Ser: 0.84 mg/dL (ref 0.44–1.00)
GFR, Estimated: >60 mL/min (ref >60)
Glucose, Bld: 94 mg/dL (ref 70–99)
Potassium: 3.2 mmol/L — ABNORMAL LOW (ref 3.5–5.1)
Sodium: 137 mmol/L (ref 135–145)
Total Bilirubin: 1.1 mg/dL (ref 0.3–1.2)
Total Protein: 6.5 g/dL (ref 6.5–8.1)

## 2021-02-12 LAB — BRAIN NATRIURETIC PEPTIDE: B Natriuretic Peptide: 50 pg/mL (ref 0.0–100.0)

## 2021-02-12 LAB — RESP PANEL BY RT-PCR (FLU A&B, COVID) ARPGX2
Influenza A by PCR: NEGATIVE
Influenza B by PCR: NEGATIVE
SARS Coronavirus 2 by RT PCR: NEGATIVE

## 2021-02-12 LAB — TROPONIN I (HIGH SENSITIVITY)
Troponin I (High Sensitivity): 13 ng/L (ref <18)
Troponin I (High Sensitivity): 14 ng/L (ref <18)

## 2021-02-12 LAB — LIPASE, BLOOD: Lipase: 20 U/L (ref 11–51)

## 2021-02-12 LAB — LACTIC ACID, PLASMA: Lactic Acid, Venous: 1.1 mmol/L (ref 0.5–1.9)

## 2021-02-12 MED ORDER — DOXYCYCLINE HYCLATE 100 MG PO CAPS: 100.0000 mg | ORAL_CAPSULE | Freq: Two times a day (BID) | ORAL | 0 refills | Status: DC

## 2021-02-12 MED ORDER — ONDANSETRON HCL 4 MG PO TABS: 4.0000 mg | ORAL_TABLET | Freq: Three times a day (TID) | ORAL | 0 refills | Status: DC | PRN

## 2021-02-12 MED ORDER — ONDANSETRON HCL 4 MG/2ML IJ SOLN
4.0000 mg | Freq: Once | INTRAMUSCULAR | Status: AC
  Administered 2021-02-12: 4 mg via INTRAVENOUS
  Filled 2021-02-12: qty 2

## 2021-02-12 MED ORDER — DOXYCYCLINE HYCLATE 100 MG PO TABS
100.0000 mg | ORAL_TABLET | Freq: Once | ORAL | Status: AC
  Administered 2021-02-12: 100 mg via ORAL
  Filled 2021-02-12: qty 1

## 2021-02-12 MED ORDER — SODIUM CHLORIDE 0.9 % IV BOLUS
1000.0000 mL | Freq: Once | INTRAVENOUS | Status: AC
  Administered 2021-02-12: 1000 mL via INTRAVENOUS

## 2021-02-12 NOTE — ED Triage Notes (Signed)
Wound on RT shin that will not heal and blisters popping up on LT leg.  Pt also reports feeling dizzy

## 2021-02-12 NOTE — Discharge Instructions (Addendum)
You were seen in the emergency department for a nonhealing wound to your right lower leg and continued nausea.  You had lab work and an x-ray that did not show any significant abnormalities.  We are putting you on some nausea medication and some antibiotics.  Please contact your primary care doctor for close follow-up.

## 2021-02-12 NOTE — ED Notes (Signed)
Pt in bed with eyes closed, pt arouses easily to verbal stim, pt awaits md eval

## 2021-02-12 NOTE — ED Triage Notes (Signed)
Pt to er, daughter with pt, states that pt is here for some blisters on her legs and concern for infection, states that she has had the blisters fairly recently, states that they went to urgent care and was sent here because she had some low blood pressure.  States that pt also has some nausea, diarrhea and dizziness.  Pt denies chest pain.

## 2021-02-12 NOTE — ED Notes (Signed)
Patient is being discharged from the Urgent Care and sent to the Emergency Department via pov . Per Molli Barrows, FNP  patient is in need of higher level of care due to hypotension . Patient is aware and verbalizes understanding of plan of care.  Vitals:   02/12/21 0953  BP: (!) 95/53  Pulse: 61  Resp: 18  Temp: 98.7 F (37.1 C)  SpO2: 100%
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
## 2021-02-12 NOTE — ED Provider Notes (Signed)
Patient was not assessed by provider due to  overall symptoms and hypotension, edema, and Heart Failure, family was advised to transport to [place], [place], [place] 02/12/21 1100

## 2021-02-16 ENCOUNTER — Other Ambulatory Visit: Payer: Self-pay | Admitting: Cardiovascular Disease

## 2021-02-17 LAB — CULTURE, BLOOD (ROUTINE X 2)
Culture: NO GROWTH
Culture: NO GROWTH

## 2021-04-05 ENCOUNTER — Other Ambulatory Visit: Payer: Self-pay | Admitting: Physician Assistant

## 2021-04-12 DIAGNOSIS — I5022 Chronic systolic (congestive) heart failure: Secondary | ICD-10-CM | POA: Diagnosis not present

## 2021-04-12 DIAGNOSIS — E7849 Other hyperlipidemia: Secondary | ICD-10-CM | POA: Diagnosis not present

## 2021-04-12 DIAGNOSIS — I110 Hypertensive heart disease with heart failure: Secondary | ICD-10-CM | POA: Diagnosis not present

## 2021-06-09 ENCOUNTER — Other Ambulatory Visit: Payer: Self-pay | Admitting: Physician Assistant

## 2021-07-04 ENCOUNTER — Other Ambulatory Visit: Payer: Self-pay | Admitting: Cardiovascular Disease

## 2021-07-04 NOTE — Telephone Encounter (Signed)
This is a [place] pt.  °

## 2021-08-26 ENCOUNTER — Emergency Department (HOSPITAL_COMMUNITY): Payer: Medicare HMO

## 2021-08-26 ENCOUNTER — Other Ambulatory Visit: Payer: Self-pay

## 2021-08-26 ENCOUNTER — Observation Stay (HOSPITAL_COMMUNITY)
Admission: EM | Admit: 2021-08-26 | Discharge: 2021-08-28 | Disposition: A | Discharge status: Home / Self Care | Payer: Medicare HMO | Attending: Family Medicine | Admitting: Family Medicine

## 2021-08-26 ENCOUNTER — Encounter (HOSPITAL_COMMUNITY): Payer: Self-pay | Admitting: Emergency Medicine

## 2021-08-26 DIAGNOSIS — Z20822 Contact with and (suspected) exposure to covid-19: Secondary | ICD-10-CM | POA: Diagnosis not present

## 2021-08-26 DIAGNOSIS — I959 Hypotension, unspecified: Secondary | ICD-10-CM | POA: Diagnosis not present

## 2021-08-26 DIAGNOSIS — I951 Orthostatic hypotension: Secondary | ICD-10-CM | POA: Diagnosis not present

## 2021-08-26 DIAGNOSIS — I5022 Chronic systolic (congestive) heart failure: Secondary | ICD-10-CM | POA: Diagnosis present

## 2021-08-26 DIAGNOSIS — I5043 Acute on chronic combined systolic (congestive) and diastolic (congestive) heart failure: Secondary | ICD-10-CM | POA: Diagnosis not present

## 2021-08-26 DIAGNOSIS — R55 Syncope and collapse: Secondary | ICD-10-CM | POA: Diagnosis not present

## 2021-08-26 DIAGNOSIS — Z79899 Other long term (current) drug therapy: Secondary | ICD-10-CM | POA: Insufficient documentation

## 2021-08-26 DIAGNOSIS — E860 Dehydration: Secondary | ICD-10-CM | POA: Diagnosis not present

## 2021-08-26 DIAGNOSIS — E039 Hypothyroidism, unspecified: Secondary | ICD-10-CM | POA: Insufficient documentation

## 2021-08-26 DIAGNOSIS — I110 Hypertensive heart disease with heart failure: Secondary | ICD-10-CM | POA: Diagnosis not present

## 2021-08-26 DIAGNOSIS — Z8679 Personal history of other diseases of the circulatory system: Secondary | ICD-10-CM | POA: Diagnosis not present

## 2021-08-26 DIAGNOSIS — K219 Gastro-esophageal reflux disease without esophagitis: Secondary | ICD-10-CM | POA: Diagnosis present

## 2021-08-26 DIAGNOSIS — I10 Essential (primary) hypertension: Secondary | ICD-10-CM | POA: Diagnosis present

## 2021-08-26 DIAGNOSIS — J690 Pneumonitis due to inhalation of food and vomit: Secondary | ICD-10-CM | POA: Diagnosis not present

## 2021-08-26 DIAGNOSIS — R627 Adult failure to thrive: Secondary | ICD-10-CM

## 2021-08-26 DIAGNOSIS — N390 Urinary tract infection, site not specified: Secondary | ICD-10-CM | POA: Diagnosis present

## 2021-08-26 DIAGNOSIS — R531 Weakness: Secondary | ICD-10-CM | POA: Diagnosis not present

## 2021-08-26 LAB — URINALYSIS, MICROSCOPIC (REFLEX)

## 2021-08-26 LAB — CBC WITH DIFFERENTIAL/PLATELET
Abs Immature Granulocytes: 0.02 10*3/uL (ref 0.00–0.07)
Basophils Absolute: 0 10*3/uL (ref 0.0–0.1)
Basophils Relative: 0 %
Eosinophils Absolute: 0 10*3/uL (ref 0.0–0.5)
Eosinophils Relative: 0 %
HCT: 44.5 % (ref 36.0–46.0)
Hemoglobin: 14.1 g/dL (ref 12.0–15.0)
Immature Granulocytes: 0 %
Lymphocytes Relative: 23 %
Lymphs Abs: 1.2 10*3/uL (ref 0.7–4.0)
MCH: 29.3 pg (ref 26.0–34.0)
MCHC: 31.7 g/dL (ref 30.0–36.0)
MCV: 92.5 fL (ref 80.0–100.0)
Monocytes Absolute: 0.3 10*3/uL (ref 0.1–1.0)
Monocytes Relative: 5 %
Neutro Abs: 3.8 10*3/uL (ref 1.7–7.7)
Neutrophils Relative %: 72 %
Platelets: 255 10*3/uL (ref 150–400)
RBC: 4.81 MIL/uL (ref 3.87–5.11)
RDW: 14.6 % (ref 11.5–15.5)
WBC: 5.4 10*3/uL (ref 4.0–10.5)
nRBC: 0 % (ref 0.0–0.2)

## 2021-08-26 LAB — MAGNESIUM: Magnesium: 1.9 mg/dL (ref 1.7–2.4)

## 2021-08-26 LAB — URINALYSIS, ROUTINE W REFLEX MICROSCOPIC
Bilirubin Urine: NEGATIVE
Glucose, UA: NEGATIVE mg/dL
Ketones, ur: NEGATIVE mg/dL
Nitrite: NEGATIVE
Protein, ur: NEGATIVE mg/dL
Specific Gravity, Urine: 1.01 (ref 1.005–1.030)
pH: 7 (ref 5.0–8.0)

## 2021-08-26 LAB — COMPREHENSIVE METABOLIC PANEL
ALT: 8 U/L (ref 0–44)
AST: 16 U/L (ref 15–41)
Albumin: 3.4 g/dL — ABNORMAL LOW (ref 3.5–5.0)
Alkaline Phosphatase: 80 U/L (ref 38–126)
Anion gap: 12 (ref 5–15)
BUN: 13 mg/dL (ref 8–23)
CO2: 22 mmol/L (ref 22–32)
Calcium: 9 mg/dL (ref 8.9–10.3)
Chloride: 106 mmol/L (ref 98–111)
Creatinine, Ser: 1.15 mg/dL — ABNORMAL HIGH (ref 0.44–1.00)
GFR, Estimated: 50 mL/min — ABNORMAL LOW (ref >60)
Glucose, Bld: 161 mg/dL — ABNORMAL HIGH (ref 70–99)
Potassium: 3.5 mmol/L (ref 3.5–5.1)
Sodium: 140 mmol/L (ref 135–145)
Total Bilirubin: 1.1 mg/dL (ref 0.3–1.2)
Total Protein: 6.6 g/dL (ref 6.5–8.1)

## 2021-08-26 LAB — CK: Total CK: 28 U/L — ABNORMAL LOW (ref 38–234)

## 2021-08-26 LAB — TROPONIN I (HIGH SENSITIVITY): Troponin I (High Sensitivity): 14 ng/L (ref <18)

## 2021-08-26 LAB — LACTIC ACID, PLASMA: Lactic Acid, Venous: 2.7 mmol/L (ref 0.5–1.9)

## 2021-08-26 LAB — RESP PANEL BY RT-PCR (FLU A&B, COVID) ARPGX2
Influenza A by PCR: NEGATIVE
Influenza B by PCR: NEGATIVE
SARS Coronavirus 2 by RT PCR: NEGATIVE

## 2021-08-26 MED ORDER — SODIUM CHLORIDE 0.9 % IV BOLUS
1000.0000 mL | Freq: Once | INTRAVENOUS | Status: AC
  Administered 2021-08-26: 1000 mL via INTRAVENOUS

## 2021-08-26 MED ORDER — ENOXAPARIN SODIUM 40 MG/0.4ML IJ SOSY
40.0000 mg | PREFILLED_SYRINGE | INTRAMUSCULAR | Status: DC
  Administered 2021-08-27 – 2021-08-28 (×2): 40 mg via SUBCUTANEOUS
  Filled 2021-08-26 (×2): qty 0.4

## 2021-08-26 MED ORDER — OXYBUTYNIN CHLORIDE 5 MG PO TABS
5.0000 mg | ORAL_TABLET | Freq: Two times a day (BID) | ORAL | Status: DC
  Administered 2021-08-26 – 2021-08-28 (×4): 5 mg via ORAL
  Filled 2021-08-26 (×4): qty 1

## 2021-08-26 MED ORDER — LACTATED RINGERS IV SOLN: INTRAVENOUS | Status: DC

## 2021-08-26 MED ORDER — ONDANSETRON HCL 4 MG/2ML IJ SOLN: 4.0000 mg | Freq: Four times a day (QID) | INTRAMUSCULAR | Status: DC | PRN

## 2021-08-26 MED ORDER — LEVOTHYROXINE SODIUM 25 MCG PO TABS
25.0000 ug | ORAL_TABLET | Freq: Every day | ORAL | Status: DC
  Administered 2021-08-27 – 2021-08-28 (×2): 25 ug via ORAL
  Filled 2021-08-26 (×2): qty 1

## 2021-08-26 MED ORDER — ONDANSETRON HCL 4 MG PO TABS: 4.0000 mg | ORAL_TABLET | Freq: Four times a day (QID) | ORAL | Status: DC | PRN

## 2021-08-26 MED ORDER — DONEPEZIL HCL 5 MG PO TABS
10.0000 mg | ORAL_TABLET | Freq: Every evening | ORAL | Status: DC
  Administered 2021-08-26 – 2021-08-28 (×3): 10 mg via ORAL
  Filled 2021-08-26 (×3): qty 2

## 2021-08-26 MED ORDER — LISINOPRIL 10 MG PO TABS
40.0000 mg | ORAL_TABLET | Freq: Every day | ORAL | Status: DC
  Administered 2021-08-27 – 2021-08-28 (×2): 40 mg via ORAL
  Filled 2021-08-26 (×2): qty 4

## 2021-08-26 MED ORDER — CARVEDILOL 12.5 MG PO TABS
12.5000 mg | ORAL_TABLET | Freq: Two times a day (BID) | ORAL | Status: DC
  Administered 2021-08-27 – 2021-08-28 (×4): 12.5 mg via ORAL
  Filled 2021-08-26 (×4): qty 1

## 2021-08-26 MED ORDER — ATORVASTATIN CALCIUM 40 MG PO TABS
40.0000 mg | ORAL_TABLET | Freq: Every day | ORAL | Status: DC
  Administered 2021-08-27 – 2021-08-28 (×2): 40 mg via ORAL
  Filled 2021-08-26 (×2): qty 1

## 2021-08-26 MED ORDER — FLUOXETINE HCL 20 MG PO CAPS
40.0000 mg | ORAL_CAPSULE | Freq: Every day | ORAL | Status: DC
  Administered 2021-08-27 – 2021-08-28 (×2): 40 mg via ORAL
  Filled 2021-08-26 (×2): qty 2
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
## 2021-08-26 NOTE — Progress Notes (Signed)
Patient arrived to rm 302 via stretcher. She is alert and oriented x4. Provided patient with bagged lunch, pt claims she is not hungry at this time. Son is at bedside, daughter notified of admission to 300 via phone call. Home meds sent down to pharmacy.

## 2021-08-26 NOTE — ED Triage Notes (Signed)
Pt presents via EMS today, daughter found her unconscious on the floor covered in feces. Pt is alert and oriented at this time. EMS reports hypotension and near syncope when they tried to stand her up. EMS states that glucose was 209.

## 2021-08-26 NOTE — ED Provider Notes (Signed)
[facility] Provider Note   CSN: [id number] Arrival date & time: 08/26/21  1551     History  Chief Complaint  Patient presents with   Loss of Consciousness    Brittney Williams is a 76 y.o. female.   Loss of Consciousness   36 female - presents from home by EMS - after daughter tried to get in touch with her - phone wasn't working - so she went to house - was slumped on cough - she was soiled with feces and urine on the couch - states she hadn't eaten in 3 days - hadn't moved in 3 days - daughter helped her get cleaned up - went to bathroom by herself - walked in by herself - daughter went to help her - she stood up from commode - got confused and then syncopized - fell sideways - daughter caught her - and she slumped over again - and syncopized again - limp on the commode. Doesn't take a blood thinner.  Still unresponsive the EMS arrived - they were able to wake her up - had no pulses at wrist - very orthostatic - step father is in the house - he is in a wheelchair - he was in the floor as well - and unresponsive.  She does fall frequently.  Has had multiple falls - but none recently that they know of.   Home Medications Prior to Admission medications   Medication Sig Start Date End Date Taking? Authorizing Provider  atorvastatin (LIPITOR) 40 MG tablet Take 40 mg by mouth daily. 01/10/19  Yes [provider]  calcium carbonate (TUMS - DOSED IN MG ELEMENTAL CALCIUM) 500 MG chewable tablet Chew 3 tablets by mouth daily.   Yes [provider]  carvedilol (COREG) 12.5 MG tablet TAKE (1) TABLET BY MOUTH TWICE DAILY WITH A MEAL. Patient taking differently: Take 12.5 mg by mouth 2 (two) times daily with a meal. 06/09/21  Yes Josue Hector, MD  dimenhyDRINATE (DRAMAMINE) 50 MG tablet Take 50 mg by mouth every 8 (eight) hours as needed for dizziness.   Yes [provider]  donepezil (ARICEPT) 10 MG tablet Take 10 mg by mouth every evening. 01/29/21   Yes [provider]  FLUoxetine (PROZAC) 40 MG capsule Take 40 mg by mouth daily. 01/06/19  Yes [provider]  levothyroxine (SYNTHROID) 25 MCG tablet Take 25 mcg by mouth daily. 01/06/19  Yes [provider]  lisinopril (ZESTRIL) 40 MG tablet Take 40 mg by mouth daily. 01/06/19  Yes [provider]  Multiple Vitamin (MULTIVITAMIN WITH MINERALS) TABS tablet Take 1 tablet by mouth daily.   Yes [provider]  oxybutynin (DITROPAN) 5 MG tablet Take 5 mg by mouth 2 (two) times a day. 01/06/19  Yes [provider]  ALPRAZolam Duanne Moron) 0.5 MG tablet Take 0.5 tablets (0.25 mg total) by mouth 2 (two) times daily as needed for anxiety. Patient not taking: Reported on 02/12/2021 01/26/19   Murlean Iba, MD  doxycycline (VIBRAMYCIN) 100 MG capsule Take 1 capsule (100 mg total) by mouth 2 (two) times daily. Patient not taking: Reported on 08/26/2021 02/12/21   Hayden Rasmussen, MD  ondansetron (ZOFRAN) 4 MG tablet Take 1 tablet (4 mg total) by mouth every 8 (eight) hours as needed for nausea or vomiting. Patient not taking: Reported on 08/26/2021 02/12/21   Hayden Rasmussen, MD  spironolactone (ALDACTONE) 25 MG tablet Take 1 tablet (25 mg total) by mouth daily. Patient not  taking: Reported on 02/12/2021 01/27/19   Murlean Iba, MD      Allergies    Bee venom, Codeine, and Hydrocodone    Review of Systems   Review of Systems  Cardiovascular:  Positive for syncope.  All other systems reviewed and are negative.  Physical Exam Updated Vital Signs BP (!) 157/55    Pulse 62    Temp 98.4 F (36.9 C) (Rectal)    Resp 14    Ht 1.6 m (5\' 3" )    Wt 59 kg    SpO2 100%    BMI 23.03 kg/m  Physical Exam Vitals and nursing note reviewed.  Constitutional:      General: She is not in acute distress.    Appearance: She is well-developed.  HENT:     Head: Normocephalic and atraumatic.     Mouth/Throat:     Pharynx: No oropharyngeal exudate.  Eyes:      General: No scleral icterus.       Right eye: No discharge.        Left eye: No discharge.     Conjunctiva/sclera: Conjunctivae normal.     Pupils: Pupils are equal, round, and reactive to light.  Neck:     Thyroid: No thyromegaly.     Vascular: No JVD.  Cardiovascular:     Rate and Rhythm: Normal rate and regular rhythm.     Heart sounds: Normal heart sounds. No murmur heard.   No friction rub. No gallop.  Pulmonary:     Effort: Pulmonary effort is normal. No respiratory distress.     Breath sounds: Normal breath sounds. No wheezing or rales.  Abdominal:     General: Bowel sounds are normal. There is no distension.     Palpations: Abdomen is soft. There is no mass.     Tenderness: There is no abdominal tenderness.  Musculoskeletal:        General: No tenderness. Normal range of motion.     Cervical back: Normal range of motion and neck supple.     Right lower leg: Edema present.     Left lower leg: Edema present.  Lymphadenopathy:     Cervical: No cervical adenopathy.  Skin:    General: Skin is warm and dry.     Findings: No erythema or rash.     Comments: Has wounds of the L hip and L knee and RLE.    Neurological:     Mental Status: She is alert.     Coordination: Coordination normal.     Comments: This patient is generally weak, has significant difficulty sitting up in the bed however is able to follow all of my commands, moves all 4 extremities, she seems to be awake, no facial asymmetry, clear speech  Psychiatric:        Behavior: Behavior normal.    ED Results / Procedures / Treatments   Labs (all labs ordered are listed, but only abnormal results are displayed) Labs Reviewed  COMPREHENSIVE METABOLIC PANEL - Abnormal; Notable for the following components:      Result Value   Glucose, Bld 161 (*)    Creatinine, Ser 1.15 (*)    Albumin 3.4 (*)    GFR, Estimated 50 (*)    All other components within normal limits  LACTIC ACID, PLASMA - Abnormal; Notable for the  following components:   Lactic Acid, Venous 2.7 (*)    All other components within normal limits  CK - Abnormal; Notable for the following  components:   Total CK 28 (*)    All other components within normal limits  URINALYSIS, ROUTINE W REFLEX MICROSCOPIC - Abnormal; Notable for the following components:   Hgb urine dipstick TRACE (*)    Leukocytes,Ua SMALL (*)    All other components within normal limits  URINALYSIS, MICROSCOPIC (REFLEX) - Abnormal; Notable for the following components:   Bacteria, UA MANY (*)    All other components within normal limits  URINE CULTURE  CBC WITH DIFFERENTIAL/PLATELET  MAGNESIUM  TROPONIN I (HIGH SENSITIVITY)    EKG EKG Interpretation  Date/Time:  Saturday August 26 2021 16:15:17 EST Ventricular Rate:  52 PR Interval:  180 QRS Duration: 131 QT Interval:  490 QTC Calculation: 456 R Axis:   172 Text Interpretation: Right and left arm electrode reversal, interpretation assumes no reversal Sinus rhythm Nonspecific intraventricular conduction delay possible Lateral infarct, acute Confirmed by Noemi Chapel [phone]) on 08/26/2021 4:22:39 PM  Radiology CT Head Wo Contrast  Result Date: 08/26/2021 CLINICAL DATA:  Found down, hypotension EXAM: CT HEAD WITHOUT CONTRAST TECHNIQUE: Contiguous axial images were obtained from the base of the skull through the vertex without intravenous contrast. RADIATION DOSE REDUCTION: This exam was performed according to the departmental dose-optimization program which includes automated exposure control, adjustment of the mA and/or kV according to patient size and/or use of iterative reconstruction technique. COMPARISON:  01/23/2019, 01/26/2019 FINDINGS: Brain: Stable confluent hypodensities throughout the periventricular white matter consistent with chronic small vessel ischemic change. No evidence of acute infarct or hemorrhage. Lateral ventricles and midline structures are unremarkable. No acute extra-axial fluid  collections. No mass effect. Vascular: No hyperdense vessel or unexpected calcification. Skull: Normal. Negative for fracture or focal lesion. Sinuses/Orbits: No acute finding. Other: None. IMPRESSION: 1. Stable head CT, no acute intracranial process. Electronically Signed   By: Randa Ngo M.D.   On: 08/26/2021 16:58   DG Chest Port 1 View  Result Date: 08/26/2021 CLINICAL DATA:  Weakness, hypotension EXAM: PORTABLE CHEST 1 VIEW COMPARISON:  02/12/2021 FINDINGS: Single frontal view of the chest demonstrates an unremarkable cardiac silhouette. No acute airspace disease, effusion, or pneumothorax. Linear opacities in the upper lung zones likely reflects subsegmental atelectasis or scarring. No acute bony abnormalities. IMPRESSION: 1. No acute intrathoracic process. Electronically Signed   By: Randa Ngo M.D.   On: 08/26/2021 16:44    Procedures Procedures    Medications Ordered in ED Medications  sodium chloride 0.9 % bolus 1,000 mL (0 mLs Intravenous Stopped 08/26/21 1846)    ED Course/ Medical Decision Making/ A&P                           Medical Decision Making This patient presents after having significant orthostasis and in fact after having a syncopal episode at home.  She had prolonged downtime witnessed by daughter as well as paramedics, she has not had any recurrent syncope here but has had hypotension and in fact when she tries to stand up her heart rate jumps by 55 bpm and she becomes very tachycardic and symptomatic.  Her laboratory work-up does not reveal any significant endorgan damage or dysfunction, she is not febrile, I would consider that this could be related all to dehydration or poor intake, she may have had a stroke.  CT will be ordered  Problems Addressed: Dehydration: acute illness or injury    Details: IV fluids given Orthostatic hypotension: acute illness or injury    Details: IV fluids given  Syncope and collapse: acute illness or injury    Details:  Monitored on a cardiac monitor, no signs of arrhythmia seen  Amount and/or Complexity of Data Reviewed Independent Historian: guardian and EMS    Details: As per history External Data Reviewed: labs.    Details: Reviewed prior work-ups, mostly followed by her family doctor for hypertensive disease Labs: ordered.    Details: No signs of acute endorgan dysfunction or urinary tract infection according to my interpretation Radiology: ordered and independent interpretation performed.    Details: CT scan of the brain without signs of stroke, chest x-ray without signs of pneumonia ECG/medicine tests: ordered and independent interpretation performed. Decision-making details documented in ED Course. Discussion of management or test interpretation with external provider(s): I discussed these results in detail with the hospitalist Dr. Sheran Lawless who will come see the patient for admission to the hospital.  Risk Decision regarding hospitalization. Risk Details: Patient improved however remains high risk due to syncope and relative bradycardia   Patient is covered in stool, at this time she has an abnormal EKG which is slightly bradycardic with a rate of 52, her QRS is slightly widened, there is some abnormal ST segments.  She will need labs including a troponin, search for source of infection, she will need hydration because she is hypotensive.  The patient is critically ill, I have spoken with the daughter who is in agreement with the plan.        Final Clinical Impression(s) / ED Diagnoses Final diagnoses:  Orthostatic hypotension  Syncope and collapse  Dehydration    Rx / DC Orders ED Discharge Orders     None         Noemi Chapel, MD 08/26/21 [phone]

## 2021-08-27 ENCOUNTER — Other Ambulatory Visit (HOSPITAL_COMMUNITY): Payer: Self-pay | Admitting: *Deleted

## 2021-08-27 ENCOUNTER — Observation Stay (HOSPITAL_BASED_OUTPATIENT_CLINIC_OR_DEPARTMENT_OTHER): Payer: Medicare HMO

## 2021-08-27 DIAGNOSIS — I5043 Acute on chronic combined systolic (congestive) and diastolic (congestive) heart failure: Secondary | ICD-10-CM

## 2021-08-27 DIAGNOSIS — R55 Syncope and collapse: Secondary | ICD-10-CM

## 2021-08-27 LAB — CBC
HCT: 37.8 % (ref 36.0–46.0)
Hemoglobin: 12 g/dL (ref 12.0–15.0)
MCH: 29.2 pg (ref 26.0–34.0)
MCHC: 31.7 g/dL (ref 30.0–36.0)
MCV: 92 fL (ref 80.0–100.0)
Platelets: 239 10*3/uL (ref 150–400)
RBC: 4.11 MIL/uL (ref 3.87–5.11)
RDW: 14.6 % (ref 11.5–15.5)
WBC: 5.9 10*3/uL (ref 4.0–10.5)
nRBC: 0 % (ref 0.0–0.2)

## 2021-08-27 LAB — BASIC METABOLIC PANEL
Anion gap: 6 (ref 5–15)
BUN: 13 mg/dL (ref 8–23)
CO2: 25 mmol/L (ref 22–32)
Calcium: 8.7 mg/dL — ABNORMAL LOW (ref 8.9–10.3)
Chloride: 109 mmol/L (ref 98–111)
Creatinine, Ser: 1.04 mg/dL — ABNORMAL HIGH (ref 0.44–1.00)
GFR, Estimated: 56 mL/min — ABNORMAL LOW (ref >60)
Glucose, Bld: 113 mg/dL — ABNORMAL HIGH (ref 70–99)
Potassium: 2.8 mmol/L — ABNORMAL LOW (ref 3.5–5.1)
Sodium: 140 mmol/L (ref 135–145)

## 2021-08-27 LAB — ECHOCARDIOGRAM COMPLETE
Area-P 1/2: 4.68 cm2
Calc EF: 49.5 %
Height: 63 in
S' Lateral: 3.4 cm
Single Plane A2C EF: 55.2 %
Single Plane A4C EF: 41.2 %
Weight: 2190.49 oz

## 2021-08-27 LAB — TSH: TSH: 4.404 u[IU]/mL (ref 0.350–4.500)

## 2021-08-27 MED ORDER — POTASSIUM CHLORIDE CRYS ER 20 MEQ PO TBCR
40.0000 meq | EXTENDED_RELEASE_TABLET | ORAL | Status: AC
  Administered 2021-08-27 (×2): 40 meq via ORAL
  Filled 2021-08-27 (×2): qty 2

## 2021-08-27 MED ORDER — ENSURE ENLIVE PO LIQD: 237.0000 mL | Freq: Three times a day (TID) | ORAL | Status: DC

## 2021-08-27 MED ORDER — ADULT MULTIVITAMIN W/MINERALS CH
1.0000 | ORAL_TABLET | Freq: Every day | ORAL | Status: DC
  Administered 2021-08-27 – 2021-08-28 (×2): 1 via ORAL
  Filled 2021-08-27 (×2): qty 1

## 2021-08-27 MED ORDER — ACETAMINOPHEN 325 MG PO TABS
650.0000 mg | ORAL_TABLET | Freq: Four times a day (QID) | ORAL | Status: DC | PRN
  Administered 2021-08-27: 650 mg via ORAL
  Filled 2021-08-27: qty 2
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
## 2021-08-27 NOTE — Progress Notes (Signed)
PROGRESS NOTE     Brittney Williams, is a 76 y.o. female, DOB - 1946-04-09, NTZ:[id number]  Admit date - 08/26/2021   Admitting Physician Truett Mainland, DO  Outpatient Primary MD for the patient is Redmond School, MD  LOS - 0  Chief Complaint  Patient presents with   Loss of Consciousness        Brief Narrative:  76 y.o. female with a history of combined systolic and diastolic dysfunction CHF, GERD, hypertension admitted on 08/26/2021 after being found at home with concerns for fall/syncope-  Assessment & Plan:   Principal Problem:   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 %/ G2 DD Active Problems:   Essential hypertension   GERD (gastroesophageal reflux disease)   Syncope and collapse   Failure to thrive in adult   1)--  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 %/ G2 DD- -Echo from 08/27/2020 noted without aortic stenosis, EF 67%, grade 2 diastolic dysfunction noted, -Continue Coreg, and lisinopril -Hold Aldactone  2)Falls Vs Syncope --TSH WNL, -CBC WNL, -Echo as noted above #1 -PT eval noted recommends home health PT, however Patient and her husband were both found at home after---falling and there was Feces/urine all over the place------house is a mess---Unsafe discharge at this time--- Recurrent falls---PTA pt lived at home and did very poorly, patient has significant limitations with mobility related ADLs- this patient needs to continue to be monitored in the hospital until a Safe enviroments (??SNF bed) is found as she is not safe to go home with her current physcical and cognitive limitations----  -husband is currently in inpatient with acute medical problems as well  3) hypokalemia--- replace and recheck  4) dehydration and lactic acidosis----due to poor oral intake in a patient who was found after falling for prolonged period of time--be judicious with IV fluids due to heart  failure  5) dementia--- continue Aricept and Prozac  6) Hypothyroidism--- continue levothyroxine  Disposition/Need for in-Hospital Stay- patient unable to be discharged at this time due to --Unsafe discharge at this time, please see #2 above  Disposition: The patient is from: Home              Anticipated d/c is to: SNF              Anticipated d/c date is: 1 day              Patient currently is not medically stable to d/c. Barriers: Not Clinically Stable-   Code Status :  -  Code Status: Full Code   Family Communication:    (patient is alert, awake and coherent) and her husband  Consults  :  Na  DVT Prophylaxis  :   - SCDs  enoxaparin (LOVENOX) injection 40 mg Start: 08/27/21 0600    Lab Results  Component Value Date   PLT 239 08/27/2021    Inpatient Medications  Scheduled Meds:  atorvastatin  40 mg Oral Daily   carvedilol  12.5 mg Oral BID WC   donepezil  10 mg Oral QPM   enoxaparin (LOVENOX) injection  40 mg Subcutaneous Q24H   feeding supplement  237 mL Oral TID BM   FLUoxetine  40 mg Oral Daily   levothyroxine  25 mcg Oral Q0600   lisinopril  40 mg Oral Daily   multivitamin with minerals  1 tablet Oral Daily   oxybutynin  5 mg Oral BID   Continuous Infusions:  lactated ringers 100 mL/hr at 08/27/21 0841   PRN Meds:.ondansetron **OR** ondansetron (ZOFRAN) IV   Anti-infectives (From admission, onward)    None         Subjective: Brittney Williams today has no fevers, no emesis,  No chest pain,   - Fatigue , weakness, gait concerns persist,   Objective: Vitals:   08/27/21 0555 08/27/21 0601 08/27/21 0841 08/27/21 1225  BP:  (!) 173/62 (!) 165/89 (!) 157/54  Pulse:  66 81 62  Resp:  16    Temp:  98.4 F (36.9 C)  98.2 F (36.8 C)  TempSrc:  Oral  Oral  SpO2:  98%  100%  Weight: 62.1 kg     Height:        Intake/Output Summary (Last 24 hours) at 08/27/2021 1858 Last data filed at 08/27/2021 1700 Gross per 24 hour  Intake 2193.49 ml  Output  700 ml  Net 1493.49 ml   Filed Weights   08/26/21 1557 08/27/21 0555  Weight: 59 kg 62.1 kg     Physical Exam  Gen:- Awake Alert,  in no apparent distress  HEENT:- .AT, No sclera icterus Neck-Supple Neck,No JVD,.  Lungs-  CTAB , fair symmetrical air movement CV- S1, S2 normal, regular  Abd-  +ve B.Sounds, Abd Soft, No tenderness,    Extremity/Skin:-pedal pulses present  Psych-mild cognitive and memory deficits, Neuro-generalized weakness, no new focal deficits, no tremors  Data Reviewed: I have personally reviewed following labs and imaging studies  CBC: Recent Labs  Lab 08/26/21 1623 08/27/21 0413  WBC 5.4 5.9  NEUTROABS 3.8  --   HGB 14.1 12.0  HCT 44.5 37.8  MCV 92.5 92.0  PLT 255 322   Basic Metabolic Panel: Recent Labs  Lab 08/26/21 1623 08/27/21 0413  NA 140 140  K 3.5 2.8*  CL 106 109  CO2 22 25  GLUCOSE 161* 113*  BUN 13 13  CREATININE 1.15* 1.04*  CALCIUM 9.0 8.7*  MG 1.9  --    GFR: Estimated Creatinine Clearance: 38.7 mL/min (A) (by C-G formula based on SCr of 1.04 mg/dL (H)). Liver Function Tests: Recent Labs  Lab 08/26/21 1623  AST 16  ALT 8  ALKPHOS 80  BILITOT 1.1  PROT 6.6  ALBUMIN 3.4*   No results for input(s): LIPASE, AMYLASE in the last 168 hours. No results for input(s): AMMONIA in the last 168 hours. Coagulation Profile: No results for input(s): INR, PROTIME in the last 168 hours. Cardiac Enzymes: Recent Labs  Lab 08/26/21 1623  CKTOTAL 28*   BNP (last 3 results) No results for input(s): PROBNP in the last 8760 hours. HbA1C: No results for input(s): HGBA1C in the last 72 hours. CBG: No results for input(s): GLUCAP in the last 168 hours. Lipid Profile: No results for input(s): CHOL, HDL, LDLCALC, TRIG, CHOLHDL, LDLDIRECT in the last 72 hours. Thyroid Function Tests: Recent Labs    08/27/21 0413  TSH 4.404   Anemia Panel: No results for input(s): VITAMINB12, FOLATE, FERRITIN, TIBC, IRON, RETICCTPCT in the  last 72 hours. Urine analysis:    Component Value Date/Time   COLORURINE YELLOW 08/26/2021 1836   APPEARANCEUR CLEAR 08/26/2021 1836   LABSPEC 1.010 08/26/2021 1836   PHURINE 7.0 08/26/2021 1836   GLUCOSEU NEGATIVE 08/26/2021 1836   HGBUR TRACE (A) 08/26/2021 1836   BILIRUBINUR NEGATIVE 08/26/2021 1836   KETONESUR NEGATIVE 08/26/2021 1836   PROTEINUR NEGATIVE 08/26/2021 1836   NITRITE NEGATIVE 08/26/2021 1836   LEUKOCYTESUR SMALL (A) 08/26/2021 1836  Sepsis Labs: @LABRCNTIP (procalcitonin:4,lacticidven:4)  ) Recent Results (from the past 240 hour(s))  Resp Panel by RT-PCR (Flu A&B, Covid) Nasopharyngeal Swab     Status: None   Collection Time: 08/26/21  8:24 PM   Specimen: Nasopharyngeal Swab; Nasopharyngeal(NP) swabs in vial transport medium  Result Value Ref Range Status   SARS Coronavirus 2 by RT PCR NEGATIVE NEGATIVE Final    Comment: (NOTE) SARS-CoV-2 target nucleic acids are NOT DETECTED.  The SARS-CoV-2 RNA is generally detectable in upper respiratory specimens during the acute phase of infection. The lowest concentration of SARS-CoV-2 viral copies this assay can detect is 138 copies/mL. A negative result does not preclude SARS-Cov-2 infection and should not be used as the sole basis for treatment or other patient management decisions. A negative result may occur with  improper specimen collection/handling, submission of specimen other than nasopharyngeal swab, presence of viral mutation(s) within the areas targeted by this assay, and inadequate number of viral copies(<138 copies/mL). A negative result must be combined with clinical observations, patient history, and epidemiological information. The expected result is Negative.  Fact Sheet for Patients:  EntrepreneurPulse.com.au  Fact Sheet for Healthcare Providers:  IncredibleEmployment.be  This test is no t yet approved or cleared by the [place] FDA and  has been  authorized for detection and/or diagnosis of SARS-CoV-2 by FDA under an Emergency Use Authorization (EUA). This EUA will remain  in effect (meaning this test can be used) for the duration of the COVID-19 declaration under Section 564(b)(1) of the Act, 21 U.S.C.section 360bbb-3(b)(1), unless the authorization is terminated  or revoked sooner.       Influenza A by PCR NEGATIVE NEGATIVE Final   Influenza B by PCR NEGATIVE NEGATIVE Final    Comment: (NOTE) The Xpert Xpress SARS-CoV-2/FLU/RSV plus assay is intended as an aid in the diagnosis of influenza from Nasopharyngeal swab specimens and should not be used as a sole basis for treatment. Nasal washings and aspirates are unacceptable for Xpert Xpress SARS-CoV-2/FLU/RSV testing.  Fact Sheet for Patients: EntrepreneurPulse.com.au  Fact Sheet for Healthcare Providers: IncredibleEmployment.be  This test is not yet approved or cleared by the [place] FDA and has been authorized for detection and/or diagnosis of SARS-CoV-2 by FDA under an Emergency Use Authorization (EUA). This EUA will remain in effect (meaning this test can be used) for the duration of the COVID-19 declaration under Section 564(b)(1) of the Act, 21 U.S.C. section 360bbb-3(b)(1), unless the authorization is terminated or revoked.  Performed at [facility], [address], [place], [place] [address]       Radiology Studies: CT Head Wo Contrast  Result Date: 08/26/2021 CLINICAL DATA:  Found down, hypotension EXAM: CT HEAD WITHOUT CONTRAST TECHNIQUE: Contiguous axial images were obtained from the base of the skull through the vertex without intravenous contrast. RADIATION DOSE REDUCTION: This exam was performed according to the departmental dose-optimization program which includes automated exposure control, adjustment of the mA and/or kV according to patient size and/or use of iterative reconstruction technique. COMPARISON:   01/23/2019, 01/26/2019 FINDINGS: Brain: Stable confluent hypodensities throughout the periventricular white matter consistent with chronic small vessel ischemic change. No evidence of acute infarct or hemorrhage. Lateral ventricles and midline structures are unremarkable. No acute extra-axial fluid collections. No mass effect. Vascular: No hyperdense vessel or unexpected calcification. Skull: Normal. Negative for fracture or focal lesion. Sinuses/Orbits: No acute finding. Other: None. IMPRESSION: 1. Stable head CT, no acute intracranial process. Electronically Signed   By: Randa Ngo M.D.   On:  08/26/2021 16:58   DG Chest Port 1 View  Result Date: 08/26/2021 CLINICAL DATA:  Weakness, hypotension EXAM: PORTABLE CHEST 1 VIEW COMPARISON:  02/12/2021 FINDINGS: Single frontal view of the chest demonstrates an unremarkable cardiac silhouette. No acute airspace disease, effusion, or pneumothorax. Linear opacities in the upper lung zones likely reflects subsegmental atelectasis or scarring. No acute bony abnormalities. IMPRESSION: 1. No acute intrathoracic process. Electronically Signed   By: Randa Ngo M.D.   On: 08/26/2021 16:44   ECHOCARDIOGRAM COMPLETE  Result Date: 08/27/2021    ECHOCARDIOGRAM REPORT   Patient Name:   Brittney Williams Date of Exam: 08/27/2021 Medical Rec #:  [id number]        Height:       63.0 in Accession #:    [id number]       Weight:       136.9 lb Date of Birth:  1945-10-08        BSA:          1.646 m Patient Age:    66 years         BP:           165/89 mmHg Patient Gender: F                HR:           77 bpm. Exam Location:  [facility] Procedure: 2D Echo, 3D Echo, Cardiac Doppler and Color Doppler Indications:    R55 Syncope  History:        Patient has prior history of Echocardiogram examinations, most                 recent 01/24/2019. CHF, Signs/Symptoms:Syncope, Altered Mental                 Status and Chest Pain; Risk Factors:Hypertension, Dyslipidemia                 and  Current Smoker.  Sonographer:    Roseanna Rainbow RDCS Referring Phys: [phone] JACOB J STINSON  Sonographer Comments: Technically difficult study due to poor echo windows. IMPRESSIONS  1. Left ventricular ejection fraction, by estimation, is 45 to 50%. The left ventricle has mildly decreased function. The left ventricle demonstrates global hypokinesis. There is paradoxical septal motion due to LBBB which likely is the primary driver of reduced EF. Left ventricular diastolic parameters are consistent with Grade II diastolic dysfunction (pseudonormalization).  2. Right ventricular systolic function is normal. The right ventricular size is normal. Tricuspid regurgitation signal is inadequate for assessing PA pressure.  3. Left atrial size was mildly dilated.  4. The mitral valve is normal in structure. Trivial mitral valve regurgitation.  5. The aortic valve is tricuspid. There is mild calcification of the aortic valve. There is mild thickening of the aortic valve. Aortic valve regurgitation is not visualized. Aortic valve sclerosis/calcification is present, without any evidence of aortic stenosis.  6. The inferior vena cava is normal in size with greater than 50% respiratory variability, suggesting right atrial pressure of 3 mmHg. Comparison(s): Compared to prior TTE in 2020, the EF appears to have improved from ~40% to ~45%. Otherwise, there is no significant change. FINDINGS  Left Ventricle: Left ventricular ejection fraction, by estimation, is 45 to 50%. The left ventricle has mildly decreased function. The left ventricle demonstrates global hypokinesis. 3D left ventricular ejection fraction analysis performed but not reported based on interpreter judgement due to suboptimal tracking. The left ventricular internal cavity size was normal in  size. There is mild left ventricular hypertrophy. Abnormal (paradoxical) septal motion, consistent with left bundle branch block. Left ventricular diastolic parameters are consistent with  Grade II diastolic dysfunction (pseudonormalization). Right Ventricle: The right ventricular size is normal. No increase in right ventricular wall thickness. Right ventricular systolic function is normal. Tricuspid regurgitation signal is inadequate for assessing PA pressure. Left Atrium: Left atrial size was mildly dilated. Right Atrium: Right atrial size was normal in size. Pericardium: There is no evidence of pericardial effusion. Mitral Valve: The mitral valve is normal in structure. There is mild thickening of the mitral valve leaflet(s). There is mild calcification of the mitral valve leaflet(s). Mild mitral annular calcification. Trivial mitral valve regurgitation. Tricuspid Valve: The tricuspid valve is normal in structure. Tricuspid valve regurgitation is trivial. Aortic Valve: The aortic valve is tricuspid. There is mild calcification of the aortic valve. There is mild thickening of the aortic valve. Aortic valve regurgitation is not visualized. Aortic valve sclerosis/calcification is present, without any evidence of aortic stenosis. Pulmonic Valve: The pulmonic valve was not well visualized. Pulmonic valve regurgitation is trivial. Aorta: The aortic root and ascending aorta are structurally normal, with no evidence of dilitation. Venous: The inferior vena cava is normal in size with greater than 50% respiratory variability, suggesting right atrial pressure of 3 mmHg. IAS/Shunts: The atrial septum is grossly normal.  LEFT VENTRICLE PLAX 2D LVIDd:         4.60 cm     Diastology LVIDs:         3.40 cm     LV e' medial:    3.12 cm/s LV PW:         1.10 cm     LV E/e' medial:  28.1 LV IVS:        1.20 cm     LV e' lateral:   5.06 cm/s LVOT diam:     1.70 cm     LV E/e' lateral: 17.4 LV SV:         41 LV SV Index:   25 LVOT Area:     2.27 cm                             3D Volume EF: LV Volumes (MOD)           3D EF:        41 % LV vol d, MOD A2C: 66.1 ml LV EDV:       153 ml LV vol d, MOD A4C: 66.8 ml LV ESV:        91 ml LV vol s, MOD A2C: 29.6 ml LV SV:        62 ml LV vol s, MOD A4C: 39.3 ml LV SV MOD A2C:     36.5 ml LV SV MOD A4C:     66.8 ml LV SV MOD BP:      33.4 ml RIGHT VENTRICLE            IVC RV Basal diam:  2.40 cm    IVC diam: 1.40 cm RV S prime:     9.30 cm/s TAPSE (M-mode): 1.7 cm LEFT ATRIUM             Index        RIGHT ATRIUM          Index LA diam:        2.85 cm 1.73 cm/m   RA Area:  7.29 cm LA Vol (A2C):   51.8 ml 31.47 ml/m  RA Volume:   11.80 ml 7.17 ml/m LA Vol (A4C):   47.1 ml 28.62 ml/m LA Biplane Vol: 49.3 ml 29.95 ml/m  AORTIC VALVE LVOT Vmax:   91.30 cm/s LVOT Vmean:  54.800 cm/s LVOT VTI:    0.179 m  AORTA Ao Root diam: 2.90 cm Ao Asc diam:  2.80 cm MITRAL VALVE MV Area (PHT): 4.68 cm    SHUNTS MV Decel Time: 162 msec    Systemic VTI:  0.18 m MV E velocity: 87.80 cm/s  Systemic Diam: 1.70 cm MV A velocity: 91.70 cm/s MV E/A ratio:  0.96 Gwyndolyn Kaufman MD Electronically signed by Gwyndolyn Kaufman MD Signature Date/Time: 08/27/2021/11:59:54 AM    Final      Scheduled Meds:  atorvastatin  40 mg Oral Daily   carvedilol  12.5 mg Oral BID WC   donepezil  10 mg Oral QPM   enoxaparin (LOVENOX) injection  40 mg Subcutaneous Q24H   feeding supplement  237 mL Oral TID BM   FLUoxetine  40 mg Oral Daily   levothyroxine  25 mcg Oral Q0600   lisinopril  40 mg Oral Daily   multivitamin with minerals  1 tablet Oral Daily   oxybutynin  5 mg Oral BID   Continuous Infusions:  lactated ringers 100 mL/hr at 08/27/21 0841     LOS: 0 days    Roxan Hockey M.D on 08/27/2021 at 6:58 PM  Go to [email or web address] - for contact info  [facility] - Office  [phone]  If 7PM-7AM, please contact night-coverage [email or web address] Password [facility] 08/27/2021, 6:58 PM

## 2021-08-27 NOTE — Progress Notes (Signed)
Initial Nutrition Assessment  DOCUMENTATION CODES:   Not applicable  INTERVENTION:   -Ensure Enlive po TID, each supplement provides 350 kcal and 20 grams of protein  -MVI with minerals daily  NUTRITION DIAGNOSIS:   Inadequate oral intake related to poor appetite as evidenced by meal completion < 50%.  GOAL:   Patient will meet greater than or equal to 90% of their needs  MONITOR:   PO intake, Supplement acceptance, Labs, Skin, I & O's  REASON FOR ASSESSMENT:   Consult Assessment of nutrition requirement/status  ASSESSMENT:   Brittney Williams is a 76 y.o. female with a history of systolic heart failure, GERD, hypertension, arthritis.  Patient presents after being found on her couch this morning.  She was found by her daughter because the patient did not answer her phone.  The patient had been on the couch for approximately 3 days and had not had anything to eat or drink.  She was found in her own urine and excrement.  Patient got up to use the bathroom and had a syncopal episode that lasted for "a while".  EMS was called and the patient was transported to the hospital for evaluation.  No palliating or provoking factors.  Pt admitted with FTT and syncopal episode.   Reviewed I/O's: +1.5 L x 24 hours  Pt unavailable at time of visit. Attempted to speak with pt via call to hospital room phone, however, unable to reach. RD unable to obtain further nutrition-related history or complete nutrition-focused physical exam at this time.     Per TOC notes, pt with poor oral intake x 3 days PTA. Pt daughter considering home health services and meals on wheels for the patient.   Pt on a regular diet with poor oral intake. Noted meal completions 25-50%.   Reviewed wt hx; pt has experienced a 2.2% wt loss over the past 6 months, which is not significant for time frame.   Pt with poor oral intake and would benefit from addition of oral nutrition supplements.   Medications reviewed and  include lactated ringers infusion @ 100 ml/hr.   Labs reviewed: K: 2.8.   Diet Order:   Diet Order             Diet regular Room service appropriate? Yes; Fluid consistency: Thin  Diet effective now                   EDUCATION NEEDS:   No education needs have been identified at this time  Skin:  Skin Assessment: Reviewed RN Assessment  Last BM:  08/26/20  Height:   Ht Readings from Last 1 Encounters:  08/26/21 5\' 3"  (1.6 m)    Weight:   Wt Readings from Last 1 Encounters:  08/27/21 62.1 kg    Ideal Body Weight:  52.3 kg  BMI:  Body mass index is 24.25 kg/m.  Estimated Nutritional Needs:   Kcal:  1650-1850  Protein:  80-95 grams  Fluid:  > 1.6 L    Loistine Chance, RD, LDN, [place] Registered Dietitian II Certified Diabetes Care and Education Specialist Please refer to [facility] for RD and/or RD on-call/weekend/after hours pager

## 2021-08-27 NOTE — Progress Notes (Signed)
°  Echocardiogram 2D Echocardiogram has been performed.  Brittney Williams 08/27/2021, 11:38 AM

## 2021-08-27 NOTE — Plan of Care (Signed)
°  Problem: Acute Rehab PT Goals(only PT should resolve) Goal: Pt Will Ambulate Flowsheets (Taken 08/27/2021 1234) Pt will Ambulate:  > 125 feet  with rolling walker  with modified independence Goal: Pt Will Go Up/Down Stairs Flowsheets (Taken 08/27/2021 1234) Pt will Go Up / Down Stairs:  3-5 stairs  with modified independence  with rail(s) Goal: Pt/caregiver will Perform Home Exercise Program Flowsheets (Taken 08/27/2021 1234) Pt/caregiver will Perform Home Exercise Program:  For increased strengthening  For improved balance  With Supervision, verbal cues required/provided  12:35 PM, 08/27/21 Josue Hector PT DPT  Physical Therapist with [facility]  [phone]

## 2021-08-27 NOTE — Evaluation (Signed)
Physical Therapy Evaluation Patient Details Name: Brittney Williams MRN: [id number] DOB: 04-30-1946 Today's Date: 08/27/2021  History of Present Illness  Brittney Williams is a 76 y.o. female with a history of systolic heart failure, GERD, hypertension, arthritis.  Patient presents after being found on her couch this morning.  She was found by her daughter because the patient did not answer her phone.  The patient had been on the couch for approximately 3 days and had not had anything to eat or drink.  She was found in her own urine and excrement.  Patient got up to use the bathroom and had a syncopal episode that lasted for "a while".  EMS was called and the patient was transported to the hospital for evaluation.  No palliating or provoking factors.   Clinical Impression  Patient presents supine in bed, is awake, alert and cooperative. Patient performs bed mobility Mod I and transfers Mod I. Patient able to ambulate 50 feet in room and hallway using RW under supervision. Patient demos good stability and navigation of turns with no LOB though ambulated with noted decrease in stride and with flexed trunk. Patient reporting no distress or fatigue but heart monitor noting intermittent irregularity in heart rhythm. Patient returned to bedside and vitals quickly return to baseline. Patient easily able to reposition in bed, and also able to don socks independently. Patient may benefit may benefit from home health evaluation for safety and gait training using AD. Patient left upright in bed with phone and call bell in reach, bed alarm set and nursing staff present in room. Patient will benefit from continued physical therapy in hospital and recommended venue below to increase strength, balance, endurance for safe ADLs and gait.         Recommendations for follow up therapy are one component of a multi-disciplinary discharge planning process, led by the attending physician.  Recommendations may be updated based on  patient status, additional functional criteria and insurance authorization.  Follow Up Recommendations Home health PT    Assistance Recommended at Discharge Intermittent Supervision/Assistance  Patient can return home with the following  Help with stairs or ramp for entrance;A little help with walking and/or transfers;A little help with bathing/dressing/bathroom;Assistance with cooking/housework    Equipment Recommendations None recommended by PT  Recommendations for Other Services       Functional Status Assessment Patient has had a recent decline in their functional status and demonstrates the ability to make significant improvements in function in a reasonable and predictable amount of time.     Precautions / Restrictions Precautions Precautions: None Restrictions Weight Bearing Restrictions: No      Mobility  Bed Mobility Overal bed mobility: Modified Independent             General bed mobility comments: Use of bed rails    Transfers Overall transfer level: Modified independent                 General transfer comment: UE use and bed rails for bed mobility    Ambulation/Gait Ambulation/Gait assistance: Supervision Gait Distance (Feet): 50 Feet Assistive device: Rolling walker (2 wheels) Gait Pattern/deviations: Decreased stride length;Trunk flexed       General Gait Details: small step gait, flexed trunk, good stability overall no LOB, no reported fatigue but increased vitals per monitor, quickly return to baseline upon resting  Stairs            Wheelchair Mobility    Modified Rankin (Stroke Patients Only)  Balance Overall balance assessment: Modified Independent                                           Pertinent Vitals/Pain Pain Assessment: No/denies pain    Home Living Family/patient expects to be discharged to:: Private residence Living Arrangements: Spouse/significant other Available Help at  Discharge: Family;Available PRN/intermittently Type of Home: Apartment Home Access: Level entry       Home Layout: One level Home Equipment: Conservation officer, nature (2 wheels);Shower seat;BSC/3in1 Additional Comments: Daughter lives nearby and is available PRN. Husband is currently admitted to hospital as well. Patient reports entry level to home but describes 4 steps to get to threshold    Prior Function Prior Level of Function : Independent/Modified Independent               ADLs Comments: Patient reports use of RW for community ambulation but no AD for in home     Hand Dominance        Extremity/Trunk Assessment   Upper Extremity Assessment Upper Extremity Assessment: Overall WFL for tasks assessed    Lower Extremity Assessment Lower Extremity Assessment: Overall WFL for tasks assessed       Communication   Communication: HOH  Cognition Arousal/Alertness: Awake/alert Behavior During Therapy: WFL for tasks assessed/performed Overall Cognitive Status: Within Functional Limits for tasks assessed                                          General Comments General comments (skin integrity, edema, etc.): Good seated balance, requires RW for standing balance    Exercises     Assessment/Plan    PT Assessment Patient needs continued PT services  PT Problem List Decreased strength;Decreased mobility;Decreased activity tolerance       PT Treatment Interventions Stair training;Gait training;Therapeutic exercise;Therapeutic activities;Patient/family education;Functional mobility training;Balance training    PT Goals (Current goals can be found in the Care Plan section)  Acute Rehab PT Goals Patient Stated Goal: Return home PT Goal Formulation: With patient Time For Goal Achievement: 09/10/21 Potential to Achieve Goals: Good    Frequency Min 3X/week     Co-evaluation               AM-PAC PT "6 Clicks" Mobility  Outcome Measure Help needed  turning from your back to your side while in a flat bed without using bedrails?: A Little Help needed moving from lying on your back to sitting on the side of a flat bed without using bedrails?: A Little Help needed moving to and from a bed to a chair (including a wheelchair)?: None Help needed standing up from a chair using your arms (e.g., wheelchair or bedside chair)?: None Help needed to walk in hospital room?: A Little Help needed climbing 3-5 steps with a railing? : A Little 6 Click Score: 20    End of Session Equipment Utilized During Treatment: Gait belt Activity Tolerance: Patient tolerated treatment well Patient left: in bed;with nursing/sitter in room;with call bell/phone within reach;with bed alarm set Nurse Communication: Mobility status PT Visit Diagnosis: Muscle weakness (generalized) (M62.81);Other abnormalities of gait and mobility (R26.89)    Time: 6283-6629 PT Time Calculation (min) (ACUTE ONLY): 23 min   Charges:   PT Evaluation $PT Eval Low Complexity: 1 Low PT Treatments $Therapeutic  Activity: 8-22 mins      12:33 PM, 08/27/21 Josue Hector PT DPT  Physical Therapist with [facility]  [phone]

## 2021-08-28 DIAGNOSIS — I5043 Acute on chronic combined systolic (congestive) and diastolic (congestive) heart failure: Secondary | ICD-10-CM | POA: Diagnosis not present

## 2021-08-28 DIAGNOSIS — N390 Urinary tract infection, site not specified: Secondary | ICD-10-CM | POA: Diagnosis present

## 2021-08-28 LAB — BASIC METABOLIC PANEL
Anion gap: 4 — ABNORMAL LOW (ref 5–15)
BUN: 8 mg/dL (ref 8–23)
CO2: 25 mmol/L (ref 22–32)
Calcium: 8 mg/dL — ABNORMAL LOW (ref 8.9–10.3)
Chloride: 110 mmol/L (ref 98–111)
Creatinine, Ser: 0.91 mg/dL (ref 0.44–1.00)
GFR, Estimated: >60 mL/min (ref >60)
Glucose, Bld: 142 mg/dL — ABNORMAL HIGH (ref 70–99)
Potassium: 3.3 mmol/L — ABNORMAL LOW (ref 3.5–5.1)
Sodium: 139 mmol/L (ref 135–145)

## 2021-08-28 MED ORDER — HYDRALAZINE HCL 25 MG PO TABS: 25.0000 mg | ORAL_TABLET | Freq: Three times a day (TID) | ORAL | 4 refills | Status: DC

## 2021-08-28 MED ORDER — SODIUM CHLORIDE 0.9 % IV SOLN
1.0000 g | Freq: Once | INTRAVENOUS | Status: AC
  Administered 2021-08-28: 1 g via INTRAVENOUS
  Filled 2021-08-28: qty 10

## 2021-08-28 MED ORDER — CARVEDILOL 12.5 MG PO TABS: 12.5000 mg | ORAL_TABLET | Freq: Two times a day (BID) | ORAL | 5 refills | Status: DC

## 2021-08-28 MED ORDER — FLUOXETINE HCL 40 MG PO CAPS
40.0000 mg | ORAL_CAPSULE | Freq: Every day | ORAL | 3 refills | Status: AC
Start: 2021-08-28 — End: ?

## 2021-08-28 MED ORDER — ACETAMINOPHEN 325 MG PO TABS: 650.0000 mg | ORAL_TABLET | Freq: Four times a day (QID) | ORAL | 0 refills | Status: AC | PRN

## 2021-08-28 MED ORDER — SPIRONOLACTONE 25 MG PO TABS: 25.0000 mg | ORAL_TABLET | Freq: Every day | ORAL | 4 refills | Status: DC

## 2021-08-28 MED ORDER — DONEPEZIL HCL 10 MG PO TABS: 10.0000 mg | ORAL_TABLET | Freq: Every evening | ORAL | 5 refills | Status: DC

## 2021-08-28 MED ORDER — ISOSORBIDE MONONITRATE ER 30 MG PO TB24: 30.0000 mg | ORAL_TABLET | Freq: Every day | ORAL | 11 refills | Status: DC

## 2021-08-28 MED ORDER — CEFDINIR 300 MG PO CAPS
300.0000 mg | ORAL_CAPSULE | Freq: Two times a day (BID) | ORAL | 0 refills | Status: AC
Start: 2021-08-28 — End: 2021-09-02

## 2021-08-28 MED ORDER — LEVOTHYROXINE SODIUM 25 MCG PO TABS: 25.0000 ug | ORAL_TABLET | Freq: Every day | ORAL | 5 refills | Status: AC

## 2021-08-28 MED ORDER — OXYBUTYNIN CHLORIDE 5 MG PO TABS: 5.0000 mg | ORAL_TABLET | Freq: Two times a day (BID) | ORAL | 2 refills | Status: DC

## 2021-08-28 MED ORDER — LISINOPRIL 40 MG PO TABS: 40.0000 mg | ORAL_TABLET | Freq: Every day | ORAL | 5 refills | Status: DC

## 2021-08-28 NOTE — Progress Notes (Signed)
Patient stated this morning that she was feeling much stronger and wanted to do whatever she could to get back home. She remained alert and oriented times 4 this shift.

## 2021-08-28 NOTE — Discharge Instructions (Addendum)
°  1)Take Omnicef antibiotic twice daily for 5 days for E coli Urinary Tract Infection--Please Start Omnicef on Tuesday 08/29/2021 2)Use walker, Avoid Falls and get Life Alert or similar monitoring Device 3)Repeat CBC and BMP Blood Test on Friday 09/01/2021 with [facility], MD ([facility]) 4)Very low-salt diet advised 5)Weigh yourself daily, call if you gain more than 3 pounds in 1 day or more than 5 pounds in 1 week as your diuretic medications may need to be adjusted
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
## 2021-08-28 NOTE — Progress Notes (Signed)
Patients medications retrieved from pharmacy and returned to patient. Signed hard copy and placed in medical record, yellow copy given to patient . Awaiting her daughter to pick up ,. AVS has been printed. Given IV rocephin as ordered. Will continue to monitor .

## 2021-08-28 NOTE — Progress Notes (Signed)
Physical Therapy Treatment Patient Details Name: Brittney Williams MRN: [id number] DOB: [birth year] Today's Date: 08/28/2021   History of Present Illness Brittney Williams is a 76 y.o. female with a history of systolic heart failure, GERD, hypertension, arthritis.  Patient presents after being found on her couch this morning.  She was found by her daughter because the patient did not answer her phone.  The patient had been on the couch for approximately 3 days and had not had anything to eat or drink.  She was found in her own urine and excrement.  Patient got up to use the bathroom and had a syncopal episode that lasted for "a while".  EMS was called and the patient was transported to the hospital for evaluation.  No palliating or provoking factors.    PT Comments    Patient demonstrates good return for sitting up at bedside with slightly increased time, occasional leaning on nearby objects when walking in room without an AD, required RW for safety and demonstrated good return for ambulation in hallway way without loss of balance.  Patient tolerated sitting up in chair after therapy.  Patient will benefit from continued skilled physical therapy in hospital and recommended venue below to increase strength, balance, endurance for safe ADLs and gait.    Recommendations for follow up therapy are one component of a multi-disciplinary discharge planning process, led by the attending physician.  Recommendations may be updated based on patient status, additional functional criteria and insurance authorization.  Follow Up Recommendations  Home health PT     Assistance Recommended at Discharge PRN  Patient can return home with the following Help with stairs or ramp for entrance;A little help with bathing/dressing/bathroom;A little help with walking and/or transfers   Equipment Recommendations  None recommended by PT    Recommendations for Other Services       Precautions / Restrictions  Precautions Precautions: Fall Restrictions Weight Bearing Restrictions: No     Mobility  Bed Mobility Overal bed mobility: Modified Independent                  Transfers Overall transfer level: Modified independent                      Ambulation/Gait Ambulation/Gait assistance: Supervision;Modified independent (Device/Increase time) Gait Distance (Feet): 120 Feet Assistive device: Rolling walker (2 wheels) Gait Pattern/deviations: Decreased step length - right;Decreased step length - left;Decreased stride length Gait velocity: decreased     General Gait Details: demonstrates increased endurance/distance for gait training with slightly labored cadence with tendency to lean on nearby objects for support when not using an AD, safer using RW with no loss of balance   Stairs             Wheelchair Mobility    Modified Rankin (Stroke Patients Only)       Balance Overall balance assessment: Needs assistance Sitting-balance support: Feet supported;No upper extremity supported Sitting balance-Leahy Scale: Good Sitting balance - Comments: seated at EOB   Standing balance support: During functional activity;No upper extremity supported Standing balance-Leahy Scale: Poor Standing balance comment: fair/poor without AD, fair/good using RW                            Cognition Arousal/Alertness: Awake/alert Behavior During Therapy: WFL for tasks assessed/performed Overall Cognitive Status: Within Functional Limits for tasks assessed  Exercises      General Comments        Pertinent Vitals/Pain Pain Assessment: No/denies pain    Home Living                          Prior Function            PT Goals (current goals can now be found in the care plan section) Acute Rehab PT Goals Patient Stated Goal: Return home PT Goal Formulation: With patient Time For Goal  Achievement: 09/10/21 Potential to Achieve Goals: Good Progress towards PT goals: Progressing toward goals    Frequency    Min 3X/week      PT Plan Current plan remains appropriate    Co-evaluation              AM-PAC PT "6 Clicks" Mobility   Outcome Measure  Help needed turning from your back to your side while in a flat bed without using bedrails?: None Help needed moving from lying on your back to sitting on the side of a flat bed without using bedrails?: A Little Help needed moving to and from a bed to a chair (including a wheelchair)?: A Little Help needed standing up from a chair using your arms (e.g., wheelchair or bedside chair)?: A Little Help needed to walk in hospital room?: A Little Help needed climbing 3-5 steps with a railing? : A Little 6 Click Score: 19    End of Session   Activity Tolerance: Patient tolerated treatment well Patient left: in chair;with call bell/phone within reach Nurse Communication: Mobility status PT Visit Diagnosis: Muscle weakness (generalized) (M62.81);Other abnormalities of gait and mobility (R26.89)     Time: 5621-3086 PT Time Calculation (min) (ACUTE ONLY): 20 min  Charges:  $Gait Training: 8-22 mins $Therapeutic Activity: 8-22 mins                     11:58 AM, 08/28/21 Lonell Grandchild, MPT Physical Therapist with [facility] [phone] office [phone] mobile phone

## 2021-08-28 NOTE — TOC Transition Note (Signed)
Transition of Care [facility]) - CM/SW Discharge Note   Patient Details  Name: Brittney Williams MRN: [id number] Date of Birth: July 22, 1946  [facility]) CM/SW Contact:  [name], LCSW Phone Number: 08/28/2021, 3:01 PM   Clinical Narrative:     Pt medically stable for dc home today per MD. PT recommending HH at dc. Spoke with pt and her daughter to review dc planning. Pt agreeable to [facility]. CMS provider options reviewed. Referred to [facility] and pt accepted for care at home. Information added to AVS.   Information about Meals on Wheels provided verbally and in writing.   Daughter works until [employer] and they will stop at pt's home to get her some clothes and then she will be here to pick her up.   Updated RN. No other TOC needs for dc.  Final next level of care: [place] Barriers to Discharge: Barriers Resolved   Patient Goals and CMS Choice Patient states their goals for this hospitalization and ongoing recovery are:: To return home with Home Health Service and Meals on Wheels CMS Medicare.gov Compare Post Acute Care list provided to:: Patient Represenative (must comment) Choice offered to / list presented to : Patient, Adult Children  Discharge Placement                       Discharge Plan and Services   Discharge Planning Services: Mobile Meals Post Acute Care Choice: Home Health                    HH Arranged: Social Work, PT, OT, Therapist, sports (Pending, will have TOC to make arrangements.) HH Agency: [place] Date [place]: 08/28/21   Representative spoke with at [place]: [place] ([place]) Interventions     Readmission Risk Interventions No flowsheet data found.

## 2021-08-29 LAB — URINE CULTURE: Culture: >=100000 — AB

## 2021-08-30 DIAGNOSIS — R69 Illness, unspecified: Secondary | ICD-10-CM | POA: Diagnosis not present

## 2021-08-30 DIAGNOSIS — I5043 Acute on chronic combined systolic (congestive) and diastolic (congestive) heart failure: Secondary | ICD-10-CM | POA: Diagnosis not present

## 2021-08-30 DIAGNOSIS — E039 Hypothyroidism, unspecified: Secondary | ICD-10-CM | POA: Diagnosis not present

## 2021-08-30 DIAGNOSIS — N390 Urinary tract infection, site not specified: Secondary | ICD-10-CM | POA: Diagnosis not present

## 2021-08-30 DIAGNOSIS — K219 Gastro-esophageal reflux disease without esophagitis: Secondary | ICD-10-CM | POA: Diagnosis not present

## 2021-08-30 DIAGNOSIS — I420 Dilated cardiomyopathy: Secondary | ICD-10-CM | POA: Diagnosis not present

## 2021-08-30 DIAGNOSIS — E785 Hyperlipidemia, unspecified: Secondary | ICD-10-CM | POA: Diagnosis not present

## 2021-08-30 DIAGNOSIS — R627 Adult failure to thrive: Secondary | ICD-10-CM | POA: Diagnosis not present

## 2021-08-30 DIAGNOSIS — I110 Hypertensive heart disease with heart failure: Secondary | ICD-10-CM | POA: Diagnosis not present

## 2021-08-31 ENCOUNTER — Encounter (HOSPITAL_COMMUNITY): Payer: Self-pay | Admitting: *Deleted

## 2021-08-31 ENCOUNTER — Emergency Department (HOSPITAL_COMMUNITY)
Admission: EM | Admit: 2021-08-31 | Discharge: 2021-08-31 | Disposition: A | Discharge status: Home / Self Care | Payer: Medicare HMO | Attending: Emergency Medicine | Admitting: Emergency Medicine

## 2021-08-31 ENCOUNTER — Emergency Department (HOSPITAL_COMMUNITY): Payer: Medicare HMO

## 2021-08-31 DIAGNOSIS — J690 Pneumonitis due to inhalation of food and vomit: Secondary | ICD-10-CM | POA: Diagnosis not present

## 2021-08-31 DIAGNOSIS — J9811 Atelectasis: Secondary | ICD-10-CM | POA: Insufficient documentation

## 2021-08-31 DIAGNOSIS — I951 Orthostatic hypotension: Secondary | ICD-10-CM | POA: Diagnosis not present

## 2021-08-31 DIAGNOSIS — X58XXXA Exposure to other specified factors, initial encounter: Secondary | ICD-10-CM | POA: Diagnosis not present

## 2021-08-31 DIAGNOSIS — T17908A Unspecified foreign body in respiratory tract, part unspecified causing other injury, initial encounter: Secondary | ICD-10-CM

## 2021-08-31 DIAGNOSIS — T17228A Food in pharynx causing other injury, initial encounter: Secondary | ICD-10-CM | POA: Diagnosis not present

## 2021-08-31 DIAGNOSIS — Z79899 Other long term (current) drug therapy: Secondary | ICD-10-CM | POA: Diagnosis not present

## 2021-08-31 DIAGNOSIS — R55 Syncope and collapse: Secondary | ICD-10-CM | POA: Diagnosis not present

## 2021-08-31 DIAGNOSIS — E860 Dehydration: Secondary | ICD-10-CM | POA: Diagnosis not present

## 2021-08-31 NOTE — ED Triage Notes (Signed)
Eating a bologna sandwich around 2pm and has been spitting ever since.

## 2021-08-31 NOTE — Discharge Instructions (Addendum)
Your testing here showed no signs of pneumonia, sometimes when you aspirate you cough for a while afterwards, please drink plenty of clear liquids, eat normal food, chew your food well and make sure that you are swallowing with attention so that this does not happen again.  Return to the emergency department for severe or worsening symptoms  This will gradually improve over the next day or 2.

## 2021-08-31 NOTE — ED Provider Notes (Signed)
[facility] EMERGENCY DEPARTMENT Provider Note   CSN: [id number] Arrival date & time: 08/31/21  1739     History  No chief complaint on file.   Brittney Williams is a 76 y.o. female.  HPI  This patient is a 76 year old female, she was recently admitted to the hospital due to some degree of failure to thrive and dehydration.  She presents back to the hospital today after being discharged a couple of days ago because of having an aspiration event.  The patient was eating a bologna sandwich when she started to cough, felt like the bologna was lodged in her throat, she coughed it up and then continued to have more coughing.  The daughter brings her in because of this coughing and shortness of breath.  This seems to have gradually improved, there was no associated syncope.  The patient was coughing so hard that she actually had a coughing associated urinary incontinence.  She denies any abdominal pain, nausea, vomiting, diarrhea and does not have any chest pain.  She has been able to tolerate secretions and swallow without difficulty since this event  Home Medications Prior to Admission medications   Medication Sig Start Date End Date Taking? Authorizing Provider  acetaminophen (TYLENOL) 325 MG tablet Take 2 tablets (650 mg total) by mouth every 6 (six) hours as needed for mild pain, moderate pain or fever. 08/28/21   Roxan Hockey, MD  atorvastatin (LIPITOR) 40 MG tablet Take 40 mg by mouth daily. 01/10/19   [provider]  calcium carbonate (TUMS - DOSED IN MG ELEMENTAL CALCIUM) 500 MG chewable tablet Chew 3 tablets by mouth daily.    [provider]  carvedilol (COREG) 12.5 MG tablet Take 1 tablet (12.5 mg total) by mouth 2 (two) times daily with a meal. 08/28/21   Emokpae, Courage, MD  cefdinir (OMNICEF) 300 MG capsule Take 1 capsule (300 mg total) by mouth 2 (two) times daily for 5 days. 08/28/21 09/02/21  Roxan Hockey, MD  dimenhyDRINATE (DRAMAMINE) 50 MG tablet Take  50 mg by mouth every 8 (eight) hours as needed for dizziness.    [provider]  donepezil (ARICEPT) 10 MG tablet Take 1 tablet (10 mg total) by mouth every evening. 08/28/21   Roxan Hockey, MD  FLUoxetine (PROZAC) 40 MG capsule Take 1 capsule (40 mg total) by mouth daily. 08/28/21   Roxan Hockey, MD  hydrALAZINE (APRESOLINE) 25 MG tablet Take 1 tablet (25 mg total) by mouth 3 (three) times daily. 08/28/21 08/28/22  Roxan Hockey, MD  isosorbide mononitrate (IMDUR) 30 MG 24 hr tablet Take 1 tablet (30 mg total) by mouth daily. 08/28/21 08/28/22  Roxan Hockey, MD  levothyroxine (SYNTHROID) 25 MCG tablet Take 1 tablet (25 mcg total) by mouth daily before breakfast. 08/28/21   Emokpae, Courage, MD  lisinopril (ZESTRIL) 40 MG tablet Take 1 tablet (40 mg total) by mouth daily. 08/28/21   Roxan Hockey, MD  Multiple Vitamin (MULTIVITAMIN WITH MINERALS) TABS tablet Take 1 tablet by mouth daily.    [provider]  oxybutynin (DITROPAN) 5 MG tablet Take 1 tablet (5 mg total) by mouth 2 (two) times daily. 08/28/21   Roxan Hockey, MD  spironolactone (ALDACTONE) 25 MG tablet Take 1 tablet (25 mg total) by mouth daily. 08/28/21   Roxan Hockey, MD      Allergies    Bee venom, Codeine, and Hydrocodone    Review of Systems   Review of Systems  All other systems reviewed and are negative.  Physical Exam Updated Vital Signs BP (!) 177/81 (BP Location: Right Arm)    Pulse 82    Temp 98.5 F (36.9 C) (Oral)    Resp 14    SpO2 99%  Physical Exam Vitals and nursing note reviewed.  Constitutional:      General: She is not in acute distress.    Appearance: She is well-developed.  HENT:     Head: Normocephalic and atraumatic.     Mouth/Throat:     Pharynx: No oropharyngeal exudate.  Eyes:     General: No scleral icterus.       Right eye: No discharge.        Left eye: No discharge.     Conjunctiva/sclera: Conjunctivae normal.     Pupils: Pupils are equal, round, and  reactive to light.  Neck:     Thyroid: No thyromegaly.     Vascular: No JVD.  Cardiovascular:     Rate and Rhythm: Normal rate and regular rhythm.     Heart sounds: Normal heart sounds. No murmur heard.   No friction rub. No gallop.  Pulmonary:     Effort: Pulmonary effort is normal. No respiratory distress.     Breath sounds: Normal breath sounds. No wheezing or rales.  Abdominal:     General: Bowel sounds are normal. There is no distension.     Palpations: Abdomen is soft. There is no mass.     Tenderness: There is no abdominal tenderness.  Musculoskeletal:        General: No tenderness. Normal range of motion.     Cervical back: Normal range of motion and neck supple.  Lymphadenopathy:     Cervical: No cervical adenopathy.  Skin:    General: Skin is warm and dry.     Findings: No erythema or rash.  Neurological:     Mental Status: She is alert.     Coordination: Coordination normal.  Psychiatric:        Behavior: Behavior normal.    ED Results / Procedures / Treatments   Labs (all labs ordered are listed, but only abnormal results are displayed) Labs Reviewed - No data to display  EKG None  Radiology DG Chest 2 View  Result Date: 08/31/2021 CLINICAL DATA:  Choked while eating today.  Possible aspiration. EXAM: CHEST - 2 VIEW COMPARISON:  Chest x-ray 08/26/2021 FINDINGS: The cardiac silhouette, mediastinal and hilar contours are normal. Streaky right basilar atelectasis. No definite infiltrates or effusions. No pulmonary lesions. No pneumothorax. The bony thorax is intact. IMPRESSION: Streaky right basilar atelectasis but no definite infiltrates or effusions. Electronically Signed   By: Marijo Sanes M.D.   On: 08/31/2021 18:41    Procedures Procedures    Medications Ordered in ED Medications - No data to display  ED Course/ Medical Decision Making/ A&P                           Medical Decision Making The patient lungs are actually very clear, her vital signs  show no hypoxia or tachycardia, she is not febrile or hypotensive.  She is ambulatory with minimal assistance, the daughter who is allowing the patient to live by herself after being discharged from the hospital has been checking on her frequently.  The patient appears well but will need a chest x-ray to make sure there is no signs of pulmonary abnormality as well as a p.o. trial to make sure this is not a esophageal  food impaction  Amount and/or Complexity of Data Reviewed Independent Historian: caregiver    Details: Endorses seeing the patient with excessive coughing prehospital External Data Reviewed: labs, radiology, ECG and notes.    Details: I reviewed the details from her recent admission to the hospital as well as discharge summary Radiology: ordered and independent interpretation performed.    Details: No findings of acute pneumonia mild atelectasis seen, likely irrelevant Discussion of management or test interpretation with external provider(s): Patient is well-appearing, considered further testing or admission to the hospital but she is not hypoxic or tachycardic and does not need a CT scan at this time.  Risk Risk Details: Stable for discharge in the care of her daughter who is with her today.  They are all in agreement with the plan after being given instructions follow-up care and results           Final Clinical Impression(s) / ED Diagnoses Final diagnoses:  Aspiration into airway, initial encounter     Noemi Chapel, MD 08/31/21 1921

## 2021-09-01 DIAGNOSIS — R627 Adult failure to thrive: Secondary | ICD-10-CM | POA: Diagnosis not present

## 2021-09-01 DIAGNOSIS — I110 Hypertensive heart disease with heart failure: Secondary | ICD-10-CM | POA: Diagnosis not present

## 2021-09-01 DIAGNOSIS — E039 Hypothyroidism, unspecified: Secondary | ICD-10-CM | POA: Diagnosis not present

## 2021-09-01 DIAGNOSIS — I10 Essential (primary) hypertension: Secondary | ICD-10-CM | POA: Diagnosis not present

## 2021-09-01 DIAGNOSIS — I5043 Acute on chronic combined systolic (congestive) and diastolic (congestive) heart failure: Secondary | ICD-10-CM | POA: Diagnosis not present

## 2021-09-01 DIAGNOSIS — E785 Hyperlipidemia, unspecified: Secondary | ICD-10-CM | POA: Diagnosis not present

## 2021-09-01 DIAGNOSIS — R69 Illness, unspecified: Secondary | ICD-10-CM | POA: Diagnosis not present

## 2021-09-01 DIAGNOSIS — I420 Dilated cardiomyopathy: Secondary | ICD-10-CM | POA: Diagnosis not present

## 2021-09-01 DIAGNOSIS — K219 Gastro-esophageal reflux disease without esophagitis: Secondary | ICD-10-CM | POA: Diagnosis not present

## 2021-09-01 DIAGNOSIS — N390 Urinary tract infection, site not specified: Secondary | ICD-10-CM | POA: Diagnosis not present

## 2021-09-04 DIAGNOSIS — E039 Hypothyroidism, unspecified: Secondary | ICD-10-CM | POA: Diagnosis not present

## 2021-09-04 DIAGNOSIS — I5043 Acute on chronic combined systolic (congestive) and diastolic (congestive) heart failure: Secondary | ICD-10-CM | POA: Diagnosis not present

## 2021-09-04 DIAGNOSIS — K219 Gastro-esophageal reflux disease without esophagitis: Secondary | ICD-10-CM | POA: Diagnosis not present

## 2021-09-04 DIAGNOSIS — I420 Dilated cardiomyopathy: Secondary | ICD-10-CM | POA: Diagnosis not present

## 2021-09-04 DIAGNOSIS — R69 Illness, unspecified: Secondary | ICD-10-CM | POA: Diagnosis not present

## 2021-09-04 DIAGNOSIS — I110 Hypertensive heart disease with heart failure: Secondary | ICD-10-CM | POA: Diagnosis not present

## 2021-09-04 DIAGNOSIS — R627 Adult failure to thrive: Secondary | ICD-10-CM | POA: Diagnosis not present

## 2021-09-04 DIAGNOSIS — E785 Hyperlipidemia, unspecified: Secondary | ICD-10-CM | POA: Diagnosis not present

## 2021-09-04 DIAGNOSIS — N390 Urinary tract infection, site not specified: Secondary | ICD-10-CM | POA: Diagnosis not present

## 2021-09-05 DIAGNOSIS — I110 Hypertensive heart disease with heart failure: Secondary | ICD-10-CM | POA: Diagnosis not present

## 2021-09-05 DIAGNOSIS — N390 Urinary tract infection, site not specified: Secondary | ICD-10-CM | POA: Diagnosis not present

## 2021-09-05 DIAGNOSIS — E785 Hyperlipidemia, unspecified: Secondary | ICD-10-CM | POA: Diagnosis not present

## 2021-09-05 DIAGNOSIS — R69 Illness, unspecified: Secondary | ICD-10-CM | POA: Diagnosis not present

## 2021-09-05 DIAGNOSIS — I5043 Acute on chronic combined systolic (congestive) and diastolic (congestive) heart failure: Secondary | ICD-10-CM | POA: Diagnosis not present

## 2021-09-05 DIAGNOSIS — K219 Gastro-esophageal reflux disease without esophagitis: Secondary | ICD-10-CM | POA: Diagnosis not present

## 2021-09-05 DIAGNOSIS — R627 Adult failure to thrive: Secondary | ICD-10-CM | POA: Diagnosis not present

## 2021-09-05 DIAGNOSIS — I420 Dilated cardiomyopathy: Secondary | ICD-10-CM | POA: Diagnosis not present

## 2021-09-05 DIAGNOSIS — E039 Hypothyroidism, unspecified: Secondary | ICD-10-CM | POA: Diagnosis not present

## 2021-09-06 DIAGNOSIS — E559 Vitamin D deficiency, unspecified: Secondary | ICD-10-CM | POA: Diagnosis not present

## 2021-09-06 DIAGNOSIS — I10 Essential (primary) hypertension: Secondary | ICD-10-CM | POA: Diagnosis not present

## 2021-09-06 DIAGNOSIS — E538 Deficiency of other specified B group vitamins: Secondary | ICD-10-CM | POA: Diagnosis not present

## 2021-09-06 DIAGNOSIS — E663 Overweight: Secondary | ICD-10-CM | POA: Diagnosis not present

## 2021-09-06 DIAGNOSIS — K219 Gastro-esophageal reflux disease without esophagitis: Secondary | ICD-10-CM | POA: Diagnosis not present

## 2021-09-06 DIAGNOSIS — R5381 Other malaise: Secondary | ICD-10-CM | POA: Diagnosis not present

## 2021-09-06 DIAGNOSIS — Z1331 Encounter for screening for depression: Secondary | ICD-10-CM | POA: Diagnosis not present

## 2021-09-06 DIAGNOSIS — I5022 Chronic systolic (congestive) heart failure: Secondary | ICD-10-CM | POA: Diagnosis not present

## 2021-09-06 DIAGNOSIS — Z6825 Body mass index (BMI) 25.0-25.9, adult: Secondary | ICD-10-CM | POA: Diagnosis not present

## 2021-09-07 DIAGNOSIS — N390 Urinary tract infection, site not specified: Secondary | ICD-10-CM | POA: Diagnosis not present

## 2021-09-07 DIAGNOSIS — K219 Gastro-esophageal reflux disease without esophagitis: Secondary | ICD-10-CM | POA: Diagnosis not present

## 2021-09-07 DIAGNOSIS — I5043 Acute on chronic combined systolic (congestive) and diastolic (congestive) heart failure: Secondary | ICD-10-CM | POA: Diagnosis not present

## 2021-09-07 DIAGNOSIS — E039 Hypothyroidism, unspecified: Secondary | ICD-10-CM | POA: Diagnosis not present

## 2021-09-07 DIAGNOSIS — I110 Hypertensive heart disease with heart failure: Secondary | ICD-10-CM | POA: Diagnosis not present

## 2021-09-07 DIAGNOSIS — R69 Illness, unspecified: Secondary | ICD-10-CM | POA: Diagnosis not present

## 2021-09-07 DIAGNOSIS — E785 Hyperlipidemia, unspecified: Secondary | ICD-10-CM | POA: Diagnosis not present

## 2021-09-07 DIAGNOSIS — I420 Dilated cardiomyopathy: Secondary | ICD-10-CM | POA: Diagnosis not present

## 2021-09-07 DIAGNOSIS — R627 Adult failure to thrive: Secondary | ICD-10-CM | POA: Diagnosis not present

## 2021-09-08 DIAGNOSIS — R627 Adult failure to thrive: Secondary | ICD-10-CM | POA: Diagnosis not present

## 2021-09-08 DIAGNOSIS — E785 Hyperlipidemia, unspecified: Secondary | ICD-10-CM | POA: Diagnosis not present

## 2021-09-08 DIAGNOSIS — K219 Gastro-esophageal reflux disease without esophagitis: Secondary | ICD-10-CM | POA: Diagnosis not present

## 2021-09-08 DIAGNOSIS — I5043 Acute on chronic combined systolic (congestive) and diastolic (congestive) heart failure: Secondary | ICD-10-CM | POA: Diagnosis not present

## 2021-09-08 DIAGNOSIS — E039 Hypothyroidism, unspecified: Secondary | ICD-10-CM | POA: Diagnosis not present

## 2021-09-08 DIAGNOSIS — I420 Dilated cardiomyopathy: Secondary | ICD-10-CM | POA: Diagnosis not present

## 2021-09-08 DIAGNOSIS — I110 Hypertensive heart disease with heart failure: Secondary | ICD-10-CM | POA: Diagnosis not present

## 2021-09-08 DIAGNOSIS — R69 Illness, unspecified: Secondary | ICD-10-CM | POA: Diagnosis not present

## 2021-09-08 DIAGNOSIS — N390 Urinary tract infection, site not specified: Secondary | ICD-10-CM | POA: Diagnosis not present

## 2021-09-12 DIAGNOSIS — E782 Mixed hyperlipidemia: Secondary | ICD-10-CM | POA: Diagnosis not present

## 2021-09-12 DIAGNOSIS — I110 Hypertensive heart disease with heart failure: Secondary | ICD-10-CM | POA: Diagnosis not present

## 2021-09-12 DIAGNOSIS — I5022 Chronic systolic (congestive) heart failure: Secondary | ICD-10-CM | POA: Diagnosis not present

## 2021-09-13 ENCOUNTER — Emergency Department (HOSPITAL_COMMUNITY): Payer: Medicare HMO

## 2021-09-13 ENCOUNTER — Encounter (HOSPITAL_COMMUNITY): Payer: Self-pay | Admitting: Emergency Medicine

## 2021-09-13 ENCOUNTER — Other Ambulatory Visit: Payer: Self-pay

## 2021-09-13 ENCOUNTER — Inpatient Hospital Stay (HOSPITAL_COMMUNITY)
Admission: EM | Admit: 2021-09-13 | Discharge: 2021-09-15 | DRG: 948 | Disposition: A | Discharge status: Home Health | Payer: Medicare HMO | Attending: Family Medicine | Admitting: Family Medicine

## 2021-09-13 DIAGNOSIS — Z9049 Acquired absence of other specified parts of digestive tract: Secondary | ICD-10-CM

## 2021-09-13 DIAGNOSIS — E785 Hyperlipidemia, unspecified: Secondary | ICD-10-CM | POA: Diagnosis present

## 2021-09-13 DIAGNOSIS — R42 Dizziness and giddiness: Secondary | ICD-10-CM | POA: Diagnosis present

## 2021-09-13 DIAGNOSIS — R69 Illness, unspecified: Secondary | ICD-10-CM | POA: Diagnosis not present

## 2021-09-13 DIAGNOSIS — Z91030 Bee allergy status: Secondary | ICD-10-CM

## 2021-09-13 DIAGNOSIS — Z885 Allergy status to narcotic agent status: Secondary | ICD-10-CM | POA: Diagnosis not present

## 2021-09-13 DIAGNOSIS — R32 Unspecified urinary incontinence: Secondary | ICD-10-CM | POA: Diagnosis present

## 2021-09-13 DIAGNOSIS — I5043 Acute on chronic combined systolic (congestive) and diastolic (congestive) heart failure: Secondary | ICD-10-CM | POA: Diagnosis not present

## 2021-09-13 DIAGNOSIS — Z20822 Contact with and (suspected) exposure to covid-19: Secondary | ICD-10-CM | POA: Diagnosis present

## 2021-09-13 DIAGNOSIS — I5022 Chronic systolic (congestive) heart failure: Secondary | ICD-10-CM | POA: Diagnosis present

## 2021-09-13 DIAGNOSIS — F32A Depression, unspecified: Secondary | ICD-10-CM | POA: Diagnosis present

## 2021-09-13 DIAGNOSIS — R0689 Other abnormalities of breathing: Secondary | ICD-10-CM | POA: Diagnosis not present

## 2021-09-13 DIAGNOSIS — Z90710 Acquired absence of both cervix and uterus: Secondary | ICD-10-CM

## 2021-09-13 DIAGNOSIS — R6889 Other general symptoms and signs: Secondary | ICD-10-CM | POA: Diagnosis not present

## 2021-09-13 DIAGNOSIS — M1990 Unspecified osteoarthritis, unspecified site: Secondary | ICD-10-CM | POA: Diagnosis present

## 2021-09-13 DIAGNOSIS — R531 Weakness: Secondary | ICD-10-CM | POA: Diagnosis not present

## 2021-09-13 DIAGNOSIS — F0390 Unspecified dementia without behavioral disturbance: Secondary | ICD-10-CM | POA: Diagnosis present

## 2021-09-13 DIAGNOSIS — E7849 Other hyperlipidemia: Secondary | ICD-10-CM | POA: Diagnosis not present

## 2021-09-13 DIAGNOSIS — I10 Essential (primary) hypertension: Secondary | ICD-10-CM | POA: Diagnosis not present

## 2021-09-13 DIAGNOSIS — E039 Hypothyroidism, unspecified: Secondary | ICD-10-CM | POA: Diagnosis not present

## 2021-09-13 DIAGNOSIS — K219 Gastro-esophageal reflux disease without esophagitis: Secondary | ICD-10-CM | POA: Diagnosis present

## 2021-09-13 DIAGNOSIS — R627 Adult failure to thrive: Secondary | ICD-10-CM | POA: Diagnosis not present

## 2021-09-13 DIAGNOSIS — Z743 Need for continuous supervision: Secondary | ICD-10-CM | POA: Diagnosis not present

## 2021-09-13 DIAGNOSIS — R159 Full incontinence of feces: Secondary | ICD-10-CM | POA: Diagnosis present

## 2021-09-13 DIAGNOSIS — N390 Urinary tract infection, site not specified: Secondary | ICD-10-CM | POA: Diagnosis not present

## 2021-09-13 DIAGNOSIS — I420 Dilated cardiomyopathy: Secondary | ICD-10-CM | POA: Diagnosis not present

## 2021-09-13 DIAGNOSIS — F419 Anxiety disorder, unspecified: Secondary | ICD-10-CM | POA: Diagnosis present

## 2021-09-13 DIAGNOSIS — I110 Hypertensive heart disease with heart failure: Secondary | ICD-10-CM | POA: Diagnosis not present

## 2021-09-13 DIAGNOSIS — Z79890 Hormone replacement therapy (postmenopausal): Secondary | ICD-10-CM | POA: Diagnosis not present

## 2021-09-13 DIAGNOSIS — Z6824 Body mass index (BMI) 24.0-24.9, adult: Secondary | ICD-10-CM | POA: Diagnosis not present

## 2021-09-13 DIAGNOSIS — E440 Moderate protein-calorie malnutrition: Secondary | ICD-10-CM | POA: Diagnosis present

## 2021-09-13 DIAGNOSIS — R4182 Altered mental status, unspecified: Secondary | ICD-10-CM | POA: Diagnosis present

## 2021-09-13 DIAGNOSIS — R404 Transient alteration of awareness: Secondary | ICD-10-CM | POA: Diagnosis not present

## 2021-09-13 DIAGNOSIS — R0602 Shortness of breath: Secondary | ICD-10-CM | POA: Diagnosis not present

## 2021-09-13 DIAGNOSIS — Z79899 Other long term (current) drug therapy: Secondary | ICD-10-CM | POA: Diagnosis not present

## 2021-09-13 DIAGNOSIS — I5042 Chronic combined systolic (congestive) and diastolic (congestive) heart failure: Secondary | ICD-10-CM | POA: Diagnosis present

## 2021-09-13 DIAGNOSIS — R197 Diarrhea, unspecified: Secondary | ICD-10-CM | POA: Diagnosis not present

## 2021-09-13 LAB — COMPREHENSIVE METABOLIC PANEL
ALT: 14 U/L (ref 0–44)
AST: 15 U/L (ref 15–41)
Albumin: 3 g/dL — ABNORMAL LOW (ref 3.5–5.0)
Alkaline Phosphatase: 72 U/L (ref 38–126)
Anion gap: 8 (ref 5–15)
BUN: 19 mg/dL (ref 8–23)
CO2: 22 mmol/L (ref 22–32)
Calcium: 8.4 mg/dL — ABNORMAL LOW (ref 8.9–10.3)
Chloride: 107 mmol/L (ref 98–111)
Creatinine, Ser: 1.19 mg/dL — ABNORMAL HIGH (ref 0.44–1.00)
GFR, Estimated: 48 mL/min — ABNORMAL LOW (ref >60)
Glucose, Bld: 99 mg/dL (ref 70–99)
Potassium: 3.6 mmol/L (ref 3.5–5.1)
Sodium: 137 mmol/L (ref 135–145)
Total Bilirubin: 0.6 mg/dL (ref 0.3–1.2)
Total Protein: 6.2 g/dL — ABNORMAL LOW (ref 6.5–8.1)

## 2021-09-13 LAB — CBC WITH DIFFERENTIAL/PLATELET
Abs Immature Granulocytes: 0.02 10*3/uL (ref 0.00–0.07)
Basophils Absolute: 0 10*3/uL (ref 0.0–0.1)
Basophils Relative: 0 %
Eosinophils Absolute: 0 10*3/uL (ref 0.0–0.5)
Eosinophils Relative: 0 %
HCT: 38.2 % (ref 36.0–46.0)
Hemoglobin: 12 g/dL (ref 12.0–15.0)
Immature Granulocytes: 0 %
Lymphocytes Relative: 24 %
Lymphs Abs: 1.3 10*3/uL (ref 0.7–4.0)
MCH: 29.6 pg (ref 26.0–34.0)
MCHC: 31.4 g/dL (ref 30.0–36.0)
MCV: 94.3 fL (ref 80.0–100.0)
Monocytes Absolute: 0.3 10*3/uL (ref 0.1–1.0)
Monocytes Relative: 6 %
Neutro Abs: 3.8 10*3/uL (ref 1.7–7.7)
Neutrophils Relative %: 70 %
Platelets: 235 10*3/uL (ref 150–400)
RBC: 4.05 MIL/uL (ref 3.87–5.11)
RDW: 14.5 % (ref 11.5–15.5)
WBC: 5.5 10*3/uL (ref 4.0–10.5)
nRBC: 0 % (ref 0.0–0.2)

## 2021-09-13 LAB — URINALYSIS, ROUTINE W REFLEX MICROSCOPIC
Bilirubin Urine: NEGATIVE
Glucose, UA: NEGATIVE mg/dL
Hgb urine dipstick: NEGATIVE
Ketones, ur: NEGATIVE mg/dL
Leukocytes,Ua: NEGATIVE
Nitrite: NEGATIVE
Protein, ur: NEGATIVE mg/dL
Specific Gravity, Urine: <1.005 — ABNORMAL LOW (ref 1.005–1.030)
pH: 6.5 (ref 5.0–8.0)

## 2021-09-13 LAB — RESP PANEL BY RT-PCR (FLU A&B, COVID) ARPGX2
Influenza A by PCR: NEGATIVE
Influenza B by PCR: NEGATIVE
SARS Coronavirus 2 by RT PCR: NEGATIVE

## 2021-09-13 LAB — PROCALCITONIN: Procalcitonin: <0.1 ng/mL

## 2021-09-13 MED ORDER — SODIUM CHLORIDE 0.9 % IV BOLUS
500.0000 mL | Freq: Once | INTRAVENOUS | Status: AC
  Administered 2021-09-13: 500 mL via INTRAVENOUS

## 2021-09-13 MED ORDER — HYDRALAZINE HCL 25 MG PO TABS
25.0000 mg | ORAL_TABLET | Freq: Once | ORAL | Status: AC
  Administered 2021-09-13: 25 mg via ORAL
  Filled 2021-09-13: qty 1

## 2021-09-13 MED ORDER — CARVEDILOL 12.5 MG PO TABS
12.5000 mg | ORAL_TABLET | Freq: Once | ORAL | Status: AC
  Administered 2021-09-13: 12.5 mg via ORAL
  Filled 2021-09-13: qty 1

## 2021-09-13 NOTE — ED Notes (Signed)
Patient transported to CT 

## 2021-09-13 NOTE — ED Provider Notes (Signed)
[facility] EMERGENCY DEPARTMENT Provider Note   CSN: [id number] Arrival date & time: 09/13/21  1337     History  Chief Complaint  Patient presents with   Dizziness    Brittney Williams is a 76 y.o. female with a history including hypertension, hyperlipidemia, GERD, history of recent UTI, CHF presenting for evaluation of complaints of dizziness and low blood pressure.  She was seen by her home physical therapist this morning at her home who noted she had a low blood pressure, the numbers are unknown, however PCP was contacted and recommended she be transferred to the emergency department.  She endorses generalized lightheadedness and has had difficulty ambulating for the past 2 days.  She also states she has had diarrhea for 2 days, she denies blood in her stools, she reports having about 6 episodes of watery bowel movements in the past 24 hours.  She has been unable to ambulate without assistance.   11:15 PM Daughter and son are currently at the bedside who offer additional information, stating that their mother has been having intermittent episodes of significant confusion and has been incontinent of stool and urine for the past week and family have had difficulty getting her out of bed.  She has also been sleeping a lot more than normal.  Also endorsing increased sob with minimal exertion.   Of note, pts husband is currently in rehab therefore patient is living alone.  Daughter checks on her every evening and they also have a caregiver that spends 3 hours during the day with her to make sure she eats but she is alone the rest of the time. When the daughter arrives the patient is usually in bed, stool and urine soaked which is very abnormal behavior for her.   The history is provided by the patient.      Home Medications Prior to Admission medications   Medication Sig Start Date End Date Taking? Authorizing Provider  acetaminophen (TYLENOL) 325 MG tablet Take 2 tablets (650 mg total) by  mouth every 6 (six) hours as needed for mild pain, moderate pain or fever. 08/28/21  Yes Emokpae, Courage, MD  atorvastatin (LIPITOR) 40 MG tablet Take 40 mg by mouth daily. 01/10/19  Yes [provider]  carvedilol (COREG) 12.5 MG tablet Take 1 tablet (12.5 mg total) by mouth 2 (two) times daily with a meal. 08/28/21  Yes Emokpae, Courage, MD  dimenhyDRINATE (DRAMAMINE) 50 MG tablet Take 50 mg by mouth every 8 (eight) hours as needed for dizziness.   Yes [provider]  donepezil (ARICEPT) 10 MG tablet Take 1 tablet (10 mg total) by mouth every evening. 08/28/21  Yes Emokpae, Courage, MD  escitalopram (LEXAPRO) 10 MG tablet Take 10 mg by mouth daily. 09/06/21  Yes [provider]  FLUoxetine (PROZAC) 40 MG capsule Take 1 capsule (40 mg total) by mouth daily. 08/28/21  Yes Emokpae, Courage, MD  hydrALAZINE (APRESOLINE) 25 MG tablet Take 1 tablet (25 mg total) by mouth 3 (three) times daily. 08/28/21 08/28/22 Yes Emokpae, Courage, MD  isosorbide mononitrate (IMDUR) 30 MG 24 hr tablet Take 1 tablet (30 mg total) by mouth daily. 08/28/21 08/28/22 Yes Roxan Hockey, MD  levothyroxine (SYNTHROID) 25 MCG tablet Take 1 tablet (25 mcg total) by mouth daily before breakfast. 08/28/21  Yes Emokpae, Courage, MD  lisinopril (ZESTRIL) 40 MG tablet Take 1 tablet (40 mg total) by mouth daily. 08/28/21  Yes Roxan Hockey, MD  Multiple Vitamin (MULTIVITAMIN WITH MINERALS) TABS tablet Take 1  tablet by mouth daily.   Yes [provider]  oxybutynin (DITROPAN) 5 MG tablet Take 1 tablet (5 mg total) by mouth 2 (two) times daily. 08/28/21  Yes Emokpae, Courage, MD  spironolactone (ALDACTONE) 25 MG tablet Take 1 tablet (25 mg total) by mouth daily. 08/28/21  Yes Emokpae, Courage, MD  Vitamin D, Ergocalciferol, (DRISDOL) 1.25 MG (50000 UNIT) CAPS capsule Take 50,000 Units by mouth See admin instructions. Tuesdays 09/07/21  Yes [provider]  Zinc Oxide 10 % OINT Apply topically.   Yes  [provider]  calcium carbonate (TUMS - DOSED IN MG ELEMENTAL CALCIUM) 500 MG chewable tablet Chew 3 tablets by mouth daily.    [provider]      Allergies    Bee venom, Codeine, and Hydrocodone    Review of Systems   Review of Systems  Constitutional:  Positive for activity change. Negative for fever.  HENT: Negative.    Eyes: Negative.   Respiratory:  Negative for chest tightness and shortness of breath.   Cardiovascular:  Negative for chest pain.  Gastrointestinal:  Positive for diarrhea. Negative for abdominal pain and nausea.  Genitourinary: Negative.   Musculoskeletal:  Negative for arthralgias, joint swelling and neck pain.  Skin: Negative.  Negative for rash and wound.  Neurological:  Positive for weakness and light-headedness. Negative for dizziness, numbness and headaches.  Psychiatric/Behavioral:  Positive for behavioral problems.    Physical Exam Updated Vital Signs BP (!) 192/75    Pulse 61    Temp 98.9 F (37.2 C) (Oral)    Resp 13    Ht 5\' 3"  (1.6 m)    Wt 62.1 kg    SpO2 100%    BMI 24.25 kg/m  Physical Exam  ED Results / Procedures / Treatments   Labs (all labs ordered are listed, but only abnormal results are displayed) Labs Reviewed  COMPREHENSIVE METABOLIC PANEL - Abnormal; Notable for the following components:      Result Value   Creatinine, Ser 1.19 (*)    Calcium 8.4 (*)    Total Protein 6.2 (*)    Albumin 3.0 (*)    GFR, Estimated 48 (*)    All other components within normal limits  URINALYSIS, ROUTINE W REFLEX MICROSCOPIC - Abnormal; Notable for the following components:   Specific Gravity, Urine <1.005 (*)    All other components within normal limits  RESP PANEL BY RT-PCR (FLU A&B, COVID) ARPGX2  CBC WITH DIFFERENTIAL/PLATELET  PROCALCITONIN  PROCALCITONIN    EKG EKG Interpretation  Date/Time:  Wednesday September 13 2021 15:25:44 EST Ventricular Rate:  62 PR Interval:  194 QRS Duration: 124 QT  Interval:  480 QTC Calculation: 487 R Axis:   8 Text Interpretation: Normal sinus rhythm Left bundle branch block Abnormal ECG When compared with ECG of 26-Aug-2021 16:15, PREVIOUS ECG IS PRESENT Since last tracing arm leads are correctly placed Otherwise no significant change Confirmed by Daleen Bo [phone]) on 09/13/2021 3:46:38 PM  Radiology DG Chest 2 View  Result Date: 09/13/2021 CLINICAL DATA:  sob with exertion, dizziness EXAM: CHEST - 2 VIEW COMPARISON:  Chest x-ray 08/31/2021, chest x-ray 07/10/2019 FINDINGS: The heart and mediastinal contours are unchanged. Aortic calcification. Query vague hazy airspace opacity within the right lower lung zone. No pulmonary edema. No pleural effusion. No pneumothorax. No acute osseous abnormality. IMPRESSION: Query vague hazy airspace opacity within the right lower lung zone. Electronically Signed   By: Iven Finn M.D.   On: 09/13/2021 16:36  CT Head Wo Contrast  Result Date: 09/13/2021 CLINICAL DATA:  Mental status change, unknown cause EXAM: CT HEAD WITHOUT CONTRAST TECHNIQUE: Contiguous axial images were obtained from the base of the skull through the vertex without intravenous contrast. RADIATION DOSE REDUCTION: This exam was performed according to the departmental dose-optimization program which includes automated exposure control, adjustment of the mA and/or kV according to patient size and/or use of iterative reconstruction technique. COMPARISON:  CT head 08/26/2021 BRAIN: BRAIN Patchy and confluent areas of decreased attenuation are noted throughout the deep and periventricular white matter of the cerebral hemispheres bilaterally, compatible with chronic microvascular ischemic disease. No evidence of large-territorial acute infarction. No parenchymal hemorrhage. No mass lesion. No extra-axial collection. No mass effect or midline shift. No hydrocephalus. Basilar cisterns are patent. Vascular: No hyperdense vessel. Atherosclerotic calcifications  are present within the cavernous internal carotid arteries. Skull: No acute fracture or focal lesion. Sinuses/Orbits: High density right maxillary mucosal thickening. Mucosal thickening of the right maxillary sinus. Paranasal sinuses and mastoid air cells are clear. The orbits are unremarkable. Other: None. IMPRESSION: 1. No acute intracranial abnormality. 2. Right maxillary and ethmoid mucosal thickening. Given hyperdensity finding may represent inspissated mucus versus fungal infection. Electronically Signed   By: Iven Finn M.D.   On: 09/13/2021 19:55    Procedures Procedures    Medications Ordered in ED Medications  sodium chloride 0.9 % bolus 500 mL (0 mLs Intravenous Stopped 09/13/21 2056)  carvedilol (COREG) tablet 12.5 mg (12.5 mg Oral Given 09/13/21 2230)  hydrALAZINE (APRESOLINE) tablet 25 mg (25 mg Oral Given 09/13/21 2231)    ED Course/ Medical Decision Making/ A&P                           Medical Decision Making Patient with complaints of dizziness and a documented low blood pressure by her physical therapist at home this morning.  She has not been hypotensive while she has been here, although she was found to be orthostatic by EMS upon their arrival.  She has had a generalized failure to thrive, diarrhea with incontinence of stools, intermittent confusion per family at bedside.  Amount and/or Complexity of Data Reviewed Independent Historian:     Details: Daughter and son at bedside confirm patient has had increasing weakness, failure to thrive, new incontinence and intermittent confusion. Labs: ordered. Radiology: ordered.    Details: Chest x-ray was initially obtained for weakness work-up, there is a hazy opacity at the right lung base.  She has been afebrile here, she has a normal WBC count, ambulated here without shortness of breath or desaturation.  Is unclear if this represents community-acquired pneumonia. ECG/medicine tests: ordered and independent interpretation  performed.    Details: Stable EKG. Discussion of management or test interpretation with external provider(s): Patient was discussed with Dr. Clearence Ped who accepts patient for admission.  She will add a procalcitonin to her work-up prior to decision for starting antibiotics.  Patient's evening blood pressure medications were ordered as her blood pressures here have actually been elevated, although initially she was sent in due to hypotension.  Risk Decision regarding hospitalization.           Final Clinical Impression(s) / ED Diagnoses Final diagnoses:  Generalized weakness  Failure to thrive in adult    Rx / DC Orders ED Discharge Orders     None         Landis Martins 09/13/21 2315    Daleen Bo, MD  09/14/21 0028 ° °

## 2021-09-13 NOTE — ED Triage Notes (Signed)
Pt to the ED from Home With EMS with complaints of dizziness that began today. Pt reports she has had diarrhea for the past several days.  Pt is orthostatic positive per EMS.

## 2021-09-13 NOTE — ED Notes (Signed)
Pt ambulated to the nurses station at this time without any issues o2 stayed between 98-99 percent no chest pain for the patient or sob noted. Brittney Williams

## 2021-09-13 NOTE — ED Notes (Signed)
Attempted report x1. 

## 2021-09-13 NOTE — ED Notes (Signed)
Tech at bedside performing ekg.

## 2021-09-14 DIAGNOSIS — I10 Essential (primary) hypertension: Secondary | ICD-10-CM

## 2021-09-14 DIAGNOSIS — I5022 Chronic systolic (congestive) heart failure: Secondary | ICD-10-CM

## 2021-09-14 DIAGNOSIS — E440 Moderate protein-calorie malnutrition: Secondary | ICD-10-CM

## 2021-09-14 DIAGNOSIS — E7849 Other hyperlipidemia: Secondary | ICD-10-CM

## 2021-09-14 DIAGNOSIS — R531 Weakness: Principal | ICD-10-CM

## 2021-09-14 DIAGNOSIS — R4182 Altered mental status, unspecified: Secondary | ICD-10-CM

## 2021-09-14 LAB — CBC WITH DIFFERENTIAL/PLATELET
Abs Immature Granulocytes: 0.01 10*3/uL (ref 0.00–0.07)
Basophils Absolute: 0 10*3/uL (ref 0.0–0.1)
Basophils Relative: 1 %
Eosinophils Absolute: 0 10*3/uL (ref 0.0–0.5)
Eosinophils Relative: 1 %
HCT: 34.5 % — ABNORMAL LOW (ref 36.0–46.0)
Hemoglobin: 10.7 g/dL — ABNORMAL LOW (ref 12.0–15.0)
Immature Granulocytes: 0 %
Lymphocytes Relative: 38 %
Lymphs Abs: 1.6 10*3/uL (ref 0.7–4.0)
MCH: 29 pg (ref 26.0–34.0)
MCHC: 31 g/dL (ref 30.0–36.0)
MCV: 93.5 fL (ref 80.0–100.0)
Monocytes Absolute: 0.3 10*3/uL (ref 0.1–1.0)
Monocytes Relative: 7 %
Neutro Abs: 2.3 10*3/uL (ref 1.7–7.7)
Neutrophils Relative %: 53 %
Platelets: 240 10*3/uL (ref 150–400)
RBC: 3.69 MIL/uL — ABNORMAL LOW (ref 3.87–5.11)
RDW: 14.3 % (ref 11.5–15.5)
WBC: 4.2 10*3/uL (ref 4.0–10.5)
nRBC: 0 % (ref 0.0–0.2)

## 2021-09-14 LAB — COMPREHENSIVE METABOLIC PANEL
ALT: 10 U/L (ref 0–44)
AST: 12 U/L — ABNORMAL LOW (ref 15–41)
Albumin: 2.7 g/dL — ABNORMAL LOW (ref 3.5–5.0)
Alkaline Phosphatase: 59 U/L (ref 38–126)
Anion gap: 7 (ref 5–15)
BUN: 17 mg/dL (ref 8–23)
CO2: 21 mmol/L — ABNORMAL LOW (ref 22–32)
Calcium: 8.4 mg/dL — ABNORMAL LOW (ref 8.9–10.3)
Chloride: 111 mmol/L (ref 98–111)
Creatinine, Ser: 0.91 mg/dL (ref 0.44–1.00)
GFR, Estimated: >60 mL/min (ref >60)
Glucose, Bld: 102 mg/dL — ABNORMAL HIGH (ref 70–99)
Potassium: 3.6 mmol/L (ref 3.5–5.1)
Sodium: 139 mmol/L (ref 135–145)
Total Bilirubin: 0.6 mg/dL (ref 0.3–1.2)
Total Protein: 5.6 g/dL — ABNORMAL LOW (ref 6.5–8.1)

## 2021-09-14 LAB — VITAMIN B12: Vitamin B-12: 180 pg/mL (ref 180–914)

## 2021-09-14 LAB — VITAMIN D 25 HYDROXY (VIT D DEFICIENCY, FRACTURES): Vit D, 25-Hydroxy: 29.45 ng/mL — ABNORMAL LOW (ref 30–100)

## 2021-09-14 LAB — PROCALCITONIN: Procalcitonin: <0.1 ng/mL

## 2021-09-14 LAB — FOLATE: Folate: 7 ng/mL (ref >5.9)

## 2021-09-14 LAB — TSH: TSH: 3.779 u[IU]/mL (ref 0.350–4.500)

## 2021-09-14 LAB — MAGNESIUM: Magnesium: 1.9 mg/dL (ref 1.7–2.4)

## 2021-09-14 MED ORDER — ALBUTEROL SULFATE (2.5 MG/3ML) 0.083% IN NEBU: 2.5000 mg | INHALATION_SOLUTION | RESPIRATORY_TRACT | Status: DC | PRN

## 2021-09-14 MED ORDER — LISINOPRIL 10 MG PO TABS: 10.0000 mg | ORAL_TABLET | Freq: Every day | ORAL | Status: DC

## 2021-09-14 MED ORDER — FLUOXETINE HCL 20 MG PO CAPS
40.0000 mg | ORAL_CAPSULE | Freq: Every day | ORAL | Status: DC
  Administered 2021-09-14 – 2021-09-15 (×2): 40 mg via ORAL
  Filled 2021-09-14 (×2): qty 2

## 2021-09-14 MED ORDER — MECLIZINE HCL 12.5 MG PO TABS: 12.5000 mg | ORAL_TABLET | Freq: Two times a day (BID) | ORAL | Status: DC | PRN

## 2021-09-14 MED ORDER — CARVEDILOL 12.5 MG PO TABS
12.5000 mg | ORAL_TABLET | Freq: Two times a day (BID) | ORAL | Status: DC
  Administered 2021-09-14 – 2021-09-15 (×4): 12.5 mg via ORAL
  Filled 2021-09-14 (×4): qty 1

## 2021-09-14 MED ORDER — OXYBUTYNIN CHLORIDE 5 MG PO TABS
5.0000 mg | ORAL_TABLET | Freq: Two times a day (BID) | ORAL | Status: DC
  Administered 2021-09-14 – 2021-09-15 (×4): 5 mg via ORAL
  Filled 2021-09-14 (×4): qty 1

## 2021-09-14 MED ORDER — DONEPEZIL HCL 5 MG PO TABS
10.0000 mg | ORAL_TABLET | Freq: Every evening | ORAL | Status: DC
  Administered 2021-09-14 – 2021-09-15 (×2): 10 mg via ORAL
  Filled 2021-09-14 (×3): qty 2

## 2021-09-14 MED ORDER — HYDRALAZINE HCL 25 MG PO TABS
25.0000 mg | ORAL_TABLET | Freq: Three times a day (TID) | ORAL | Status: DC
  Administered 2021-09-14: 25 mg via ORAL
  Filled 2021-09-14: qty 1

## 2021-09-14 MED ORDER — ISOSORBIDE MONONITRATE ER 60 MG PO TB24
30.0000 mg | ORAL_TABLET | Freq: Every day | ORAL | Status: DC
  Administered 2021-09-14 – 2021-09-15 (×2): 30 mg via ORAL
  Filled 2021-09-14 (×2): qty 1

## 2021-09-14 MED ORDER — ACETAMINOPHEN 325 MG PO TABS
650.0000 mg | ORAL_TABLET | Freq: Four times a day (QID) | ORAL | Status: DC | PRN
  Administered 2021-09-14 – 2021-09-15 (×2): 650 mg via ORAL
  Filled 2021-09-14 (×2): qty 2

## 2021-09-14 MED ORDER — ADULT MULTIVITAMIN W/MINERALS CH
1.0000 | ORAL_TABLET | Freq: Every day | ORAL | Status: DC
  Administered 2021-09-14 – 2021-09-15 (×2): 1 via ORAL
  Filled 2021-09-14 (×2): qty 1

## 2021-09-14 MED ORDER — LISINOPRIL 10 MG PO TABS
40.0000 mg | ORAL_TABLET | Freq: Every day | ORAL | Status: DC
  Administered 2021-09-14: 40 mg via ORAL
  Filled 2021-09-14: qty 4

## 2021-09-14 MED ORDER — HEPARIN SODIUM (PORCINE) 5000 UNIT/ML IJ SOLN
5000.0000 [IU] | Freq: Three times a day (TID) | INTRAMUSCULAR | Status: DC
  Administered 2021-09-14 – 2021-09-15 (×6): 5000 [IU] via SUBCUTANEOUS
  Filled 2021-09-14 (×6): qty 1

## 2021-09-14 MED ORDER — SPIRONOLACTONE 25 MG PO TABS
25.0000 mg | ORAL_TABLET | Freq: Every day | ORAL | Status: DC
  Administered 2021-09-14 – 2021-09-15 (×2): 25 mg via ORAL
  Filled 2021-09-14 (×2): qty 1

## 2021-09-14 MED ORDER — ONDANSETRON HCL 4 MG PO TABS: 4.0000 mg | ORAL_TABLET | Freq: Four times a day (QID) | ORAL | Status: DC | PRN

## 2021-09-14 MED ORDER — OXYCODONE HCL 5 MG PO TABS: 5.0000 mg | ORAL_TABLET | ORAL | Status: DC | PRN

## 2021-09-14 MED ORDER — ONDANSETRON HCL 4 MG/2ML IJ SOLN: 4.0000 mg | Freq: Four times a day (QID) | INTRAMUSCULAR | Status: DC | PRN

## 2021-09-14 MED ORDER — ATORVASTATIN CALCIUM 40 MG PO TABS
40.0000 mg | ORAL_TABLET | Freq: Every day | ORAL | Status: DC
  Administered 2021-09-14 – 2021-09-15 (×2): 40 mg via ORAL
  Filled 2021-09-14 (×2): qty 1

## 2021-09-14 MED ORDER — ACETAMINOPHEN 650 MG RE SUPP: 650.0000 mg | Freq: Four times a day (QID) | RECTAL | Status: DC | PRN

## 2021-09-14 NOTE — Assessment & Plan Note (Addendum)
-   Currently stable, denies any shortness of breath or chest pain, or lower extremity edema -Monitoring daily weights, I's and O's -Continue current meds: Including lisinopril, Coreg, Aldactone -stable

## 2021-09-14 NOTE — TOC Initial Note (Addendum)
Transition of Care [facility]) - Initial/Assessment Note    Patient Details  Name: Brittney Williams MRN: [id number] Date of Birth: Dec 12, 1945  [facility] CM/SW Contact:    Boneta Lucks, RN Phone Number: 09/14/2021, 1:42 PM  Clinical Narrative:      Patient admitted with Generalized weakness. Married, husband in [place] rehab at this time. They get meals on wheels. Patient is active with [facility] for RN/PT. PT eval complete, no new needs. MD to order home health resumption orders.             Addendum Anderson Malta, daughter called. She state they have hired day Human resources officer, her mother does not care for herself, will not eat or take medication, she will soil herself unless someone is there with her. Anderson Malta works at [employer], she states her mother make $2 to much to get medicaid and does not make enough to go to [place], even with the discount offers to [place]. She will not go home with Anderson Malta at night. She was hoping for SNF, but patient PT eval was no follow up. Family will continue to look of care. TOC to follow.   Expected Discharge Plan: [place] Barriers to Discharge: Continued Medical Work up   Patient Goals and CMS Choice Patient states their goals for this hospitalization and ongoing recovery are:: to return home.     Expected Discharge Plan and Services Expected Discharge Plan: [place] Choice: [place] arrangements for the past 2 months: Apartment                   Prior Living Arrangements/Services Living arrangements for the past 2 months: Apartment Lives with:: Spouse            Current home services: Meals on wheels    Activities of Daily Living Home Assistive Devices/Equipment: Environmental consultant (specify type), Grab bars in shower, Grab bars around toilet ADL Screening (condition at time of admission) Patient's cognitive ability adequate to safely complete daily activities?: No Is  the patient deaf or have difficulty hearing?: Yes Does the patient have difficulty seeing, even when wearing glasses/contacts?: No Does the patient have difficulty concentrating, remembering, or making decisions?: No Patient able to express need for assistance with ADLs?: No Does the patient have difficulty dressing or bathing?: Yes Independently performs ADLs?: No (per patient independent until today due to dizziness) Communication: Needs assistance Is this a change from baseline?: Change from baseline, expected to last <3 days Dressing (OT): Needs assistance Is this a change from baseline?: Change from baseline, expected to last <3days Grooming: Needs assistance Is this a change from baseline?: Change from baseline, expected to last <3 days Feeding: Independent Bathing: Needs assistance Is this a change from baseline?: Change from baseline, expected to last <3 days Toileting: Needs assistance Is this a change from baseline?: Pre-admission baseline In/Out Bed: Needs assistance Is this a change from baseline?: Change from baseline, expected to last <3 days Walks in Home: Needs assistance Is this a change from baseline?: Change from baseline, expected to last <3 days Does the patient have difficulty walking or climbing stairs?: Yes Weakness of Legs: Both Weakness of Arms/Hands: None  Permission Sought/Granted   Emotional Assessment     Affect (typically observed): Accepting Orientation: : Oriented to Self, Oriented to Place, Oriented to  Time, Oriented to Situation Alcohol / Substance Use: Not Applicable Psych Involvement: No (comment)  Admission diagnosis:  Failure to thrive in adult [R62.7] Generalized weakness [R53.1] Patient Active Problem List   Diagnosis Date Noted   Generalized weakness 09/13/2021   E. coli UTI (urinary tract infection) 08/28/2021   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 %/ G2 DD 08/27/2021   Dilated  cardiomyopathy ([place]) 03/30/2019   Tobacco abuse 03/30/2019   Syncope and collapse 01/24/2019   Confusion 01/24/2019   Urinary tract infection without hematuria 85/92/9244   Systolic CHF, chronic ([place]) 01/24/2019   Altered mental status 01/23/2019   Essential hypertension 06/11/2016   Hyperlipidemia 06/11/2016   Depression with anxiety 06/11/2016   Multiple fractures of ribs of right side 06/05/2016   Subcutaneous emphysema ([place]) 06/05/2016   Acute blood loss anemia 06/05/2016   Ankle wound, left, initial encounter 06/01/2016   PCP:  Redmond School, MD Pharmacy:   [facility] [place] [address] [place] [address] Phone: [phone] Fax: [phone]  Readmission Risk Interventions Readmission Risk Prevention Plan 09/14/2021  Transportation Screening Complete  Home Care Screening Complete  Medication Review (RN CM) Complete  Some recent data might be hidden

## 2021-09-14 NOTE — Assessment & Plan Note (Signed)
Continue to encourage nutrient dense p.o. intake Checking for nutritional deficiencies as mentioned above

## 2021-09-14 NOTE — Evaluation (Signed)
Physical Therapy Evaluation Patient Details Name: NANDI TONNESEN MRN: [id number] DOB: Nov 09, 1945 Today's Date: 09/14/2021  History of Present Illness  GERALENE AFSHAR is a 76 y.o. female with medical history significant of anxiety, bilateral ankle fractures in 2017, depression,, hypertension, recent hospitalization for UTI, weakness and dizziness.  Patient reports that she has had intermittent dizziness for about 6 weeks.  Its not always upon standing, but today it was upon standing. she feels like the room is spinning around her and her vision becomes blurry.  She does not have an associated headache, tinnitus, chest pain, or palpitations.  She reports that this feels like her normal dizziness.  She had a decreased appetite and decreased liquid p.o. intake as well.  She reports urinary incontinence and bowel incontinence.  She had trouble walking due to weakness.  Patient reports loose stools as well.  This been going on for 2 months.  It happens about twice a day.  It is responsive to Imodium, but if she stops taking Imodium she starts having loose stools again.  She has had 3 of these loose stools in the last 24 hours.  Patient had reported dyspnea on exertion for the last 2 months as well.  No cough, no fever.  As mentioned before no chest pain.  She has had increased fatigue.  3 weeks ago her husband was moved to a rehab facility, and since then she has been subtly confused.  Family reports that she talks about getting ready for court and meeting with an attorney-which is not something is actually happening in her life.  They have other examples like this.  He also reports that she defecates and voids on herself in bed and just lays in that and will get up.  This is not her baseline mentation either.  This could be due to the change in environment with her husband being at a rehab facility.  They have no other acute complaints at this time.   Clinical Impression  Patient demonstrates good return for  bed mobility, transferring to/from commode in bathroom and ambulation in hallway using RW without loss of balance.  Patient encouraged to ambulate with nursing staff daily as tolerated - nursing staff notified.  Plan:  Patient discharged from physical therapy to care of nursing for ambulation daily as tolerated for length of stay.        Recommendations for follow up therapy are one component of a multi-disciplinary discharge planning process, led by the attending physician.  Recommendations may be updated based on patient status, additional functional criteria and insurance authorization.  Follow Up Recommendations Home health PT    Assistance Recommended at Discharge Set up Supervision/Assistance  Patient can return home with the following  Help with stairs or ramp for entrance;A little help with bathing/dressing/bathroom;A little help with walking and/or transfers    Equipment Recommendations None recommended by PT  Recommendations for Other Services       Functional Status Assessment Patient has had a recent decline in their functional status and/or demonstrates limited ability to make significant improvements in function in a reasonable and predictable amount of time     Precautions / Restrictions Precautions Precautions: Fall Restrictions Weight Bearing Restrictions: No      Mobility  Bed Mobility Overal bed mobility: Modified Independent                  Transfers Overall transfer level: Modified independent  General transfer comment: slightly increased time    Ambulation/Gait Ambulation/Gait assistance: Modified independent (Device/Increase time), Supervision Gait Distance (Feet): 100 Feet   Gait Pattern/deviations: Decreased step length - right, Decreased step length - left, Decreased stride length Gait velocity: slightly decreased     General Gait Details: slightly labored cadence without loss of balance, limited mostly due to mild  SOB  Stairs            Wheelchair Mobility    Modified Rankin (Stroke Patients Only)       Balance Overall balance assessment: Needs assistance Sitting-balance support: Feet supported, No upper extremity supported Sitting balance-Leahy Scale: Good Sitting balance - Comments: seated at EOB   Standing balance support: During functional activity, No upper extremity supported Standing balance-Leahy Scale: Fair Standing balance comment: fair/poor without AD, fair/good using RW                             Pertinent Vitals/Pain Pain Assessment Pain Assessment: No/denies pain    Home Living Family/patient expects to be discharged to:: Private residence Living Arrangements: Spouse/significant other (spouse present at nursing home for rehab) Available Help at Discharge: Family;Available PRN/intermittently Type of Home: Apartment Home Access: Level entry       Home Layout: One level Home Equipment: Conservation officer, nature (2 wheels);Shower seat;BSC/3in1      Prior Function Prior Level of Function : Needs assist       Physical Assist : Mobility (physical) Mobility (physical): Bed mobility;Transfers;Gait;Stairs   Mobility Comments: household ambulator without AD, uses RW for longer distances ADLs Comments: assisted by family     Hand Dominance   Dominant Hand: Left    Extremity/Trunk Assessment   Upper Extremity Assessment Upper Extremity Assessment: Overall WFL for tasks assessed    Lower Extremity Assessment Lower Extremity Assessment: Overall WFL for tasks assessed    Cervical / Trunk Assessment Cervical / Trunk Assessment: Normal  Communication   Communication: HOH  Cognition Arousal/Alertness: Awake/alert Behavior During Therapy: WFL for tasks assessed/performed Overall Cognitive Status: Within Functional Limits for tasks assessed                                          General Comments      Exercises      Assessment/Plan    PT Assessment Patient does not need any further PT services  PT Problem List Decreased strength;Decreased mobility;Decreased activity tolerance       PT Treatment Interventions      PT Goals (Current goals can be found in the Care Plan section)  Acute Rehab PT Goals Patient Stated Goal: Return home PT Goal Formulation: With patient Time For Goal Achievement: 09/14/21 Potential to Achieve Goals: Good    Frequency       Co-evaluation               AM-PAC PT "6 Clicks" Mobility  Outcome Measure Help needed turning from your back to your side while in a flat bed without using bedrails?: None Help needed moving from lying on your back to sitting on the side of a flat bed without using bedrails?: None Help needed moving to and from a bed to a chair (including a wheelchair)?: None Help needed standing up from a chair using your arms (e.g., wheelchair or bedside chair)?: None Help needed to walk in hospital room?:  None Help needed climbing 3-5 steps with a railing? : A Little 6 Click Score: 23    End of Session   Activity Tolerance: Patient tolerated treatment well;Patient limited by fatigue Patient left: in bed;with call bell/phone within reach Nurse Communication: Mobility status PT Visit Diagnosis: Muscle weakness (generalized) (M62.81);Other abnormalities of gait and mobility (R26.89)    Time: 2637-8588 PT Time Calculation (min) (ACUTE ONLY): 32 min   Charges:   PT Evaluation $PT Eval Moderate Complexity: 1 Mod PT Treatments $Therapeutic Activity: 23-37 mins        3:09 PM, 09/14/21 Lonell Grandchild, MPT Physical Therapist with [facility] [phone] office [phone] mobile phone

## 2021-09-14 NOTE — Progress Notes (Signed)
PROGRESS NOTE    Patient: Brittney Williams                            PCP: Redmond School, MD                    DOB: 1945-10-05            DOA: 09/13/2021 [id number]             DOS: 09/14/2021, 11:24 AM   LOS: 1 day   Date of Service: The patient was seen and examined on 09/14/2021  Subjective:   The patient was seen and examined this morning. Awake alert this morning, complaining of fluctuating blood pressure, generalized weaknesses, and sometimes confusion. Denies that having any pain, or shortness of breath this morning  Brief Narrative:   For HPI:  Brittney Williams is a 76 y.o. female with medical history significant of anxiety, bilateral ankle fractures in 2017, depression,, hypertension, recent hospitalization for UTI, weakness and dizziness.  Patient reports that she has had intermittent dizziness for about 6 weeks.  Its not always upon standing, but today it was upon standing. she feels like the room is spinning around her and her vision becomes blurry.  She does not have an associated headache, tinnitus, chest pain, or palpitations.  She reports that this feels like her normal dizziness.  She had a decreased appetite and decreased liquid p.o. intake as well.  She reports urinary incontinence and bowel incontinence.  She had trouble walking due to weakness.  Patient reports loose stools as well.  This been going on for 2 months.  It happens about twice a day.  It is responsive to Imodium, but if she stops taking Imodium she starts having loose stools again.  She has had 3 of these loose stools in the last 24 hours.  Patient had reported dyspnea on exertion for the last 2 months as well.  No cough, no fever.  As mentioned before no chest pain.  She has had increased fatigue.  3 weeks ago her husband was moved to a rehab facility, and since then she has been subtly confused.  Family reports that she talks about getting ready for court and meeting with an attorney-which is not something is  actually happening in her life.  They have other examples like this.  He also reports that she defecates and voids on herself in bed and just lays in that and will get up.  This is not her baseline mentation either.  This could be due to the change in environment with her husband being at a rehab facility.  They have no other acute complaints at this time.   Patient does not smoke, does not drink alcohol, does not use illicit drugs.  She is vaccinated for COVID.  Patient is DNI.   In the ED Temp 98.9, heart rate 60-95, respiratory rate 10-18, blood pressure 133/53-199/93, oxygen sats 94 to 100% No leukocytosis white blood cell count of 5.5, hemoglobin 12.0, platelets 235 Chemistry panel is unremarkable Albumin is 3.0 Respiratory panel is negative UA is not indicative of UTI CT head shows no acute intracranial abnormality there is some sinus findings at this site could be mucus versus fungal infection Chest x-ray shows airspace opacity in the right lower lung zone-patient has not had a cough or fever no leukocytosis, procalcitonin is negative-not likely pneumonia EKG showed normal sinus rhythm with a heart rate  of 62, QTc 487 left bundle branch block 500 mL normal saline bolus given    Assessment & Plan:   Principal Problem:   Generalized weakness Active Problems:   Altered mental status   Essential hypertension   Hyperlipidemia   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 intake -Given decreased p.o. intake at home we will also be  -TSH, B12 thiamine folate within normal limits -Family concerned that patient is unsafe to take care of herself at home, will likely need rehab placement -Consult TOC and PT   Altered mental status- (present on admission) - Patient is awake alert  oriented -Waxing change mental status due to external stressors such as husband recently be admitted to nursing home -Patient also has a history of dementia, continue Aricept  -B12, folate, TSH within normal limits -Both Prozac and Lexapro in med list -polypharmacy could be contributing, holding Prozac -PT/OT consulted for evaluation-pending recommendation regarding SNF  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 %/ G2 DD- (present on admission) - Currently stable, denies any shortness of breath or chest pain, or lower extremity edema -Monitoring daily weights, I's and O's -Continue current meds: Including lisinopril, Coreg, Aldactone  Systolic CHF, chronic (HCC) - Not currently in exacerbation -Continue Imdur, spironolactone, Coreg, statin -Continue to monitor  Hyperlipidemia- (present on admission) - Continue statin  Essential hypertension- (present on admission) - Brief episode of accelerated hypertension -Resumed home medication of lisinopril, Coreg, hydralazine -Blood pressure is improved, as needed hydralazine IV  Protein-calorie malnutrition, moderate (HCC)-resolved as of 09/14/2021, (present on admission) Continue to encourage nutrient dense p.o. intake Checking for nutritional deficiencies as mentioned above         ------------------------------------------------------------------------------------------------------------------------------------------------  DVT prophylaxis:  heparin injection 5,000 Units Start: 09/14/21 0100 SCDs Start: 09/14/21 0005   Code Status:   Code Status: Partial Code  Family Communication: No family member present at bedside- attempt will be made to update daily The above findings and plan of care has been discussed with patient (and family)  in detail,  they expressed understanding and agreement of above. -Advance care planning has been discussed.   Admission status:   Status is:  Inpatient Remains inpatient appropriate because: Evaluation for confusion, generalized weakness, call [facility] including heart failure, hypertension--- medication adjustment   Disposition: From home  -pending PT/OT evaluation for recommendation-patient lives home alone increasingly confused, with weaknesses--- anticipating SNF -Likely SNF in 1-2 days -TOC consulted   Procedures:   No admission procedures for hospital encounter.   Antimicrobials:  Anti-infectives (From admission, onward)    None        Medication:   atorvastatin  40 mg Oral Daily   carvedilol  12.5 mg Oral BID WC   donepezil  10 mg Oral QPM   FLUoxetine  40 mg Oral Daily   heparin  5,000 Units Subcutaneous Q8H   hydrALAZINE  25 mg Oral TID   isosorbide mononitrate  30 mg Oral Daily   lisinopril  40 mg Oral Daily   multivitamin with minerals  1 tablet Oral Daily   oxybutynin  5 mg Oral BID   spironolactone  25 mg Oral Daily    acetaminophen **OR** acetaminophen, albuterol, meclizine, ondansetron **OR** ondansetron (ZOFRAN) IV, oxyCODONE   Objective:   Vitals:   09/13/21 2358 09/14/21 0007 09/14/21 0503 09/14/21  1020  BP: (!) 118/53  (!) 151/72 (!) 144/60  Pulse: (!) 58  66 69  Resp: 18  19 18   Temp: 98 F (36.7 C)  98.5 F (36.9 C)   TempSrc:      SpO2: 99%  99% 98%  Weight:  61.4 kg    Height:  5\' 3"  (1.6 m)      Intake/Output Summary (Last 24 hours) at 09/14/2021 1124 Last data filed at 09/14/2021 0900 Gross per 24 hour  Intake 600 ml  Output 13 ml  Net 587 ml   Filed Weights   09/13/21 1345 09/14/21 0007  Weight: 62.1 kg 61.4 kg     Examination:   Physical Exam  Constitution: Cachectic elderly female awake alert oriented this morning Psychiatric:   Normal and stable mood and affect, cognition intact,   HEENT:        Normocephalic, PERRL, otherwise with in Normal limits  Chest:         Chest symmetric Cardio vascular:  S1/S2, RRR, No murmure, No Rubs or Gallops  pulmonary: Clear  to auscultation bilaterally, respirations unlabored, negative wheezes / crackles Abdomen: Soft, non-tender, non-distended, bowel sounds,no masses, no organomegaly Muscular skeletal: Limited exam - in bed, able to move all 4 extremities,   severe generalized global weakness Neuro: CNII-XII intact. , normal motor and sensation, reflexes intact  Extremities: No pitting edema lower extremities, +2 pulses  Skin: Dry, warm to touch, negative for any Rashes, No open wounds Wounds: per nursing documentation   ------------------------------------------------------------------------------------------------------------------------------------------    LABs:  CBC Latest Ref Rng & Units 09/14/2021 09/13/2021 08/27/2021  WBC 4.0 - 10.5 K/uL 4.2 5.5 5.9  Hemoglobin 12.0 - 15.0 g/dL 10.7(L) 12.0 12.0  Hematocrit 36.0 - 46.0 % 34.5(L) 38.2 37.8  Platelets 150 - 400 K/uL 240 235 239   CMP Latest Ref Rng & Units 09/14/2021 09/13/2021 08/28/2021  Glucose 70 - 99 mg/dL 102(H) 99 142(H)  BUN 8 - 23 mg/dL 17 19 8   Creatinine 0.44 - 1.00 mg/dL 0.91 1.19(H) 0.91  Sodium 135 - 145 mmol/L 139 137 139  Potassium 3.5 - 5.1 mmol/L 3.6 3.6 3.3(L)  Chloride 98 - 111 mmol/L 111 107 110  CO2 22 - 32 mmol/L 21(L) 22 25  Calcium 8.9 - 10.3 mg/dL 8.4(L) 8.4(L) 8.0(L)  Total Protein 6.5 - 8.1 g/dL 5.6(L) 6.2(L) -  Total Bilirubin 0.3 - 1.2 mg/dL 0.6 0.6 -  Alkaline Phos 38 - 126 U/L 59 72 -  AST 15 - 41 U/L 12(L) 15 -  ALT 0 - 44 U/L 10 14 -       Micro Results Recent Results (from the past 240 hour(s))  Resp Panel by RT-PCR (Flu A&B, Covid) Nasopharyngeal Swab     Status: None   Collection Time: 09/13/21  3:18 PM   Specimen: Nasopharyngeal Swab; Nasopharyngeal(NP) swabs in vial transport medium  Result Value Ref Range Status   SARS Coronavirus 2 by RT PCR NEGATIVE NEGATIVE Final    Comment: (NOTE) SARS-CoV-2 target nucleic acids are NOT DETECTED.  The SARS-CoV-2 RNA is generally detectable in upper  respiratory specimens during the acute phase of infection. The lowest concentration of SARS-CoV-2 viral copies this assay can detect is 138 copies/mL. A negative result does not preclude SARS-Cov-2 infection and should not be used as the sole basis for treatment or other patient management decisions. A negative result may occur with  improper specimen collection/handling, submission of specimen other than nasopharyngeal swab, presence of viral mutation(s) within  the areas targeted by this assay, and inadequate number of viral copies(<138 copies/mL). A negative result must be combined with clinical observations, patient history, and epidemiological information. The expected result is Negative.  Fact Sheet for Patients:  EntrepreneurPulse.com.au  Fact Sheet for Healthcare Providers:  IncredibleEmployment.be  This test is no t yet approved or cleared by the [place] FDA and  has been authorized for detection and/or diagnosis of SARS-CoV-2 by FDA under an Emergency Use Authorization (EUA). This EUA will remain  in effect (meaning this test can be used) for the duration of the COVID-19 declaration under Section 564(b)(1) of the Act, 21 U.S.C.section 360bbb-3(b)(1), unless the authorization is terminated  or revoked sooner.       Influenza A by PCR NEGATIVE NEGATIVE Final   Influenza B by PCR NEGATIVE NEGATIVE Final    Comment: (NOTE) The Xpert Xpress SARS-CoV-2/FLU/RSV plus assay is intended as an aid in the diagnosis of influenza from Nasopharyngeal swab specimens and should not be used as a sole basis for treatment. Nasal washings and aspirates are unacceptable for Xpert Xpress SARS-CoV-2/FLU/RSV testing.  Fact Sheet for Patients: EntrepreneurPulse.com.au  Fact Sheet for Healthcare Providers: IncredibleEmployment.be  This test is not yet approved or cleared by the [place] FDA and has been  authorized for detection and/or diagnosis of SARS-CoV-2 by FDA under an Emergency Use Authorization (EUA). This EUA will remain in effect (meaning this test can be used) for the duration of the COVID-19 declaration under Section 564(b)(1) of the Act, 21 U.S.C. section 360bbb-3(b)(1), unless the authorization is terminated or revoked.  Performed at [facility], [address] [place], [place] [address]     Radiology Reports DG Chest 2 View  Result Date: 09/13/2021 CLINICAL DATA:  sob with exertion, dizziness EXAM: CHEST - 2 VIEW COMPARISON:  Chest x-ray 08/31/2021, chest x-ray 07/10/2019 FINDINGS: The heart and mediastinal contours are unchanged. Aortic calcification. Query vague hazy airspace opacity within the right lower lung zone. No pulmonary edema. No pleural effusion. No pneumothorax. No acute osseous abnormality. IMPRESSION: Query vague hazy airspace opacity within the right lower lung zone. Electronically Signed   By: Iven Finn M.D.   On: 09/13/2021 16:36   CT Head Wo Contrast  Result Date: 09/13/2021 CLINICAL DATA:  Mental status change, unknown cause EXAM: CT HEAD WITHOUT CONTRAST TECHNIQUE: Contiguous axial images were obtained from the base of the skull through the vertex without intravenous contrast. RADIATION DOSE REDUCTION: This exam was performed according to the departmental dose-optimization program which includes automated exposure control, adjustment of the mA and/or kV according to patient size and/or use of iterative reconstruction technique. COMPARISON:  CT head 08/26/2021 BRAIN: BRAIN Patchy and confluent areas of decreased attenuation are noted throughout the deep and periventricular white matter of the cerebral hemispheres bilaterally, compatible with chronic microvascular ischemic disease. No evidence of large-territorial acute infarction. No parenchymal hemorrhage. No mass lesion. No extra-axial collection. No mass effect or midline shift. No hydrocephalus. Basilar  cisterns are patent. Vascular: No hyperdense vessel. Atherosclerotic calcifications are present within the cavernous internal carotid arteries. Skull: No acute fracture or focal lesion. Sinuses/Orbits: High density right maxillary mucosal thickening. Mucosal thickening of the right maxillary sinus. Paranasal sinuses and mastoid air cells are clear. The orbits are unremarkable. Other: None. IMPRESSION: 1. No acute intracranial abnormality. 2. Right maxillary and ethmoid mucosal thickening. Given hyperdensity finding may represent inspissated mucus versus fungal infection. Electronically Signed   By: Iven Finn M.D.   On: 09/13/2021 19:55  SIGNED: Deatra James, MD, FHM. [facility],  Pager (please use [email or web address] to page/text) Please use Epic Secure Chat for non-urgent communication (7AM-7PM)  If 7PM-7AM, please contact night-coverage [email or web address], 09/14/2021, 11:24 AM

## 2021-09-14 NOTE — Assessment & Plan Note (Addendum)
--  accelerated hypertension - as needed hydralazine IV -Blood pressure elevated, titrating current meds Currently on: Coreg 12.5 mg twice daily, Imdur 30 mg p.o. daily, lisinopril 20 mg daily, Aldactone 25 mg daily

## 2021-09-14 NOTE — Plan of Care (Signed)
  Problem: Education: Goal: Knowledge of General Education information will improve Description Including pain rating scale, medication(s)/side effects and non-pharmacologic comfort measures Outcome: Progressing   Problem: Health Behavior/Discharge Planning: Goal: Ability to manage health-related needs will improve Outcome: Progressing   
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
## 2021-09-14 NOTE — Assessment & Plan Note (Addendum)
-   Mentation has improved, awake alert oriented x3 now -Waxing change mental status due to external stressors such as husband recently be admitted to nursing home -Patient also has a history of dementia, continue Aricept  -B12, folate, TSH within normal limits -Both Prozac and Lexapro in med list -polypharmacy could be contributing, holding Prozac -PT/OT -consulted, post evaluation patient does not qualify for SNF recommended home health

## 2021-09-14 NOTE — Assessment & Plan Note (Addendum)
-  Likely multifactorial -Deconditioning, protein calorie malnutrition -Encourage nutrient dense p.o. intake -Given decreased p.o. intake at home we will also be  -TSH, B12 thiamine folate within normal limits -Family concerned that patient is unsafe to take care of herself at home, will likely need rehab placement -PT OT recommending home health
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
## 2021-09-14 NOTE — Assessment & Plan Note (Signed)
-   Not currently in exacerbation -Continue Imdur, spironolactone, Coreg, statin -Continue to monitor

## 2021-09-14 NOTE — Assessment & Plan Note (Signed)
Continue statin. 

## 2021-09-15 LAB — PROCALCITONIN: Procalcitonin: <0.1 ng/mL

## 2021-09-15 MED ORDER — HYDRALAZINE HCL 20 MG/ML IJ SOLN
5.0000 mg | Freq: Once | INTRAMUSCULAR | Status: AC
  Administered 2021-09-15: 5 mg via INTRAVENOUS
  Filled 2021-09-15: qty 1

## 2021-09-15 MED ORDER — LISINOPRIL 10 MG PO TABS
20.0000 mg | ORAL_TABLET | Freq: Every day | ORAL | Status: DC
  Administered 2021-09-15: 20 mg via ORAL
  Filled 2021-09-15: qty 2

## 2021-09-15 NOTE — Progress Notes (Signed)
Notified by tech that patients BP was 192/75, I checked manually and got reading of 180/78. Patient states she had a dull headache and was requesting tylenol. Notified Dr. Clearence Ped to make aware. Ok to administer tylenol and new order given for hydralazine 5 mg IV once. Administered, will continue to monitor. Advised patient to call for assistance before getting OOB.

## 2021-09-15 NOTE — Discharge Summary (Signed)
Physician Discharge Summary   Patient: Brittney Williams MRN: [id number] DOB: Dec 11, 1945  Admit date:     09/13/2021  Discharge date: 09/15/21  Discharge Physician: Deatra James   PCP: Redmond School, MD   Recommendations at discharge:   Please follow-up with the primary care regarding titration of  blood pressure medications Continue monitor blood pressure at least twice a day Fall precautions  Discharge Diagnoses: Principal Problem:   Generalized weakness Active Problems:   Altered mental status   Essential hypertension   Hyperlipidemia   Systolic CHF, chronic (HCC)   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 %/ G2 DD  Resolved Problems:   GERD (gastroesophageal reflux disease)   Protein-calorie malnutrition, moderate [facility])   Hospital Course: For HPI:  DAKIYAH Williams is a 76 y.o. female with medical history significant of anxiety, bilateral ankle fractures in 2017, depression,, hypertension, recent hospitalization for UTI, weakness and dizziness.  Patient reports that she has had intermittent dizziness for about 6 weeks.  Its not always upon standing, but today it was upon standing. she feels like the room is spinning around her and her vision becomes blurry.  She does not have an associated headache, tinnitus, chest pain, or palpitations.  She reports that this feels like her normal dizziness.  She had a decreased appetite and decreased liquid p.o. intake as well.  She reports urinary incontinence and bowel incontinence.  She had trouble walking due to weakness.  Patient reports loose stools as well.  This been going on for 2 months.  It happens about twice a day.  It is responsive to Imodium, but if she stops taking Imodium she starts having loose stools again.  She has had 3 of these loose stools in the last 24 hours.  Patient had reported dyspnea on exertion for the last 2 months as well.  No cough, no fever.  As mentioned before  no chest pain.  She has had increased fatigue.  3 weeks ago her husband was moved to a rehab facility, and since then she has been subtly confused.  Family reports that she talks about getting ready for court and meeting with an attorney-which is not something is actually happening in her life.  They have other examples like this.  He also reports that she defecates and voids on herself in bed and just lays in that and will get up.  This is not her baseline mentation either.  This could be due to the change in environment with her husband being at a rehab facility.  They have no other acute complaints at this time.   Patient does not smoke, does not drink alcohol, does not use illicit drugs.  She is vaccinated for COVID.  Patient is DNI.   In the ED Temp 98.9, heart rate 60-95, respiratory rate 10-18, blood pressure 133/53-199/93, oxygen sats 94 to 100% No leukocytosis white blood cell count of 5.5, hemoglobin 12.0, platelets 235 Chemistry panel is unremarkable Albumin is 3.0 Respiratory panel is negative UA is not indicative of UTI CT head shows no acute intracranial abnormality there is some sinus findings at this site could be mucus versus fungal infection Chest x-ray shows airspace opacity in the right lower lung zone-patient has not had a cough or fever no leukocytosis, procalcitonin is negative-not likely pneumonia EKG showed normal sinus rhythm with a heart rate of 62, QTc 487 left bundle branch block 500 mL normal saline bolus given  * Generalized weakness -  Likely multifactorial -Deconditioning, protein calorie malnutrition -Encourage nutrient dense p.o. intake -Given decreased p.o. intake at home we will also be  -TSH, B12 thiamine folate within normal limits -Family concerned that patient is unsafe to take care of herself at home, will likely need rehab placement -PT/ OT -outpatient versus home health PT   Bed Mobility Overal bed mobility: Modified Independent    Transfers Overall transfer level: Modified independent General transfer comment: slightly increased time   Ambulation/Gait Ambulation/Gait assistance: Modified independent (Device/Increase time), Supervision Gait Distance (Feet): 100 Feet Gait Pattern/deviations: Decreased step length - right, Decreased step length - left, Decreased stride length Gait velocity: slightly decreased General Gait Details: slightly labored cadence without loss of balance, limited mostly due to mild SOB     Altered mental status- (present on admission) - Mentation has improved, awake alert oriented x3 now -Waxing change mental status due to external stressors such as husband recently be admitted to nursing home -Patient also has a history of dementia, continue Aricept  -B12, folate, TSH within normal limits -Both Prozac and Lexapro in med list -polypharmacy could be contributing, holding Prozac -PT/OT -consulted, post evaluation patient does not qualify for SNF recommended home health  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 %/ G2 DD- (present on admission) - Currently stable, denies any shortness of breath or chest pain, or lower extremity edema -Monitoring daily weights, I's and O's -Continue current meds: Including lisinopril, Coreg, Aldactone -stable   Systolic CHF, chronic (HCC)- (present on admission) - Not currently in exacerbation -Continue Imdur, spironolactone, Coreg, statin -Continue to monitor  Hyperlipidemia- (present on admission) - Continue statin  Essential hypertension- (present on admission) --accelerated hypertension - as needed hydralazine IV -Blood pressure elevated, titrating current meds Currently on: Coreg 12.5 mg twice daily, Imdur 30 mg p.o. daily, lisinopril 20 mg daily, Aldactone 25 mg daily  Protein-calorie malnutrition, moderate (HCC)-resolved as of 09/14/2021, (present on admission) Continue to encourage nutrient dense  p.o. intake Checking for nutritional deficiencies as mentioned above   Disposition: Home Diet recommendation:  Discharge Diet Orders (From admission, onward)     Start     Ordered   09/15/21 0000  Diet - low sodium heart healthy        09/15/21 1212           Cardiac diet  DISCHARGE MEDICATION: Allergies as of 09/15/2021       Reactions   Bee Venom Anaphylaxis   Codeine Hives   Hydrocodone         Medication List     STOP taking these medications    escitalopram 10 MG tablet Commonly known as: LEXAPRO   hydrALAZINE 25 MG tablet Commonly known as: APRESOLINE       TAKE these medications    acetaminophen 325 MG tablet Commonly known as: TYLENOL Take 2 tablets (650 mg total) by mouth every 6 (six) hours as needed for mild pain, moderate pain or fever.   atorvastatin 40 MG tablet Commonly known as: LIPITOR Take 40 mg by mouth daily.   calcium carbonate 500 MG chewable tablet Commonly known as: TUMS - dosed in mg elemental calcium Chew 3 tablets by mouth daily.   carvedilol 12.5 MG tablet Commonly known as: COREG Take 1 tablet (12.5 mg total) by mouth 2 (two) times daily with a meal.   dimenhyDRINATE 50 MG tablet Commonly known as: DRAMAMINE Take 50 mg by mouth every 8 (eight) hours as needed for dizziness.  donepezil 10 MG tablet Commonly known as: ARICEPT Take 1 tablet (10 mg total) by mouth every evening.   FLUoxetine 40 MG capsule Commonly known as: PROZAC Take 1 capsule (40 mg total) by mouth daily.   isosorbide mononitrate 30 MG 24 hr tablet Commonly known as: IMDUR Take 1 tablet (30 mg total) by mouth daily.   levothyroxine 25 MCG tablet Commonly known as: SYNTHROID Take 1 tablet (25 mcg total) by mouth daily before breakfast.   lisinopril 40 MG tablet Commonly known as: ZESTRIL Take 1 tablet (40 mg total) by mouth daily.   multivitamin with minerals Tabs tablet Take 1 tablet by mouth daily.   oxybutynin 5 MG tablet Commonly  known as: DITROPAN Take 1 tablet (5 mg total) by mouth 2 (two) times daily.   spironolactone 25 MG tablet Commonly known as: ALDACTONE Take 1 tablet (25 mg total) by mouth daily.   Vitamin D (Ergocalciferol) 1.25 MG (50000 UNIT) Caps capsule Commonly known as: DRISDOL Take 50,000 Units by mouth See admin instructions. Tuesdays   Zinc Oxide 10 % Oint Apply topically.         Discharge Exam: Filed Weights   09/13/21 1345 09/14/21 0007  Weight: 62.1 kg 61.4 kg     Physical Exam:   General:  Alert, oriented, cooperative, no distress;   HEENT:  Normocephalic, PERRL, otherwise with in Normal limits   Neuro:  CNII-XII intact. , normal motor and sensation, reflexes intact   Lungs:   Clear to auscultation BL, Respirations unlabored, no wheezes / crackles  Cardio:    S1/S2, RRR, No murmure, No Rubs or Gallops   Abdomen:   Soft, non-tender, bowel sounds active all four quadrants,  no guarding or peritoneal signs.  Muscular skeletal:  Limited exam - in bed, able to move all 4 extremities, Normal strength,  2+ pulses,  symmetric, No pitting edema  Skin:  Dry, warm to touch, negative for any Rashes,  Wounds: Please see nursing documentation        Condition at discharge: good  The results of significant diagnostics from this hospitalization (including imaging, microbiology, ancillary and laboratory) are listed below for reference.   Imaging Studies: DG Chest 2 View  Result Date: 09/13/2021 CLINICAL DATA:  sob with exertion, dizziness EXAM: CHEST - 2 VIEW COMPARISON:  Chest x-ray 08/31/2021, chest x-ray 07/10/2019 FINDINGS: The heart and mediastinal contours are unchanged. Aortic calcification. Query vague hazy airspace opacity within the right lower lung zone. No pulmonary edema. No pleural effusion. No pneumothorax. No acute osseous abnormality. IMPRESSION: Query vague hazy airspace opacity within the right lower lung zone. Electronically Signed   By: Iven Finn M.D.   On:  09/13/2021 16:36   DG Chest 2 View  Result Date: 08/31/2021 CLINICAL DATA:  Choked while eating today.  Possible aspiration. EXAM: CHEST - 2 VIEW COMPARISON:  Chest x-ray 08/26/2021 FINDINGS: The cardiac silhouette, mediastinal and hilar contours are normal. Streaky right basilar atelectasis. No definite infiltrates or effusions. No pulmonary lesions. No pneumothorax. The bony thorax is intact. IMPRESSION: Streaky right basilar atelectasis but no definite infiltrates or effusions. Electronically Signed   By: Marijo Sanes M.D.   On: 08/31/2021 18:41   CT Head Wo Contrast  Result Date: 09/13/2021 CLINICAL DATA:  Mental status change, unknown cause EXAM: CT HEAD WITHOUT CONTRAST TECHNIQUE: Contiguous axial images were obtained from the base of the skull through the vertex without intravenous contrast. RADIATION DOSE REDUCTION: This exam was performed according to the departmental dose-optimization program  which includes automated exposure control, adjustment of the mA and/or kV according to patient size and/or use of iterative reconstruction technique. COMPARISON:  CT head 08/26/2021 BRAIN: BRAIN Patchy and confluent areas of decreased attenuation are noted throughout the deep and periventricular white matter of the cerebral hemispheres bilaterally, compatible with chronic microvascular ischemic disease. No evidence of large-territorial acute infarction. No parenchymal hemorrhage. No mass lesion. No extra-axial collection. No mass effect or midline shift. No hydrocephalus. Basilar cisterns are patent. Vascular: No hyperdense vessel. Atherosclerotic calcifications are present within the cavernous internal carotid arteries. Skull: No acute fracture or focal lesion. Sinuses/Orbits: High density right maxillary mucosal thickening. Mucosal thickening of the right maxillary sinus. Paranasal sinuses and mastoid air cells are clear. The orbits are unremarkable. Other: None. IMPRESSION: 1. No acute intracranial  abnormality. 2. Right maxillary and ethmoid mucosal thickening. Given hyperdensity finding may represent inspissated mucus versus fungal infection. Electronically Signed   By: Iven Finn M.D.   On: 09/13/2021 19:55   CT Head Wo Contrast  Result Date: 08/26/2021 CLINICAL DATA:  Found down, hypotension EXAM: CT HEAD WITHOUT CONTRAST TECHNIQUE: Contiguous axial images were obtained from the base of the skull through the vertex without intravenous contrast. RADIATION DOSE REDUCTION: This exam was performed according to the departmental dose-optimization program which includes automated exposure control, adjustment of the mA and/or kV according to patient size and/or use of iterative reconstruction technique. COMPARISON:  01/23/2019, 01/26/2019 FINDINGS: Brain: Stable confluent hypodensities throughout the periventricular white matter consistent with chronic small vessel ischemic change. No evidence of acute infarct or hemorrhage. Lateral ventricles and midline structures are unremarkable. No acute extra-axial fluid collections. No mass effect. Vascular: No hyperdense vessel or unexpected calcification. Skull: Normal. Negative for fracture or focal lesion. Sinuses/Orbits: No acute finding. Other: None. IMPRESSION: 1. Stable head CT, no acute intracranial process. Electronically Signed   By: Randa Ngo M.D.   On: 08/26/2021 16:58   DG Chest Port 1 View  Result Date: 08/26/2021 CLINICAL DATA:  Weakness, hypotension EXAM: PORTABLE CHEST 1 VIEW COMPARISON:  02/12/2021 FINDINGS: Single frontal view of the chest demonstrates an unremarkable cardiac silhouette. No acute airspace disease, effusion, or pneumothorax. Linear opacities in the upper lung zones likely reflects subsegmental atelectasis or scarring. No acute bony abnormalities. IMPRESSION: 1. No acute intrathoracic process. Electronically Signed   By: Randa Ngo M.D.   On: 08/26/2021 16:44   ECHOCARDIOGRAM COMPLETE  Result Date: 08/27/2021     ECHOCARDIOGRAM REPORT   Patient Name:   JANEVA PEASTER Baka Date of Exam: 08/27/2021 Medical Rec #:  [id number]        Height:       63.0 in Accession #:    [id number]       Weight:       136.9 lb Date of Birth:  1945/10/18        BSA:          1.646 m Patient Age:    28 years         BP:           165/89 mmHg Patient Gender: F                HR:           77 bpm. Exam Location:  [facility] Procedure: 2D Echo, 3D Echo, Cardiac Doppler and Color Doppler Indications:    R55 Syncope  History:        Patient has prior history of Echocardiogram examinations, most  recent 01/24/2019. CHF, Signs/Symptoms:Syncope, Altered Mental                 Status and Chest Pain; Risk Factors:Hypertension, Dyslipidemia                 and Current Smoker.  Sonographer:    Roseanna Rainbow RDCS Referring Phys: [phone] JACOB J STINSON  Sonographer Comments: Technically difficult study due to poor echo windows. IMPRESSIONS  1. Left ventricular ejection fraction, by estimation, is 45 to 50%. The left ventricle has mildly decreased function. The left ventricle demonstrates global hypokinesis. There is paradoxical septal motion due to LBBB which likely is the primary driver of reduced EF. Left ventricular diastolic parameters are consistent with Grade II diastolic dysfunction (pseudonormalization).  2. Right ventricular systolic function is normal. The right ventricular size is normal. Tricuspid regurgitation signal is inadequate for assessing PA pressure.  3. Left atrial size was mildly dilated.  4. The mitral valve is normal in structure. Trivial mitral valve regurgitation.  5. The aortic valve is tricuspid. There is mild calcification of the aortic valve. There is mild thickening of the aortic valve. Aortic valve regurgitation is not visualized. Aortic valve sclerosis/calcification is present, without any evidence of aortic stenosis.  6. The inferior vena cava is normal in size with greater than 50% respiratory variability, suggesting right  atrial pressure of 3 mmHg. Comparison(s): Compared to prior TTE in 2020, the EF appears to have improved from ~40% to ~45%. Otherwise, there is no significant change. FINDINGS  Left Ventricle: Left ventricular ejection fraction, by estimation, is 45 to 50%. The left ventricle has mildly decreased function. The left ventricle demonstrates global hypokinesis. 3D left ventricular ejection fraction analysis performed but not reported based on interpreter judgement due to suboptimal tracking. The left ventricular internal cavity size was normal in size. There is mild left ventricular hypertrophy. Abnormal (paradoxical) septal motion, consistent with left bundle branch block. Left ventricular diastolic parameters are consistent with Grade II diastolic dysfunction (pseudonormalization). Right Ventricle: The right ventricular size is normal. No increase in right ventricular wall thickness. Right ventricular systolic function is normal. Tricuspid regurgitation signal is inadequate for assessing PA pressure. Left Atrium: Left atrial size was mildly dilated. Right Atrium: Right atrial size was normal in size. Pericardium: There is no evidence of pericardial effusion. Mitral Valve: The mitral valve is normal in structure. There is mild thickening of the mitral valve leaflet(s). There is mild calcification of the mitral valve leaflet(s). Mild mitral annular calcification. Trivial mitral valve regurgitation. Tricuspid Valve: The tricuspid valve is normal in structure. Tricuspid valve regurgitation is trivial. Aortic Valve: The aortic valve is tricuspid. There is mild calcification of the aortic valve. There is mild thickening of the aortic valve. Aortic valve regurgitation is not visualized. Aortic valve sclerosis/calcification is present, without any evidence of aortic stenosis. Pulmonic Valve: The pulmonic valve was not well visualized. Pulmonic valve regurgitation is trivial. Aorta: The aortic root and ascending aorta are  structurally normal, with no evidence of dilitation. Venous: The inferior vena cava is normal in size with greater than 50% respiratory variability, suggesting right atrial pressure of 3 mmHg. IAS/Shunts: The atrial septum is grossly normal.  LEFT VENTRICLE PLAX 2D LVIDd:         4.60 cm     Diastology LVIDs:         3.40 cm     LV e' medial:    3.12 cm/s LV PW:         1.10  cm     LV E/e' medial:  28.1 LV IVS:        1.20 cm     LV e' lateral:   5.06 cm/s LVOT diam:     1.70 cm     LV E/e' lateral: 17.4 LV SV:         41 LV SV Index:   25 LVOT Area:     2.27 cm                             3D Volume EF: LV Volumes (MOD)           3D EF:        41 % LV vol d, MOD A2C: 66.1 ml LV EDV:       153 ml LV vol d, MOD A4C: 66.8 ml LV ESV:       91 ml LV vol s, MOD A2C: 29.6 ml LV SV:        62 ml LV vol s, MOD A4C: 39.3 ml LV SV MOD A2C:     36.5 ml LV SV MOD A4C:     66.8 ml LV SV MOD BP:      33.4 ml RIGHT VENTRICLE            IVC RV Basal diam:  2.40 cm    IVC diam: 1.40 cm RV S prime:     9.30 cm/s TAPSE (M-mode): 1.7 cm LEFT ATRIUM             Index        RIGHT ATRIUM          Index LA diam:        2.85 cm 1.73 cm/m   RA Area:     7.29 cm LA Vol (A2C):   51.8 ml 31.47 ml/m  RA Volume:   11.80 ml 7.17 ml/m LA Vol (A4C):   47.1 ml 28.62 ml/m LA Biplane Vol: 49.3 ml 29.95 ml/m  AORTIC VALVE LVOT Vmax:   91.30 cm/s LVOT Vmean:  54.800 cm/s LVOT VTI:    0.179 m  AORTA Ao Root diam: 2.90 cm Ao Asc diam:  2.80 cm MITRAL VALVE MV Area (PHT): 4.68 cm    SHUNTS MV Decel Time: 162 msec    Systemic VTI:  0.18 m MV E velocity: 87.80 cm/s  Systemic Diam: 1.70 cm MV A velocity: 91.70 cm/s MV E/A ratio:  0.96 Gwyndolyn Kaufman MD Electronically signed by Gwyndolyn Kaufman MD Signature Date/Time: 08/27/2021/11:59:54 AM    Final     Microbiology: Results for orders placed or performed during the hospital encounter of 09/13/21  Resp Panel by RT-PCR (Flu A&B, Covid) Nasopharyngeal Swab     Status: None   Collection Time:  09/13/21  3:18 PM   Specimen: Nasopharyngeal Swab; Nasopharyngeal(NP) swabs in vial transport medium  Result Value Ref Range Status   SARS Coronavirus 2 by RT PCR NEGATIVE NEGATIVE Final    Comment: (NOTE) SARS-CoV-2 target nucleic acids are NOT DETECTED.  The SARS-CoV-2 RNA is generally detectable in upper respiratory specimens during the acute phase of infection. The lowest concentration of SARS-CoV-2 viral copies this assay can detect is 138 copies/mL. A negative result does not preclude SARS-Cov-2 infection and should not be used as the sole basis for treatment or other patient management decisions. A negative result may occur with  improper specimen collection/handling, submission of specimen other than nasopharyngeal swab, presence of  viral mutation(s) within the areas targeted by this assay, and inadequate number of viral copies(<138 copies/mL). A negative result must be combined with clinical observations, patient history, and epidemiological information. The expected result is Negative.  Fact Sheet for Patients:  EntrepreneurPulse.com.au  Fact Sheet for Healthcare Providers:  IncredibleEmployment.be  This test is no t yet approved or cleared by the [place] FDA and  has been authorized for detection and/or diagnosis of SARS-CoV-2 by FDA under an Emergency Use Authorization (EUA). This EUA will remain  in effect (meaning this test can be used) for the duration of the COVID-19 declaration under Section 564(b)(1) of the Act, 21 U.S.C.section 360bbb-3(b)(1), unless the authorization is terminated  or revoked sooner.       Influenza A by PCR NEGATIVE NEGATIVE Final   Influenza B by PCR NEGATIVE NEGATIVE Final    Comment: (NOTE) The Xpert Xpress SARS-CoV-2/FLU/RSV plus assay is intended as an aid in the diagnosis of influenza from Nasopharyngeal swab specimens and should not be used as a sole basis for treatment. Nasal washings  and aspirates are unacceptable for Xpert Xpress SARS-CoV-2/FLU/RSV testing.  Fact Sheet for Patients: EntrepreneurPulse.com.au  Fact Sheet for Healthcare Providers: IncredibleEmployment.be  This test is not yet approved or cleared by the [place] FDA and has been authorized for detection and/or diagnosis of SARS-CoV-2 by FDA under an Emergency Use Authorization (EUA). This EUA will remain in effect (meaning this test can be used) for the duration of the COVID-19 declaration under Section 564(b)(1) of the Act, 21 U.S.C. section 360bbb-3(b)(1), unless the authorization is terminated or revoked.  Performed at [facility], [address] [place], [place] [address]     Labs: CBC: Recent Labs  Lab 09/13/21 1604 09/14/21 0555  WBC 5.5 4.2  NEUTROABS 3.8 2.3  HGB 12.0 10.7*  HCT 38.2 34.5*  MCV 94.3 93.5  PLT 235 629   Basic Metabolic Panel: Recent Labs  Lab 09/13/21 1604 09/14/21 0555  NA 137 139  K 3.6 3.6  CL 107 111  CO2 22 21*  GLUCOSE 99 102*  BUN 19 17  CREATININE 1.19* 0.91  CALCIUM 8.4* 8.4*  MG  --  1.9   Liver Function Tests: Recent Labs  Lab 09/13/21 1604 09/14/21 0555  AST 15 12*  ALT 14 10  ALKPHOS 72 59  BILITOT 0.6 0.6  PROT 6.2* 5.6*  ALBUMIN 3.0* 2.7*   CBG: No results for input(s): GLUCAP in the last 168 hours.  Discharge time spent: greater than 30 minutes.  Signed: Deatra James, MD [facility] 09/15/2021

## 2021-09-15 NOTE — Progress Notes (Signed)
Nsg Discharge Note  Admit Date:  09/13/2021 Discharge date: 09/15/2021   Brittney Williams to be D/C'd Home per MD order.  AVS completed. Patient/caregiver able to verbalize understanding.  Discharge Medication: Allergies as of 09/15/2021       Reactions   Bee Venom Anaphylaxis   Codeine Hives   Hydrocodone         Medication List     STOP taking these medications    escitalopram 10 MG tablet Commonly known as: LEXAPRO   hydrALAZINE 25 MG tablet Commonly known as: APRESOLINE       TAKE these medications    acetaminophen 325 MG tablet Commonly known as: TYLENOL Take 2 tablets (650 mg total) by mouth every 6 (six) hours as needed for mild pain, moderate pain or fever.   atorvastatin 40 MG tablet Commonly known as: LIPITOR Take 40 mg by mouth daily.   calcium carbonate 500 MG chewable tablet Commonly known as: TUMS - dosed in mg elemental calcium Chew 3 tablets by mouth daily.   carvedilol 12.5 MG tablet Commonly known as: COREG Take 1 tablet (12.5 mg total) by mouth 2 (two) times daily with a meal.   dimenhyDRINATE 50 MG tablet Commonly known as: DRAMAMINE Take 50 mg by mouth every 8 (eight) hours as needed for dizziness.   donepezil 10 MG tablet Commonly known as: ARICEPT Take 1 tablet (10 mg total) by mouth every evening.   FLUoxetine 40 MG capsule Commonly known as: PROZAC Take 1 capsule (40 mg total) by mouth daily.   isosorbide mononitrate 30 MG 24 hr tablet Commonly known as: IMDUR Take 1 tablet (30 mg total) by mouth daily.   levothyroxine 25 MCG tablet Commonly known as: SYNTHROID Take 1 tablet (25 mcg total) by mouth daily before breakfast.   lisinopril 40 MG tablet Commonly known as: ZESTRIL Take 1 tablet (40 mg total) by mouth daily.   multivitamin with minerals Tabs tablet Take 1 tablet by mouth daily.   oxybutynin 5 MG tablet Commonly known as: DITROPAN Take 1 tablet (5 mg total) by mouth 2 (two) times daily.   spironolactone 25 MG  tablet Commonly known as: ALDACTONE Take 1 tablet (25 mg total) by mouth daily.   Vitamin D (Ergocalciferol) 1.25 MG (50000 UNIT) Caps capsule Commonly known as: DRISDOL Take 50,000 Units by mouth See admin instructions. Tuesdays   Zinc Oxide 10 % Oint Apply topically.        Discharge Assessment: Vitals:   09/15/21 0450 09/15/21 0549  BP: (!) 180/78 (!) 161/55  Pulse:  60  Resp:    Temp:    SpO2:     Skin clean, dry and intact without evidence of skin break down, no evidence of skin tears noted. IV catheter discontinued intact. Site without signs and symptoms of complications - no redness or edema noted at insertion site, patient denies c/o pain - only slight tenderness at site.  Dressing with slight pressure applied.  D/c Instructions-Education: Discharge instructions given to patient/family with verbalized understanding. D/c education completed with patient/family including follow up instructions, medication list, d/c activities limitations if indicated, with other d/c instructions as indicated by MD - patient able to verbalize understanding, all questions fully answered. Patient instructed to return to ED, call 911, or call MD for any changes in condition.  Patient escorted via [place], and D/C home via private auto.  Kathie Rhodes, RN 09/15/2021 1:39 PM

## 2021-09-15 NOTE — Care Management Important Message (Signed)
Important Message  Patient Details  Name: Brittney Williams MRN: [id number] Date of Birth: 09-16-1945   Medicare Important Message Given:  N/A - LOS <3 / Initial given by admissions     Dannette Barbara 09/15/2021, 2:12 PM

## 2021-09-15 NOTE — TOC Transition Note (Signed)
Transition of Care [facility]) - CM/SW Discharge Note   Patient Details  Name: Brittney Williams MRN: [id number] Date of Birth: [birth year]  Transition of Care [facility]) CM/SW Contact:  Boneta Lucks, RN Phone Number: 09/15/2021, 12:50 PM   Clinical Narrative:   Patient discharging home. Orders are in for [facility] home health and [facility] updated.    Final next level of care: [place] Barriers to Discharge: Barriers Resolved   Patient Goals and CMS Choice Patient states their goals for this hospitalization and ongoing recovery are:: to return home.    Discharge Placement              Patient to be transferred to facility by: family   Patient and family notified of of transfer: 09/15/21  Discharge Plan and Services     Post Acute Care Choice: Home Health             Readmission Risk Interventions Readmission Risk Prevention Plan 09/14/2021  Transportation Screening Complete  Home Care Screening Complete  Medication Review (RN CM) Complete  Some recent data might be hidden

## 2021-09-17 LAB — VITAMIN B1: Vitamin B1 (Thiamine): 84.4 nmol/L (ref 66.5–200.0)

## 2021-11-08 DIAGNOSIS — I5043 Acute on chronic combined systolic (congestive) and diastolic (congestive) heart failure: Secondary | ICD-10-CM | POA: Diagnosis not present

## 2021-11-08 DIAGNOSIS — I10 Essential (primary) hypertension: Secondary | ICD-10-CM | POA: Diagnosis not present

## 2021-11-08 DIAGNOSIS — R55 Syncope and collapse: Secondary | ICD-10-CM | POA: Diagnosis not present

## 2021-11-08 DIAGNOSIS — I959 Hypotension, unspecified: Secondary | ICD-10-CM | POA: Diagnosis not present

## 2021-12-10 DIAGNOSIS — M6281 Muscle weakness (generalized): Secondary | ICD-10-CM | POA: Diagnosis not present

## 2021-12-10 DIAGNOSIS — R279 Unspecified lack of coordination: Secondary | ICD-10-CM | POA: Diagnosis not present

## 2021-12-10 DIAGNOSIS — R627 Adult failure to thrive: Secondary | ICD-10-CM | POA: Diagnosis not present

## 2021-12-11 DIAGNOSIS — M6281 Muscle weakness (generalized): Secondary | ICD-10-CM | POA: Diagnosis not present

## 2021-12-11 DIAGNOSIS — E46 Unspecified protein-calorie malnutrition: Secondary | ICD-10-CM | POA: Diagnosis not present

## 2021-12-11 DIAGNOSIS — R1311 Dysphagia, oral phase: Secondary | ICD-10-CM | POA: Diagnosis not present

## 2021-12-11 DIAGNOSIS — R279 Unspecified lack of coordination: Secondary | ICD-10-CM | POA: Diagnosis not present

## 2021-12-11 DIAGNOSIS — R69 Illness, unspecified: Secondary | ICD-10-CM | POA: Diagnosis not present

## 2021-12-11 DIAGNOSIS — R262 Difficulty in walking, not elsewhere classified: Secondary | ICD-10-CM | POA: Diagnosis not present

## 2021-12-11 DIAGNOSIS — R627 Adult failure to thrive: Secondary | ICD-10-CM | POA: Diagnosis not present

## 2021-12-11 DIAGNOSIS — R41841 Cognitive communication deficit: Secondary | ICD-10-CM | POA: Diagnosis not present

## 2021-12-12 DIAGNOSIS — R69 Illness, unspecified: Secondary | ICD-10-CM | POA: Diagnosis not present

## 2021-12-12 DIAGNOSIS — R1311 Dysphagia, oral phase: Secondary | ICD-10-CM | POA: Diagnosis not present

## 2021-12-12 DIAGNOSIS — R5381 Other malaise: Secondary | ICD-10-CM | POA: Diagnosis not present

## 2021-12-12 DIAGNOSIS — R41841 Cognitive communication deficit: Secondary | ICD-10-CM | POA: Diagnosis not present

## 2021-12-12 DIAGNOSIS — E46 Unspecified protein-calorie malnutrition: Secondary | ICD-10-CM | POA: Diagnosis not present

## 2021-12-12 DIAGNOSIS — R262 Difficulty in walking, not elsewhere classified: Secondary | ICD-10-CM | POA: Diagnosis not present

## 2021-12-12 DIAGNOSIS — R279 Unspecified lack of coordination: Secondary | ICD-10-CM | POA: Diagnosis not present

## 2021-12-12 DIAGNOSIS — R531 Weakness: Secondary | ICD-10-CM | POA: Diagnosis not present

## 2021-12-12 DIAGNOSIS — R627 Adult failure to thrive: Secondary | ICD-10-CM | POA: Diagnosis not present

## 2021-12-12 DIAGNOSIS — M6281 Muscle weakness (generalized): Secondary | ICD-10-CM | POA: Diagnosis not present

## 2021-12-13 DIAGNOSIS — R279 Unspecified lack of coordination: Secondary | ICD-10-CM | POA: Diagnosis not present

## 2021-12-13 DIAGNOSIS — R69 Illness, unspecified: Secondary | ICD-10-CM | POA: Diagnosis not present

## 2021-12-13 DIAGNOSIS — R262 Difficulty in walking, not elsewhere classified: Secondary | ICD-10-CM | POA: Diagnosis not present

## 2021-12-13 DIAGNOSIS — R41841 Cognitive communication deficit: Secondary | ICD-10-CM | POA: Diagnosis not present

## 2021-12-13 DIAGNOSIS — E46 Unspecified protein-calorie malnutrition: Secondary | ICD-10-CM | POA: Diagnosis not present

## 2021-12-13 DIAGNOSIS — R627 Adult failure to thrive: Secondary | ICD-10-CM | POA: Diagnosis not present

## 2021-12-13 DIAGNOSIS — R1311 Dysphagia, oral phase: Secondary | ICD-10-CM | POA: Diagnosis not present

## 2021-12-13 DIAGNOSIS — M6281 Muscle weakness (generalized): Secondary | ICD-10-CM | POA: Diagnosis not present

## 2021-12-14 DIAGNOSIS — S81801D Unspecified open wound, right lower leg, subsequent encounter: Secondary | ICD-10-CM | POA: Diagnosis not present

## 2021-12-14 DIAGNOSIS — R627 Adult failure to thrive: Secondary | ICD-10-CM | POA: Diagnosis not present

## 2021-12-14 DIAGNOSIS — S71002D Unspecified open wound, left hip, subsequent encounter: Secondary | ICD-10-CM | POA: Diagnosis not present

## 2021-12-14 DIAGNOSIS — R1311 Dysphagia, oral phase: Secondary | ICD-10-CM | POA: Diagnosis not present

## 2021-12-14 DIAGNOSIS — R41841 Cognitive communication deficit: Secondary | ICD-10-CM | POA: Diagnosis not present

## 2021-12-14 DIAGNOSIS — R69 Illness, unspecified: Secondary | ICD-10-CM | POA: Diagnosis not present

## 2021-12-14 DIAGNOSIS — R279 Unspecified lack of coordination: Secondary | ICD-10-CM | POA: Diagnosis not present

## 2021-12-14 DIAGNOSIS — E46 Unspecified protein-calorie malnutrition: Secondary | ICD-10-CM | POA: Diagnosis not present

## 2021-12-14 DIAGNOSIS — M6281 Muscle weakness (generalized): Secondary | ICD-10-CM | POA: Diagnosis not present

## 2021-12-14 DIAGNOSIS — R262 Difficulty in walking, not elsewhere classified: Secondary | ICD-10-CM | POA: Diagnosis not present

## 2021-12-15 DIAGNOSIS — R69 Illness, unspecified: Secondary | ICD-10-CM | POA: Diagnosis not present

## 2021-12-15 DIAGNOSIS — R262 Difficulty in walking, not elsewhere classified: Secondary | ICD-10-CM | POA: Diagnosis not present

## 2021-12-15 DIAGNOSIS — R41841 Cognitive communication deficit: Secondary | ICD-10-CM | POA: Diagnosis not present

## 2021-12-15 DIAGNOSIS — M6281 Muscle weakness (generalized): Secondary | ICD-10-CM | POA: Diagnosis not present

## 2021-12-15 DIAGNOSIS — R279 Unspecified lack of coordination: Secondary | ICD-10-CM | POA: Diagnosis not present

## 2021-12-15 DIAGNOSIS — R627 Adult failure to thrive: Secondary | ICD-10-CM | POA: Diagnosis not present

## 2021-12-15 DIAGNOSIS — E46 Unspecified protein-calorie malnutrition: Secondary | ICD-10-CM | POA: Diagnosis not present

## 2021-12-15 DIAGNOSIS — R1311 Dysphagia, oral phase: Secondary | ICD-10-CM | POA: Diagnosis not present

## 2021-12-16 DIAGNOSIS — R262 Difficulty in walking, not elsewhere classified: Secondary | ICD-10-CM | POA: Diagnosis not present

## 2021-12-16 DIAGNOSIS — R279 Unspecified lack of coordination: Secondary | ICD-10-CM | POA: Diagnosis not present

## 2021-12-16 DIAGNOSIS — M6281 Muscle weakness (generalized): Secondary | ICD-10-CM | POA: Diagnosis not present

## 2021-12-16 DIAGNOSIS — R69 Illness, unspecified: Secondary | ICD-10-CM | POA: Diagnosis not present

## 2021-12-16 DIAGNOSIS — E46 Unspecified protein-calorie malnutrition: Secondary | ICD-10-CM | POA: Diagnosis not present

## 2021-12-16 DIAGNOSIS — R627 Adult failure to thrive: Secondary | ICD-10-CM | POA: Diagnosis not present

## 2021-12-16 DIAGNOSIS — R1311 Dysphagia, oral phase: Secondary | ICD-10-CM | POA: Diagnosis not present

## 2021-12-16 DIAGNOSIS — R41841 Cognitive communication deficit: Secondary | ICD-10-CM | POA: Diagnosis not present

## 2021-12-18 DIAGNOSIS — D649 Anemia, unspecified: Secondary | ICD-10-CM | POA: Diagnosis not present

## 2021-12-18 DIAGNOSIS — E559 Vitamin D deficiency, unspecified: Secondary | ICD-10-CM | POA: Diagnosis not present

## 2021-12-18 DIAGNOSIS — R5381 Other malaise: Secondary | ICD-10-CM | POA: Diagnosis not present

## 2021-12-18 DIAGNOSIS — R4182 Altered mental status, unspecified: Secondary | ICD-10-CM | POA: Diagnosis not present

## 2021-12-18 DIAGNOSIS — E039 Hypothyroidism, unspecified: Secondary | ICD-10-CM | POA: Diagnosis not present

## 2021-12-19 DIAGNOSIS — R262 Difficulty in walking, not elsewhere classified: Secondary | ICD-10-CM | POA: Diagnosis not present

## 2021-12-19 DIAGNOSIS — R279 Unspecified lack of coordination: Secondary | ICD-10-CM | POA: Diagnosis not present

## 2021-12-19 DIAGNOSIS — E46 Unspecified protein-calorie malnutrition: Secondary | ICD-10-CM | POA: Diagnosis not present

## 2021-12-19 DIAGNOSIS — R1311 Dysphagia, oral phase: Secondary | ICD-10-CM | POA: Diagnosis not present

## 2021-12-19 DIAGNOSIS — R41841 Cognitive communication deficit: Secondary | ICD-10-CM | POA: Diagnosis not present

## 2021-12-19 DIAGNOSIS — R69 Illness, unspecified: Secondary | ICD-10-CM | POA: Diagnosis not present

## 2021-12-19 DIAGNOSIS — R627 Adult failure to thrive: Secondary | ICD-10-CM | POA: Diagnosis not present

## 2021-12-19 DIAGNOSIS — M6281 Muscle weakness (generalized): Secondary | ICD-10-CM | POA: Diagnosis not present

## 2021-12-20 DIAGNOSIS — R69 Illness, unspecified: Secondary | ICD-10-CM | POA: Diagnosis not present

## 2021-12-20 DIAGNOSIS — R1311 Dysphagia, oral phase: Secondary | ICD-10-CM | POA: Diagnosis not present

## 2021-12-20 DIAGNOSIS — R627 Adult failure to thrive: Secondary | ICD-10-CM | POA: Diagnosis not present

## 2021-12-20 DIAGNOSIS — R262 Difficulty in walking, not elsewhere classified: Secondary | ICD-10-CM | POA: Diagnosis not present

## 2021-12-20 DIAGNOSIS — M6281 Muscle weakness (generalized): Secondary | ICD-10-CM | POA: Diagnosis not present

## 2021-12-20 DIAGNOSIS — E46 Unspecified protein-calorie malnutrition: Secondary | ICD-10-CM | POA: Diagnosis not present

## 2021-12-20 DIAGNOSIS — E119 Type 2 diabetes mellitus without complications: Secondary | ICD-10-CM | POA: Diagnosis not present

## 2021-12-20 DIAGNOSIS — E559 Vitamin D deficiency, unspecified: Secondary | ICD-10-CM | POA: Diagnosis not present

## 2021-12-20 DIAGNOSIS — R41841 Cognitive communication deficit: Secondary | ICD-10-CM | POA: Diagnosis not present

## 2021-12-20 DIAGNOSIS — R5381 Other malaise: Secondary | ICD-10-CM | POA: Diagnosis not present

## 2021-12-20 DIAGNOSIS — R4182 Altered mental status, unspecified: Secondary | ICD-10-CM | POA: Diagnosis not present

## 2021-12-20 DIAGNOSIS — R279 Unspecified lack of coordination: Secondary | ICD-10-CM | POA: Diagnosis not present

## 2021-12-21 DIAGNOSIS — R279 Unspecified lack of coordination: Secondary | ICD-10-CM | POA: Diagnosis not present

## 2021-12-21 DIAGNOSIS — R41841 Cognitive communication deficit: Secondary | ICD-10-CM | POA: Diagnosis not present

## 2021-12-21 DIAGNOSIS — R262 Difficulty in walking, not elsewhere classified: Secondary | ICD-10-CM | POA: Diagnosis not present

## 2021-12-21 DIAGNOSIS — R69 Illness, unspecified: Secondary | ICD-10-CM | POA: Diagnosis not present

## 2021-12-21 DIAGNOSIS — A498 Other bacterial infections of unspecified site: Secondary | ICD-10-CM | POA: Diagnosis not present

## 2021-12-21 DIAGNOSIS — N390 Urinary tract infection, site not specified: Secondary | ICD-10-CM | POA: Diagnosis not present

## 2021-12-21 DIAGNOSIS — J690 Pneumonitis due to inhalation of food and vomit: Secondary | ICD-10-CM | POA: Diagnosis not present

## 2021-12-21 DIAGNOSIS — M6281 Muscle weakness (generalized): Secondary | ICD-10-CM | POA: Diagnosis not present

## 2021-12-21 DIAGNOSIS — R627 Adult failure to thrive: Secondary | ICD-10-CM | POA: Diagnosis not present

## 2021-12-21 DIAGNOSIS — E46 Unspecified protein-calorie malnutrition: Secondary | ICD-10-CM | POA: Diagnosis not present

## 2021-12-21 DIAGNOSIS — R1311 Dysphagia, oral phase: Secondary | ICD-10-CM | POA: Diagnosis not present

## 2021-12-22 DIAGNOSIS — M6281 Muscle weakness (generalized): Secondary | ICD-10-CM | POA: Diagnosis not present

## 2021-12-22 DIAGNOSIS — R262 Difficulty in walking, not elsewhere classified: Secondary | ICD-10-CM | POA: Diagnosis not present

## 2021-12-22 DIAGNOSIS — R69 Illness, unspecified: Secondary | ICD-10-CM | POA: Diagnosis not present

## 2021-12-22 DIAGNOSIS — R279 Unspecified lack of coordination: Secondary | ICD-10-CM | POA: Diagnosis not present

## 2021-12-22 DIAGNOSIS — R1311 Dysphagia, oral phase: Secondary | ICD-10-CM | POA: Diagnosis not present

## 2021-12-22 DIAGNOSIS — R627 Adult failure to thrive: Secondary | ICD-10-CM | POA: Diagnosis not present

## 2021-12-22 DIAGNOSIS — E46 Unspecified protein-calorie malnutrition: Secondary | ICD-10-CM | POA: Diagnosis not present

## 2021-12-22 DIAGNOSIS — R41841 Cognitive communication deficit: Secondary | ICD-10-CM | POA: Diagnosis not present

## 2021-12-23 DIAGNOSIS — R41841 Cognitive communication deficit: Secondary | ICD-10-CM | POA: Diagnosis not present

## 2021-12-23 DIAGNOSIS — R279 Unspecified lack of coordination: Secondary | ICD-10-CM | POA: Diagnosis not present

## 2021-12-23 DIAGNOSIS — R1311 Dysphagia, oral phase: Secondary | ICD-10-CM | POA: Diagnosis not present

## 2021-12-23 DIAGNOSIS — R262 Difficulty in walking, not elsewhere classified: Secondary | ICD-10-CM | POA: Diagnosis not present

## 2021-12-23 DIAGNOSIS — M6281 Muscle weakness (generalized): Secondary | ICD-10-CM | POA: Diagnosis not present

## 2021-12-23 DIAGNOSIS — R627 Adult failure to thrive: Secondary | ICD-10-CM | POA: Diagnosis not present

## 2021-12-23 DIAGNOSIS — R69 Illness, unspecified: Secondary | ICD-10-CM | POA: Diagnosis not present

## 2021-12-23 DIAGNOSIS — E46 Unspecified protein-calorie malnutrition: Secondary | ICD-10-CM | POA: Diagnosis not present

## 2021-12-25 DIAGNOSIS — R1311 Dysphagia, oral phase: Secondary | ICD-10-CM | POA: Diagnosis not present

## 2021-12-25 DIAGNOSIS — R279 Unspecified lack of coordination: Secondary | ICD-10-CM | POA: Diagnosis not present

## 2021-12-25 DIAGNOSIS — R627 Adult failure to thrive: Secondary | ICD-10-CM | POA: Diagnosis not present

## 2021-12-25 DIAGNOSIS — R69 Illness, unspecified: Secondary | ICD-10-CM | POA: Diagnosis not present

## 2021-12-25 DIAGNOSIS — R262 Difficulty in walking, not elsewhere classified: Secondary | ICD-10-CM | POA: Diagnosis not present

## 2021-12-25 DIAGNOSIS — M6281 Muscle weakness (generalized): Secondary | ICD-10-CM | POA: Diagnosis not present

## 2021-12-25 DIAGNOSIS — E46 Unspecified protein-calorie malnutrition: Secondary | ICD-10-CM | POA: Diagnosis not present

## 2021-12-25 DIAGNOSIS — J690 Pneumonitis due to inhalation of food and vomit: Secondary | ICD-10-CM | POA: Diagnosis not present

## 2021-12-25 DIAGNOSIS — R41841 Cognitive communication deficit: Secondary | ICD-10-CM | POA: Diagnosis not present

## 2021-12-26 DIAGNOSIS — R279 Unspecified lack of coordination: Secondary | ICD-10-CM | POA: Diagnosis not present

## 2021-12-26 DIAGNOSIS — R262 Difficulty in walking, not elsewhere classified: Secondary | ICD-10-CM | POA: Diagnosis not present

## 2021-12-26 DIAGNOSIS — R627 Adult failure to thrive: Secondary | ICD-10-CM | POA: Diagnosis not present

## 2021-12-26 DIAGNOSIS — R1311 Dysphagia, oral phase: Secondary | ICD-10-CM | POA: Diagnosis not present

## 2021-12-26 DIAGNOSIS — E46 Unspecified protein-calorie malnutrition: Secondary | ICD-10-CM | POA: Diagnosis not present

## 2021-12-26 DIAGNOSIS — R41841 Cognitive communication deficit: Secondary | ICD-10-CM | POA: Diagnosis not present

## 2021-12-26 DIAGNOSIS — M6281 Muscle weakness (generalized): Secondary | ICD-10-CM | POA: Diagnosis not present

## 2021-12-26 DIAGNOSIS — R69 Illness, unspecified: Secondary | ICD-10-CM | POA: Diagnosis not present

## 2021-12-27 DIAGNOSIS — E46 Unspecified protein-calorie malnutrition: Secondary | ICD-10-CM | POA: Diagnosis not present

## 2021-12-27 DIAGNOSIS — R262 Difficulty in walking, not elsewhere classified: Secondary | ICD-10-CM | POA: Diagnosis not present

## 2021-12-27 DIAGNOSIS — R279 Unspecified lack of coordination: Secondary | ICD-10-CM | POA: Diagnosis not present

## 2021-12-27 DIAGNOSIS — R41841 Cognitive communication deficit: Secondary | ICD-10-CM | POA: Diagnosis not present

## 2021-12-27 DIAGNOSIS — R627 Adult failure to thrive: Secondary | ICD-10-CM | POA: Diagnosis not present

## 2021-12-27 DIAGNOSIS — M6281 Muscle weakness (generalized): Secondary | ICD-10-CM | POA: Diagnosis not present

## 2021-12-27 DIAGNOSIS — R1311 Dysphagia, oral phase: Secondary | ICD-10-CM | POA: Diagnosis not present

## 2021-12-27 DIAGNOSIS — R69 Illness, unspecified: Secondary | ICD-10-CM | POA: Diagnosis not present

## 2021-12-28 DIAGNOSIS — R69 Illness, unspecified: Secondary | ICD-10-CM | POA: Diagnosis not present

## 2021-12-28 DIAGNOSIS — R262 Difficulty in walking, not elsewhere classified: Secondary | ICD-10-CM | POA: Diagnosis not present

## 2021-12-28 DIAGNOSIS — M6281 Muscle weakness (generalized): Secondary | ICD-10-CM | POA: Diagnosis not present

## 2021-12-28 DIAGNOSIS — E46 Unspecified protein-calorie malnutrition: Secondary | ICD-10-CM | POA: Diagnosis not present

## 2021-12-28 DIAGNOSIS — R627 Adult failure to thrive: Secondary | ICD-10-CM | POA: Diagnosis not present

## 2021-12-28 DIAGNOSIS — R279 Unspecified lack of coordination: Secondary | ICD-10-CM | POA: Diagnosis not present

## 2021-12-28 DIAGNOSIS — R41841 Cognitive communication deficit: Secondary | ICD-10-CM | POA: Diagnosis not present

## 2021-12-28 DIAGNOSIS — R1311 Dysphagia, oral phase: Secondary | ICD-10-CM | POA: Diagnosis not present

## 2021-12-29 DIAGNOSIS — R262 Difficulty in walking, not elsewhere classified: Secondary | ICD-10-CM | POA: Diagnosis not present

## 2021-12-29 DIAGNOSIS — R41841 Cognitive communication deficit: Secondary | ICD-10-CM | POA: Diagnosis not present

## 2021-12-29 DIAGNOSIS — R69 Illness, unspecified: Secondary | ICD-10-CM | POA: Diagnosis not present

## 2021-12-29 DIAGNOSIS — M6281 Muscle weakness (generalized): Secondary | ICD-10-CM | POA: Diagnosis not present

## 2021-12-29 DIAGNOSIS — E46 Unspecified protein-calorie malnutrition: Secondary | ICD-10-CM | POA: Diagnosis not present

## 2021-12-29 DIAGNOSIS — R627 Adult failure to thrive: Secondary | ICD-10-CM | POA: Diagnosis not present

## 2021-12-29 DIAGNOSIS — R1311 Dysphagia, oral phase: Secondary | ICD-10-CM | POA: Diagnosis not present

## 2021-12-29 DIAGNOSIS — R279 Unspecified lack of coordination: Secondary | ICD-10-CM | POA: Diagnosis not present

## 2022-01-01 DIAGNOSIS — R627 Adult failure to thrive: Secondary | ICD-10-CM | POA: Diagnosis not present

## 2022-01-01 DIAGNOSIS — R262 Difficulty in walking, not elsewhere classified: Secondary | ICD-10-CM | POA: Diagnosis not present

## 2022-01-01 DIAGNOSIS — M6281 Muscle weakness (generalized): Secondary | ICD-10-CM | POA: Diagnosis not present

## 2022-01-01 DIAGNOSIS — E46 Unspecified protein-calorie malnutrition: Secondary | ICD-10-CM | POA: Diagnosis not present

## 2022-01-01 DIAGNOSIS — R1311 Dysphagia, oral phase: Secondary | ICD-10-CM | POA: Diagnosis not present

## 2022-01-01 DIAGNOSIS — R41841 Cognitive communication deficit: Secondary | ICD-10-CM | POA: Diagnosis not present

## 2022-01-01 DIAGNOSIS — R69 Illness, unspecified: Secondary | ICD-10-CM | POA: Diagnosis not present

## 2022-01-01 DIAGNOSIS — R279 Unspecified lack of coordination: Secondary | ICD-10-CM | POA: Diagnosis not present

## 2022-01-02 DIAGNOSIS — R279 Unspecified lack of coordination: Secondary | ICD-10-CM | POA: Diagnosis not present

## 2022-01-02 DIAGNOSIS — R41841 Cognitive communication deficit: Secondary | ICD-10-CM | POA: Diagnosis not present

## 2022-01-02 DIAGNOSIS — R69 Illness, unspecified: Secondary | ICD-10-CM | POA: Diagnosis not present

## 2022-01-02 DIAGNOSIS — R1311 Dysphagia, oral phase: Secondary | ICD-10-CM | POA: Diagnosis not present

## 2022-01-02 DIAGNOSIS — R262 Difficulty in walking, not elsewhere classified: Secondary | ICD-10-CM | POA: Diagnosis not present

## 2022-01-02 DIAGNOSIS — E46 Unspecified protein-calorie malnutrition: Secondary | ICD-10-CM | POA: Diagnosis not present

## 2022-01-02 DIAGNOSIS — R627 Adult failure to thrive: Secondary | ICD-10-CM | POA: Diagnosis not present

## 2022-01-02 DIAGNOSIS — M6281 Muscle weakness (generalized): Secondary | ICD-10-CM | POA: Diagnosis not present

## 2022-01-03 DIAGNOSIS — R1311 Dysphagia, oral phase: Secondary | ICD-10-CM | POA: Diagnosis not present

## 2022-01-03 DIAGNOSIS — E46 Unspecified protein-calorie malnutrition: Secondary | ICD-10-CM | POA: Diagnosis not present

## 2022-01-03 DIAGNOSIS — R69 Illness, unspecified: Secondary | ICD-10-CM | POA: Diagnosis not present

## 2022-01-03 DIAGNOSIS — R627 Adult failure to thrive: Secondary | ICD-10-CM | POA: Diagnosis not present

## 2022-01-03 DIAGNOSIS — R262 Difficulty in walking, not elsewhere classified: Secondary | ICD-10-CM | POA: Diagnosis not present

## 2022-01-03 DIAGNOSIS — R279 Unspecified lack of coordination: Secondary | ICD-10-CM | POA: Diagnosis not present

## 2022-01-03 DIAGNOSIS — R41841 Cognitive communication deficit: Secondary | ICD-10-CM | POA: Diagnosis not present

## 2022-01-03 DIAGNOSIS — M6281 Muscle weakness (generalized): Secondary | ICD-10-CM | POA: Diagnosis not present

## 2022-01-04 DIAGNOSIS — R1311 Dysphagia, oral phase: Secondary | ICD-10-CM | POA: Diagnosis not present

## 2022-01-04 DIAGNOSIS — R69 Illness, unspecified: Secondary | ICD-10-CM | POA: Diagnosis not present

## 2022-01-04 DIAGNOSIS — R279 Unspecified lack of coordination: Secondary | ICD-10-CM | POA: Diagnosis not present

## 2022-01-04 DIAGNOSIS — R41841 Cognitive communication deficit: Secondary | ICD-10-CM | POA: Diagnosis not present

## 2022-01-04 DIAGNOSIS — R627 Adult failure to thrive: Secondary | ICD-10-CM | POA: Diagnosis not present

## 2022-01-04 DIAGNOSIS — M6281 Muscle weakness (generalized): Secondary | ICD-10-CM | POA: Diagnosis not present

## 2022-01-04 DIAGNOSIS — R262 Difficulty in walking, not elsewhere classified: Secondary | ICD-10-CM | POA: Diagnosis not present

## 2022-01-04 DIAGNOSIS — E46 Unspecified protein-calorie malnutrition: Secondary | ICD-10-CM | POA: Diagnosis not present

## 2022-01-05 DIAGNOSIS — R262 Difficulty in walking, not elsewhere classified: Secondary | ICD-10-CM | POA: Diagnosis not present

## 2022-01-05 DIAGNOSIS — R627 Adult failure to thrive: Secondary | ICD-10-CM | POA: Diagnosis not present

## 2022-01-05 DIAGNOSIS — E46 Unspecified protein-calorie malnutrition: Secondary | ICD-10-CM | POA: Diagnosis not present

## 2022-01-05 DIAGNOSIS — R69 Illness, unspecified: Secondary | ICD-10-CM | POA: Diagnosis not present

## 2022-01-05 DIAGNOSIS — R1311 Dysphagia, oral phase: Secondary | ICD-10-CM | POA: Diagnosis not present

## 2022-01-05 DIAGNOSIS — R41841 Cognitive communication deficit: Secondary | ICD-10-CM | POA: Diagnosis not present

## 2022-01-05 DIAGNOSIS — M6281 Muscle weakness (generalized): Secondary | ICD-10-CM | POA: Diagnosis not present

## 2022-01-05 DIAGNOSIS — R279 Unspecified lack of coordination: Secondary | ICD-10-CM | POA: Diagnosis not present

## 2022-01-08 DIAGNOSIS — R1311 Dysphagia, oral phase: Secondary | ICD-10-CM | POA: Diagnosis not present

## 2022-01-08 DIAGNOSIS — R69 Illness, unspecified: Secondary | ICD-10-CM | POA: Diagnosis not present

## 2022-01-08 DIAGNOSIS — E46 Unspecified protein-calorie malnutrition: Secondary | ICD-10-CM | POA: Diagnosis not present

## 2022-01-08 DIAGNOSIS — R627 Adult failure to thrive: Secondary | ICD-10-CM | POA: Diagnosis not present

## 2022-01-08 DIAGNOSIS — M6281 Muscle weakness (generalized): Secondary | ICD-10-CM | POA: Diagnosis not present

## 2022-01-08 DIAGNOSIS — R41841 Cognitive communication deficit: Secondary | ICD-10-CM | POA: Diagnosis not present

## 2022-01-08 DIAGNOSIS — R262 Difficulty in walking, not elsewhere classified: Secondary | ICD-10-CM | POA: Diagnosis not present

## 2022-01-08 DIAGNOSIS — R279 Unspecified lack of coordination: Secondary | ICD-10-CM | POA: Diagnosis not present

## 2022-01-09 DIAGNOSIS — R1311 Dysphagia, oral phase: Secondary | ICD-10-CM | POA: Diagnosis not present

## 2022-01-09 DIAGNOSIS — E46 Unspecified protein-calorie malnutrition: Secondary | ICD-10-CM | POA: Diagnosis not present

## 2022-01-09 DIAGNOSIS — R69 Illness, unspecified: Secondary | ICD-10-CM | POA: Diagnosis not present

## 2022-01-09 DIAGNOSIS — R262 Difficulty in walking, not elsewhere classified: Secondary | ICD-10-CM | POA: Diagnosis not present

## 2022-01-09 DIAGNOSIS — R627 Adult failure to thrive: Secondary | ICD-10-CM | POA: Diagnosis not present

## 2022-01-09 DIAGNOSIS — R41841 Cognitive communication deficit: Secondary | ICD-10-CM | POA: Diagnosis not present

## 2022-01-09 DIAGNOSIS — M6281 Muscle weakness (generalized): Secondary | ICD-10-CM | POA: Diagnosis not present

## 2022-01-09 DIAGNOSIS — R279 Unspecified lack of coordination: Secondary | ICD-10-CM | POA: Diagnosis not present

## 2022-01-10 DIAGNOSIS — R41841 Cognitive communication deficit: Secondary | ICD-10-CM | POA: Diagnosis not present

## 2022-01-10 DIAGNOSIS — R279 Unspecified lack of coordination: Secondary | ICD-10-CM | POA: Diagnosis not present

## 2022-01-10 DIAGNOSIS — E46 Unspecified protein-calorie malnutrition: Secondary | ICD-10-CM | POA: Diagnosis not present

## 2022-01-10 DIAGNOSIS — R69 Illness, unspecified: Secondary | ICD-10-CM | POA: Diagnosis not present

## 2022-01-10 DIAGNOSIS — R262 Difficulty in walking, not elsewhere classified: Secondary | ICD-10-CM | POA: Diagnosis not present

## 2022-01-10 DIAGNOSIS — R1311 Dysphagia, oral phase: Secondary | ICD-10-CM | POA: Diagnosis not present

## 2022-01-10 DIAGNOSIS — R627 Adult failure to thrive: Secondary | ICD-10-CM | POA: Diagnosis not present

## 2022-01-10 DIAGNOSIS — M6281 Muscle weakness (generalized): Secondary | ICD-10-CM | POA: Diagnosis not present

## 2022-01-11 DIAGNOSIS — I110 Hypertensive heart disease with heart failure: Secondary | ICD-10-CM | POA: Diagnosis not present

## 2022-01-11 DIAGNOSIS — R262 Difficulty in walking, not elsewhere classified: Secondary | ICD-10-CM | POA: Diagnosis not present

## 2022-01-11 DIAGNOSIS — F02818 Dementia in other diseases classified elsewhere, unspecified severity, with other behavioral disturbance: Secondary | ICD-10-CM | POA: Diagnosis not present

## 2022-01-11 DIAGNOSIS — R627 Adult failure to thrive: Secondary | ICD-10-CM | POA: Diagnosis not present

## 2022-01-11 DIAGNOSIS — R531 Weakness: Secondary | ICD-10-CM | POA: Diagnosis not present

## 2022-01-11 DIAGNOSIS — R279 Unspecified lack of coordination: Secondary | ICD-10-CM | POA: Diagnosis not present

## 2022-01-11 DIAGNOSIS — R69 Illness, unspecified: Secondary | ICD-10-CM | POA: Diagnosis not present

## 2022-01-11 DIAGNOSIS — M6281 Muscle weakness (generalized): Secondary | ICD-10-CM | POA: Diagnosis not present

## 2022-01-12 DIAGNOSIS — R279 Unspecified lack of coordination: Secondary | ICD-10-CM | POA: Diagnosis not present

## 2022-01-12 DIAGNOSIS — R262 Difficulty in walking, not elsewhere classified: Secondary | ICD-10-CM | POA: Diagnosis not present

## 2022-01-12 DIAGNOSIS — R627 Adult failure to thrive: Secondary | ICD-10-CM | POA: Diagnosis not present

## 2022-01-12 DIAGNOSIS — M6281 Muscle weakness (generalized): Secondary | ICD-10-CM | POA: Diagnosis not present

## 2022-01-15 DIAGNOSIS — R262 Difficulty in walking, not elsewhere classified: Secondary | ICD-10-CM | POA: Diagnosis not present

## 2022-01-15 DIAGNOSIS — M6281 Muscle weakness (generalized): Secondary | ICD-10-CM | POA: Diagnosis not present

## 2022-01-15 DIAGNOSIS — R627 Adult failure to thrive: Secondary | ICD-10-CM | POA: Diagnosis not present

## 2022-01-15 DIAGNOSIS — R079 Chest pain, unspecified: Secondary | ICD-10-CM | POA: Diagnosis not present

## 2022-01-15 DIAGNOSIS — R279 Unspecified lack of coordination: Secondary | ICD-10-CM | POA: Diagnosis not present

## 2022-01-16 DIAGNOSIS — R262 Difficulty in walking, not elsewhere classified: Secondary | ICD-10-CM | POA: Diagnosis not present

## 2022-01-16 DIAGNOSIS — M6281 Muscle weakness (generalized): Secondary | ICD-10-CM | POA: Diagnosis not present

## 2022-01-16 DIAGNOSIS — R279 Unspecified lack of coordination: Secondary | ICD-10-CM | POA: Diagnosis not present

## 2022-01-16 DIAGNOSIS — R627 Adult failure to thrive: Secondary | ICD-10-CM | POA: Diagnosis not present

## 2022-01-17 DIAGNOSIS — R262 Difficulty in walking, not elsewhere classified: Secondary | ICD-10-CM | POA: Diagnosis not present

## 2022-01-17 DIAGNOSIS — R627 Adult failure to thrive: Secondary | ICD-10-CM | POA: Diagnosis not present

## 2022-01-17 DIAGNOSIS — M6281 Muscle weakness (generalized): Secondary | ICD-10-CM | POA: Diagnosis not present

## 2022-01-17 DIAGNOSIS — R279 Unspecified lack of coordination: Secondary | ICD-10-CM | POA: Diagnosis not present

## 2022-01-23 DIAGNOSIS — R69 Illness, unspecified: Secondary | ICD-10-CM | POA: Diagnosis not present

## 2022-01-23 DIAGNOSIS — F411 Generalized anxiety disorder: Secondary | ICD-10-CM | POA: Diagnosis not present

## 2022-01-23 DIAGNOSIS — F331 Major depressive disorder, recurrent, moderate: Secondary | ICD-10-CM | POA: Diagnosis not present

## 2022-01-23 DIAGNOSIS — F03B3 Unspecified dementia, moderate, with mood disturbance: Secondary | ICD-10-CM | POA: Diagnosis not present

## 2022-02-08 DIAGNOSIS — S70311A Abrasion, right thigh, initial encounter: Secondary | ICD-10-CM | POA: Diagnosis not present

## 2022-02-08 DIAGNOSIS — S81801D Unspecified open wound, right lower leg, subsequent encounter: Secondary | ICD-10-CM | POA: Diagnosis not present

## 2022-03-22 ENCOUNTER — Ambulatory Visit (INDEPENDENT_AMBULATORY_CARE_PROVIDER_SITE_OTHER): Payer: Medicare Other | Admitting: Neurology

## 2022-03-22 ENCOUNTER — Encounter: Payer: Self-pay | Admitting: Neurology

## 2022-03-22 VITALS — BP 137/80 | HR 73 | Ht 63.0 in | Wt 144.5 lb

## 2022-03-22 DIAGNOSIS — G40909 Epilepsy, unspecified, not intractable, without status epilepticus: Secondary | ICD-10-CM
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
## 2022-03-22 NOTE — Patient Instructions (Addendum)
Routine EEG, will contact you to go over the results  Continue current medications  Her last head CT showed right maxillary and ethmoid mucosal thickening, DDX include inspissated mucus vs fungal infection. Needs further work up by PMD  Return as needed

## 2022-03-29 ENCOUNTER — Ambulatory Visit (INDEPENDENT_AMBULATORY_CARE_PROVIDER_SITE_OTHER): Payer: Medicare Other | Admitting: Neurology

## 2022-03-29 DIAGNOSIS — G40909 Epilepsy, unspecified, not intractable, without status epilepticus: Secondary | ICD-10-CM | POA: Diagnosis not present

## 2022-03-29 NOTE — Procedures (Signed)
    History:  76 year old woman with first seizure  EEG classification: Awake and drowsy  Description of the recording: The background rhythms of this recording consists of a symmetric theta activity. As the record progresses, the patient appears to remain in the waking state throughout the recording. Photic stimulation was performed, did not show any abnormalities. Hyperventilation was not performed. Toward the end of the recording, the patient enters the drowsy state with slight symmetric slowing seen. The patient never enters stage II sleep. No abnormal epileptiform discharges seen during this recording. There was no focal slowing. EKG monitor shows no evidence of cardiac rhythm abnormalities with a heart rate of 60.  Abnormality: Mild diffuse slowing   Impression: This is an abnormal EEG recording in the waking and drowsy state due to mild diffuse slowing. Diffuse slowing is consistent with a generalized brain dysfunction, nonspecific.    Alric Ran, MD [facility]

## 2022-03-30 NOTE — Progress Notes (Signed)
Please call and inform patient that her recent EEG (Brain wave test) showed mild diffuse slowing which is a non specific finding.  No further action is required on this test at this time but please call us if you do have another seizure or seizure like events.  Thanks,   Alric Ran, MD

## 2022-04-02 ENCOUNTER — Telehealth: Payer: Self-pay

## 2022-04-02 NOTE — Telephone Encounter (Signed)
-----   Message from Alric Ran, MD sent at 03/30/2022  8:12 AM EDT ----- Please call and inform patient that her recent EEG (Brain wave test) showed mild diffuse slowing which is a non specific finding.  No further action is required on this test at this time but please call us if you do have another seizure or seizure like events.  Thanks,   Alric Ran, MD

## 2022-04-02 NOTE — Telephone Encounter (Signed)
Results faxed to nursing home

## 2023-06-03 ENCOUNTER — Encounter (INDEPENDENT_AMBULATORY_CARE_PROVIDER_SITE_OTHER): Payer: Self-pay

## 2023-06-03 ENCOUNTER — Encounter (INDEPENDENT_AMBULATORY_CARE_PROVIDER_SITE_OTHER): Payer: Self-pay | Admitting: Gastroenterology

## 2023-06-03 ENCOUNTER — Ambulatory Visit (INDEPENDENT_AMBULATORY_CARE_PROVIDER_SITE_OTHER): Payer: Medicare Other | Admitting: Gastroenterology

## 2023-06-03 VITALS — BP 95/57 | HR 78 | Temp 98.7°F | Ht 62.0 in | Wt 141.1 lb

## 2023-06-03 DIAGNOSIS — R1319 Other dysphagia: Secondary | ICD-10-CM

## 2023-06-03 DIAGNOSIS — R12 Heartburn: Secondary | ICD-10-CM | POA: Diagnosis not present

## 2023-06-03 DIAGNOSIS — R1310 Dysphagia, unspecified: Secondary | ICD-10-CM | POA: Insufficient documentation

## 2023-06-03 DIAGNOSIS — T17308A Unspecified foreign body in larynx causing other injury, initial encounter: Secondary | ICD-10-CM | POA: Insufficient documentation

## 2023-06-03 MED ORDER — OMEPRAZOLE 40 MG PO CPDR: 40.0000 mg | DELAYED_RELEASE_CAPSULE | Freq: Every day | ORAL | 3 refills | Status: AC
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
## 2023-06-03 NOTE — Patient Instructions (Addendum)
Schedule EGD Schedule barium esophagram with pill Start omeprazole 40 mg qday
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
## 2023-06-04 ENCOUNTER — Telehealth (INDEPENDENT_AMBULATORY_CARE_PROVIDER_SITE_OTHER): Payer: Self-pay | Admitting: Gastroenterology

## 2023-06-04 ENCOUNTER — Encounter (INDEPENDENT_AMBULATORY_CARE_PROVIDER_SITE_OTHER): Payer: Self-pay

## 2023-06-04 NOTE — Telephone Encounter (Signed)
Amy from [facility] left message needing me to return her call. Returned call but had to leave voicemail

## 2023-06-12 ENCOUNTER — Encounter (HOSPITAL_COMMUNITY): Payer: Self-pay

## 2023-06-12 ENCOUNTER — Ambulatory Visit (HOSPITAL_COMMUNITY)
Admission: RE | Admit: 2023-06-12 | Discharge: 2023-06-12 | Disposition: A | Discharge status: Home / Self Care | Payer: Medicare Other | Source: Ambulatory Visit | Attending: Gastroenterology | Admitting: Gastroenterology

## 2023-06-12 DIAGNOSIS — R1319 Other dysphagia: Secondary | ICD-10-CM | POA: Insufficient documentation

## 2023-06-12 DIAGNOSIS — T17308A Unspecified foreign body in larynx causing other injury, initial encounter: Secondary | ICD-10-CM | POA: Diagnosis present

## 2023-06-12 NOTE — Patient Instructions (Signed)
Brittney Williams  06/12/2023     @PREFPERIOPPHARMACY @   Your procedure is scheduled on 06/18/2023.  Report to [facility] at 8:45 A.M.  Call this number if you have problems the morning of surgery:  [phone]  If you experience any cold or flu symptoms such as cough, fever, chills, shortness of breath, etc. between now and your scheduled surgery, please notify us at the above number.   Remember:   Please follow the diet instructions given to you by Dr Wilburt Finlay office.    You may drink clear liquids until 6:45 AM .    Clear liquids allowed are:                    Water, Carbonated beverages (diabetics please choose diet or no sugar options), Clear Tea (No creamer, milk, or cream, including half & half and powdered creamer), Black Coffee Only (No creamer, milk or cream, including half & half and powdered creamer), and Clear Sports drink (No red color; diabetics please choose diet or no sugar options)    Take these medicines the morning of surgery with A SIP OF WATER : Coreg Prozac Synthroid Prilosec and you may take your Ultram if needed.     Do not wear jewelry, make-up or nail polish, including gel polish,  artificial nails, or any other type of covering on natural nails (fingers and  toes).  Do not wear lotions, powders, or perfumes, or deodorant.  Do not shave 48 hours prior to surgery.  Men may shave face and neck.  Do not bring valuables to the hospital.  [facility] is not responsible for any belongings or valuables.  Contacts, dentures or bridgework may not be worn into surgery.  Leave your suitcase in the car.  After surgery it may be brought to your room.  For patients admitted to the hospital, discharge time will be determined by your treatment team.  Patients discharged the day of surgery will not be allowed to drive home.   Name and phone number of your driver:   Family Special instructions:  N/A  Please read over the following fact sheets that you were  given. Care and Recovery After Surgery  Upper Endoscopy, Adult Upper endoscopy is a procedure to look inside the upper GI (gastrointestinal) tract. The upper GI tract is made up of: The esophagus. This is the part of the body that moves food from your mouth to your stomach. The stomach. The duodenum. This is the first part of your small intestine. This procedure is also called esophagogastroduodenoscopy (EGD) or gastroscopy. In this procedure, your health care provider passes a thin, flexible tube (endoscope) through your mouth and down your esophagus into your stomach and into your duodenum. A small camera is attached to the end of the tube. Images from the camera appear on a monitor in the exam room. During this procedure, your health care provider may also remove a small piece of tissue to be sent to a lab and examined under a microscope (biopsy). Your health care provider may do an upper endoscopy to diagnose cancers of the upper GI tract. You may also have this procedure to find the cause of other conditions, such as: Stomach pain. Heartburn. Pain or problems when swallowing. Nausea and vomiting. Stomach bleeding. Stomach ulcers. Tell a health care provider about: Any allergies you have. All medicines you are taking, including vitamins, herbs, eye drops, creams, and over-the-counter medicines. Any problems you or family members have had  with anesthetic medicines. Any bleeding problems you have. Any surgeries you have had. Any medical conditions you have. Whether you are pregnant or may be pregnant. What are the risks? Your healthcare provider will talk with you about risks. These may include: Infection. Bleeding. Allergic reactions to medicines. A tear or hole (perforation) in the esophagus, stomach, or duodenum. What happens before the procedure? When to stop eating and drinking Follow instructions from your health care provider about what you may eat and drink. These may  include: 8 hours before your procedure Stop eating most foods. Do not eat meat, fried foods, or fatty foods. Eat only light foods, such as toast or crackers. All liquids are okay except energy drinks and alcohol. 6 hours before your procedure Stop eating. Drink only clear liquids, such as water, clear fruit juice, black coffee, plain tea, and sports drinks. Do not drink energy drinks or alcohol. 2 hours before your procedure Stop drinking all liquids. You may be allowed to take medicines with small sips of water. If you do not follow your health care provider's instructions, your procedure may be delayed or canceled. Medicines Ask your health care provider about: Changing or stopping your regular medicines. This is especially important if you are taking diabetes medicines or blood thinners. Taking medicines such as aspirin and ibuprofen. These medicines can thin your blood. Do not take these medicines unless your health care provider tells you to take them. Taking over-the-counter medicines, vitamins, herbs, and supplements. General instructions If you will be going home right after the procedure, plan to have a responsible adult: Take you home from the hospital or clinic. You will not be allowed to drive. Care for you for the time you are told. What happens during the procedure?  An IV will be inserted into one of your veins. You may be given one or more of the following: A medicine to help you relax (sedative). A medicine to numb the throat (local anesthetic). You will lie on your left side on an exam table. Your health care provider will pass the endoscope through your mouth and down your esophagus. Your health care provider will use the scope to check the inside of your esophagus, stomach, and duodenum. Biopsies may be taken. The endoscope will be removed. The procedure may vary among health care providers and hospitals. What happens after the procedure? Your blood pressure,  heart rate, breathing rate, and blood oxygen level will be monitored until you leave the hospital or clinic. When your throat is no longer numb, you may be given some fluids to drink. If you were given a sedative during the procedure, it can affect you for several hours. Do not drive or operate machinery until your health care provider says that it is safe. It is up to you to get the results of your procedure. Ask your health care provider, or the department that is doing the procedure, when your results will be ready. Contact a health care provider if you: Have a sore throat that lasts longer than 1 day. Have a fever. Get help right away if you: Vomit blood or your vomit looks like coffee grounds. Have bloody, black, or tarry stools. Have a very bad sore throat or you cannot swallow. Have difficulty breathing or very bad pain in your chest or abdomen. These symptoms may be an emergency. Get help right away. Call 911. Do not wait to see if the symptoms will go away. Do not drive yourself to the hospital. Summary Upper  endoscopy is a procedure to look inside the upper GI tract. During the procedure, an IV will be inserted into one of your veins. You may be given a medicine to help you relax. The endoscope will be passed through your mouth and down your esophagus. Follow instructions from your health care provider about what you can eat and drink. This information is not intended to replace advice given to you by your health care provider. Make sure you discuss any questions you have with your health care provider. Document Revised: 11/08/2021 Document Reviewed: 11/08/2021 Elsevier Patient Education  2024 [employer]  Monitored Anesthesia Care Anesthesia refers to the techniques, procedures, and medicines that help a person stay safe and comfortable during surgery. Monitored anesthesia care, or sedation, is one type of anesthesia. You may have sedation if you do not need to be asleep for  your procedure. Procedures that use sedation may include: Surgery to remove cataracts from your eyes. A dental procedure. A biopsy. This is when a tissue sample is removed and looked at under a microscope. You will be watched closely during your procedure. Your level of sedation or type of anesthesia may be changed to fit your needs. Tell a health care provider about: Any allergies you have. All medicines you are taking, including vitamins, herbs, eye drops, creams, and over-the-counter medicines. Any problems you or family members have had with anesthesia. Any bleeding problems you have. Any surgeries you have had. Any medical conditions or illnesses you have. This includes sleep apnea, cough, fever, or the flu. Whether you are pregnant or may be pregnant. Whether you use cigarettes, alcohol, or drugs. Any use of steroids, whether by mouth or as a cream. What are the risks? Your health care provider will talk with you about risks. These may include: Getting too much medicine (oversedation). Nausea. Allergic reactions to medicines. Trouble breathing. If this happens, a breathing tube may be used to help you breathe. It will be removed when you are awake and breathing on your own. Heart trouble. Lung trouble. Confusion that gets better with time (emergence delirium). What happens before the procedure? When to stop eating and drinking Follow instructions from your health care provider about what you may eat and drink. These may include: 8 hours before your procedure Stop eating most foods. Do not eat meat, fried foods, or fatty foods. Eat only light foods, such as toast or crackers. All liquids are okay except energy drinks and alcohol. 6 hours before your procedure Stop eating. Drink only clear liquids, such as water, clear fruit juice, black coffee, plain tea, and sports drinks. Do not drink energy drinks or alcohol. 2 hours before your procedure Stop drinking all liquids. You  may be allowed to take medicines with small sips of water. If you do not follow your health care provider's instructions, your procedure may be delayed or canceled. Medicines Ask your health care provider about: Changing or stopping your regular medicines. These include any diabetes medicines or blood thinners you take. Taking medicines such as aspirin and ibuprofen. These medicines can thin your blood. Do not take them unless your health care provider tells you to. Taking over-the-counter medicines, vitamins, herbs, and supplements. Testing You may have an exam or testing. You may have a blood or urine sample taken. General instructions Do not use any products that contain nicotine or tobacco for at least 4 weeks before the procedure. These products include cigarettes, chewing tobacco, and vaping devices, such as e-cigarettes. If you need help  quitting, ask your health care provider. If you will be going home right after the procedure, plan to have a responsible adult: Take you home from the hospital or clinic. You will not be allowed to drive. Care for you for the time you are told. What happens during the procedure?  Your blood pressure, heart rate, breathing, level of pain, and blood oxygen level will be monitored. An IV will be inserted into one of your veins. You may be given: A sedative. This helps you relax. Anesthesia. This will: Numb certain areas of your body. Make you fall asleep for surgery. You will be given medicines as needed to keep you comfortable. The more medicine you are given, the deeper your level of sedation will be. Your level of sedation may be changed to fit your needs. There are three levels of sedation: Mild sedation. At this level, you may feel awake and relaxed. You will be able to follow directions. Moderate sedation. At this level, you will be sleepy. You may not remember the procedure. Deep sedation. At this level, you will be asleep. You will not remember  the procedure. How you get the medicines will depend on your age and the procedure. They may be given as: A pill. This may be taken by mouth (orally) or inserted into the rectum. An injection. This may be into a vein or muscle. A spray through the nose. After your procedure is over, the medicine will be stopped. The procedure may vary among health care providers and hospitals. What happens after the procedure? Your blood pressure, heart rate, breathing rate, and blood oxygen level will be monitored until you leave the hospital or clinic. You may feel sleepy, clumsy, or nauseous. You may not remember what happened during or after the procedure. Sedation can affect you for several hours. Do not drive or use machinery until your health care provider says that it is safe. This information is not intended to replace advice given to you by your health care provider. Make sure you discuss any questions you have with your health care provider. Document Revised: 12/25/2021 Document Reviewed: 12/25/2021 Elsevier Patient Education  2024 [employer].

## 2023-06-13 ENCOUNTER — Encounter (HOSPITAL_COMMUNITY)
Admission: RE | Admit: 2023-06-13 | Discharge: 2023-06-13 | Disposition: A | Discharge status: Home / Self Care | Payer: Medicare Other | Source: Ambulatory Visit | Attending: Gastroenterology | Admitting: Gastroenterology

## 2023-06-13 HISTORY — DX: Unspecified convulsions: R56.9

## 2023-06-18 ENCOUNTER — Encounter (HOSPITAL_COMMUNITY): Payer: Self-pay | Admitting: Gastroenterology

## 2023-06-18 ENCOUNTER — Ambulatory Visit (HOSPITAL_COMMUNITY)
Admission: RE | Admit: 2023-06-18 | Discharge: 2023-06-18 | Disposition: A | Discharge status: Home / Self Care | Payer: Medicare Other | Attending: Gastroenterology | Admitting: Gastroenterology

## 2023-06-18 ENCOUNTER — Ambulatory Visit (HOSPITAL_BASED_OUTPATIENT_CLINIC_OR_DEPARTMENT_OTHER): Payer: Medicare Other | Admitting: Certified Registered"

## 2023-06-18 ENCOUNTER — Encounter (HOSPITAL_COMMUNITY)
Admission: RE | Disposition: A | Discharge status: Home / Self Care | Payer: Self-pay | Source: Home / Self Care | Attending: Gastroenterology

## 2023-06-18 ENCOUNTER — Ambulatory Visit (HOSPITAL_COMMUNITY): Payer: Medicare Other | Admitting: Certified Registered"

## 2023-06-18 DIAGNOSIS — I509 Heart failure, unspecified: Secondary | ICD-10-CM | POA: Diagnosis not present

## 2023-06-18 DIAGNOSIS — Z79899 Other long term (current) drug therapy: Secondary | ICD-10-CM | POA: Diagnosis not present

## 2023-06-18 DIAGNOSIS — R1310 Dysphagia, unspecified: Secondary | ICD-10-CM | POA: Insufficient documentation

## 2023-06-18 DIAGNOSIS — F0393 Unspecified dementia, unspecified severity, with mood disturbance: Secondary | ICD-10-CM | POA: Diagnosis not present

## 2023-06-18 DIAGNOSIS — K2980 Duodenitis without bleeding: Secondary | ICD-10-CM | POA: Diagnosis not present

## 2023-06-18 DIAGNOSIS — E039 Hypothyroidism, unspecified: Secondary | ICD-10-CM | POA: Insufficient documentation

## 2023-06-18 DIAGNOSIS — Z87891 Personal history of nicotine dependence: Secondary | ICD-10-CM | POA: Insufficient documentation

## 2023-06-18 DIAGNOSIS — F32A Depression, unspecified: Secondary | ICD-10-CM | POA: Insufficient documentation

## 2023-06-18 DIAGNOSIS — I110 Hypertensive heart disease with heart failure: Secondary | ICD-10-CM | POA: Insufficient documentation

## 2023-06-18 DIAGNOSIS — F419 Anxiety disorder, unspecified: Secondary | ICD-10-CM | POA: Diagnosis not present

## 2023-06-18 DIAGNOSIS — K259 Gastric ulcer, unspecified as acute or chronic, without hemorrhage or perforation: Secondary | ICD-10-CM

## 2023-06-18 DIAGNOSIS — K222 Esophageal obstruction: Secondary | ICD-10-CM

## 2023-06-18 DIAGNOSIS — K219 Gastro-esophageal reflux disease without esophagitis: Secondary | ICD-10-CM | POA: Insufficient documentation

## 2023-06-18 DIAGNOSIS — K449 Diaphragmatic hernia without obstruction or gangrene: Secondary | ICD-10-CM | POA: Diagnosis not present

## 2023-06-18 DIAGNOSIS — K31A0 Gastric intestinal metaplasia, unspecified: Secondary | ICD-10-CM

## 2023-06-18 DIAGNOSIS — F0394 Unspecified dementia, unspecified severity, with anxiety: Secondary | ICD-10-CM | POA: Diagnosis not present

## 2023-06-18 HISTORY — PX: BIOPSY: SHX5522

## 2023-06-18 HISTORY — PX: ESOPHAGOGASTRODUODENOSCOPY (EGD) WITH PROPOFOL: SHX5813

## 2023-06-18 SURGERY — ESOPHAGOGASTRODUODENOSCOPY (EGD) WITH PROPOFOL
Anesthesia: General

## 2023-06-18 MED ORDER — PROPOFOL 10 MG/ML IV BOLUS
INTRAVENOUS | Status: DC | PRN
  Administered 2023-06-18: 80 mg via INTRAVENOUS
  Administered 2023-06-18: 30 mg via INTRAVENOUS
  Administered 2023-06-18: 20 mg via INTRAVENOUS

## 2023-06-18 MED ORDER — PHENYLEPHRINE 80 MCG/ML (10ML) SYRINGE FOR IV PUSH (FOR BLOOD PRESSURE SUPPORT)
PREFILLED_SYRINGE | INTRAVENOUS | Status: DC | PRN
  Administered 2023-06-18 (×3): 160 ug via INTRAVENOUS

## 2023-06-18 MED ORDER — EPHEDRINE 5 MG/ML INJ
INTRAVENOUS | Status: AC
  Filled 2023-06-18: qty 5

## 2023-06-18 MED ORDER — PHENYLEPHRINE 80 MCG/ML (10ML) SYRINGE FOR IV PUSH (FOR BLOOD PRESSURE SUPPORT)
PREFILLED_SYRINGE | INTRAVENOUS | Status: AC
  Filled 2023-06-18: qty 10

## 2023-06-18 MED ORDER — LIDOCAINE HCL (PF) 2 % IJ SOLN
INTRAMUSCULAR | Status: DC | PRN
  Administered 2023-06-18: 50 mg via INTRADERMAL

## 2023-06-18 MED ORDER — LACTATED RINGERS IV SOLN: INTRAVENOUS | Status: DC | PRN

## 2023-06-18 MED ORDER — EPHEDRINE SULFATE-NACL 50-0.9 MG/10ML-% IV SOSY
PREFILLED_SYRINGE | INTRAVENOUS | Status: DC | PRN
  Administered 2023-06-18: 5 mg via INTRAVENOUS

## 2023-06-18 NOTE — Anesthesia Preprocedure Evaluation (Signed)
Anesthesia Evaluation  Patient identified by MRN, date of birth, ID band Patient awake    Reviewed: Allergy & Precautions, H&P , NPO status , Patient's Chart, lab work & pertinent test results, reviewed documented beta blocker date and time   Airway Mallampati: II  TM Distance: >3 FB Neck ROM: full    Dental no notable dental hx.    Pulmonary neg pulmonary ROS, former smoker   Pulmonary exam normal breath sounds clear to auscultation       Cardiovascular Exercise Tolerance: Good hypertension, +CHF  negative cardio ROS  Rhythm:regular Rate:Normal     Neuro/Psych Seizures -,  PSYCHIATRIC DISORDERS Anxiety Depression    negative neurological ROS  negative psych ROS   GI/Hepatic negative GI ROS, Neg liver ROS,GERD  ,,  Endo/Other  negative endocrine ROS    Renal/GU negative Renal ROS  negative genitourinary   Musculoskeletal   Abdominal   Peds  Hematology negative hematology ROS (+)   Anesthesia Other Findings   Reproductive/Obstetrics negative OB ROS                             Anesthesia Physical Anesthesia Plan  ASA: 2  Anesthesia Plan: General   Post-op Pain Management:    Induction:   PONV Risk Score and Plan: Propofol infusion  Airway Management Planned:   Additional Equipment:   Intra-op Plan:   Post-operative Plan:   Informed Consent: I have reviewed the patients History and Physical, chart, labs and discussed the procedure including the risks, benefits and alternatives for the proposed anesthesia with the patient or authorized representative who has indicated his/her understanding and acceptance.     Dental Advisory Given  Plan Discussed with: CRNA  Anesthesia Plan Comments:        Anesthesia Quick Evaluation

## 2023-06-18 NOTE — Anesthesia Procedure Notes (Signed)
Date/Time: 06/18/2023 10:48 AM  Performed by: Julian Reil, CRNAPre-anesthesia Checklist: Patient identified, Emergency Drugs available, Suction available and Patient being monitored Patient Re-evaluated:Patient Re-evaluated prior to induction Oxygen Delivery Method: Nasal cannula Induction Type: IV induction Placement Confirmation: positive ETCO2

## 2023-06-18 NOTE — Discharge Instructions (Signed)
You are being discharged to home.  ?Resume your previous diet.  ?We are waiting for your pathology results.  ?Continue your present medications.  ?

## 2023-06-18 NOTE — Transfer of Care (Signed)
Immediate Anesthesia Transfer of Care Note  Patient: Brittney Williams  Procedure(s) Performed: ESOPHAGOGASTRODUODENOSCOPY (EGD) WITH PROPOFOL BIOPSY Balloon dilation wire-guided  Patient Location: Short Stay  Anesthesia Type:General  Level of Consciousness: awake  Airway & Oxygen Therapy: Patient Spontanous Breathing  Post-op Assessment: Report given to RN and Post -op Vital signs reviewed and stable  Post vital signs: Reviewed and stable  Last Vitals:  Vitals Value Taken Time  BP    Temp    Pulse    Resp    SpO2      Last Pain:  Vitals:   06/18/23 1049  TempSrc:   PainSc: 0-No pain         Complications: No notable events documented.

## 2023-06-18 NOTE — Op Note (Signed)
[facility] Patient Name: Brittney Williams Procedure Date: 06/18/2023 10:30 AM MRN: [id number] Date of Birth: 14-Jan-1946 Attending MD: [name] , , [id number] CSN: [id number] Age: 77 Admit Type: Outpatient Procedure:                Upper GI endoscopy Indications:              Dysphagia Providers:                [name], Sheran Fava, Kristine L.                            Jessee Avers, Technician Referring MD:              Medicines:                Monitored Anesthesia Care Complications:            No immediate complications. Estimated Blood Loss:     Estimated blood loss: none. Procedure:                Pre-Anesthesia Assessment:                           - Prior to the procedure, a History and Physical                            was performed, and patient medications, allergies                            and sensitivities were reviewed. The patient's                            tolerance of previous anesthesia was reviewed.                           - The risks and benefits of the procedure and the                            sedation options and risks were discussed with the                            patient. All questions were answered and informed                            consent was obtained.                           After obtaining informed consent, the endoscope was                            passed under direct vision. Throughout the                            procedure, the patient's blood pressure, pulse, and                            oxygen saturations were monitored continuously. The  [id number]-H190 ([id number]) scope was introduced through the                            mouth, and advanced to the second part of duodenum.                            The upper GI endoscopy was accomplished without                            difficulty. The patient tolerated the procedure                            well. Scope In: 10:53:26  AM Scope Out: 11:06:09 AM Total Procedure Duration: 0 hours 12 minutes 43 seconds  Findings:      A non-obstructing Schatzki ring was found at the gastroesophageal       junction. A cold forceps was used to disrupt the rim of the ring. A TTS       dilator was passed through the scope. Dilation with a 15-16.5-18 mm       balloon dilator was performed to 18 mm.      A 3 cm hiatal hernia was present.      The entire examined stomach was normal. Biopsies were taken with a cold       forceps for Helicobacter pylori testing.      Localized nodular mucosa was found in the first portion of the duodenum.       Biopsies were taken with a cold forceps for histology. Impression:               - Non-obstructing Schatzki ring. Dilated.                           - 3 cm hiatal hernia.                           - Normal stomach. Biopsied.                           - Nodular mucosa in the first portion of the                            duodenum. Biopsied. Moderate Sedation:      Per Anesthesia Care Recommendation:           - Discharge patient to home (ambulatory).                           - Resume previous diet.                           - Await pathology results.                           - Continue present medications. Procedure Code(s):        --- Professional ---                           [phone], Esophagogastroduodenoscopy, flexible,  transoral; with transendoscopic balloon dilation of                            esophagus (less than 30 mm diameter)                           43239, 59, Esophagogastroduodenoscopy, flexible,                            transoral; with biopsy, single or multiple Diagnosis Code(s):        --- Professional ---                           K22.2, Esophageal obstruction                           K44.9, Diaphragmatic hernia without obstruction or                            gangrene                           K31.89, Other diseases of stomach and  duodenum                           R13.10, Dysphagia, unspecified CPT copyright 2022 American Medical Association. All rights reserved. The codes documented in this report are preliminary and upon coder review may  be revised to meet current compliance requirements. [name], MD [name],  06/18/2023 11:17:21 AM This report has been signed electronically. Number of Addenda: 0

## 2023-06-18 NOTE — Interval H&P Note (Signed)
History and Physical Interval Note:  06/18/2023 10:49 AM  Brittney Williams  has presented today for surgery, with the diagnosis of dysphagia.  The various methods of treatment have been discussed with the patient and family. After consideration of risks, benefits and other options for treatment, the patient has consented to  Procedure(s) with comments: ESOPHAGOGASTRODUODENOSCOPY (EGD) WITH PROPOFOL (N/A) - 10:45am;asa 3, sent message to Tanya start 11:00 as a surgical intervention.  The patient's history has been reviewed, patient examined, no change in status, stable for surgery.  I have reviewed the patient's chart and labs.  Questions were answered to the patient's satisfaction.     [name] Mayorga

## 2023-06-19 LAB — SURGICAL PATHOLOGY

## 2023-06-19 NOTE — Anesthesia Postprocedure Evaluation (Signed)
Anesthesia Post Note  Patient: Brittney Williams  Procedure(s) Performed: ESOPHAGOGASTRODUODENOSCOPY (EGD) WITH PROPOFOL BIOPSY Balloon dilation wire-guided  Patient location during evaluation: Phase II Anesthesia Type: General Level of consciousness: awake Pain management: pain level controlled Vital Signs Assessment: post-procedure vital signs reviewed and stable Respiratory status: spontaneous breathing and respiratory function stable Cardiovascular status: blood pressure returned to baseline and stable Postop Assessment: no headache and no apparent nausea or vomiting Anesthetic complications: no Comments: Late entry   No notable events documented.   Last Vitals:  Vitals:   06/18/23 1110 06/18/23 1116  BP: (!) 122/32 (!) 121/57  Pulse: 80   Resp: 12   Temp: (!) 36.4 C   SpO2: 97%     Last Pain:  Vitals:   06/18/23 1116  TempSrc:   PainSc: 0-No pain                 Windell Norfolk

## 2023-06-20 ENCOUNTER — Encounter (INDEPENDENT_AMBULATORY_CARE_PROVIDER_SITE_OTHER): Payer: Self-pay | Admitting: *Deleted

## 2023-06-24 ENCOUNTER — Encounter (HOSPITAL_COMMUNITY): Payer: Self-pay | Admitting: Gastroenterology

## 2024-06-17 ENCOUNTER — Encounter (INDEPENDENT_AMBULATORY_CARE_PROVIDER_SITE_OTHER): Payer: Self-pay | Admitting: Gastroenterology
